# Patient Record
Sex: Female | Born: 1935
Health system: Southern US, Community
[De-identification: ages and names within clinical notes are randomized; demographics above are authoritative.]

## PROBLEM LIST (undated history)

## (undated) DIAGNOSIS — R7989 Other specified abnormal findings of blood chemistry: Secondary | ICD-10-CM

## (undated) DIAGNOSIS — M949 Disorder of cartilage, unspecified: Secondary | ICD-10-CM

## (undated) DIAGNOSIS — M899 Disorder of bone, unspecified: Secondary | ICD-10-CM

## (undated) DIAGNOSIS — E785 Hyperlipidemia, unspecified: Secondary | ICD-10-CM

## (undated) DIAGNOSIS — F329 Major depressive disorder, single episode, unspecified: Secondary | ICD-10-CM

## (undated) DIAGNOSIS — I729 Aneurysm of unspecified site: Secondary | ICD-10-CM

## (undated) DIAGNOSIS — D649 Anemia, unspecified: Secondary | ICD-10-CM

## (undated) DIAGNOSIS — C4490 Unspecified malignant neoplasm of skin, unspecified: Secondary | ICD-10-CM

## (undated) DIAGNOSIS — J984 Other disorders of lung: Secondary | ICD-10-CM

## (undated) DIAGNOSIS — E039 Hypothyroidism, unspecified: Secondary | ICD-10-CM

## (undated) DIAGNOSIS — I728 Aneurysm of other specified arteries: Secondary | ICD-10-CM

## (undated) HISTORY — DX: Aneurysm of other specified arteries: I72.8

## (undated) HISTORY — DX: Disorder of bone, unspecified: M89.9

## (undated) HISTORY — DX: Other specified abnormal findings of blood chemistry: R79.89

## (undated) HISTORY — DX: Disorder of cartilage, unspecified: M94.9

## (undated) HISTORY — DX: Aneurysm of unspecified site: I72.9

## (undated) HISTORY — DX: Anemia, unspecified: D64.9

## (undated) HISTORY — DX: Other disorders of lung: J98.4

## (undated) HISTORY — DX: Hypothyroidism, unspecified: E03.9

## (undated) HISTORY — DX: Major depressive disorder, single episode, unspecified: F32.9

## (undated) HISTORY — PX: ABDOMINAL HYSTERECTOMY: SHX81

## (undated) HISTORY — DX: Hyperlipidemia, unspecified: E78.5

## (undated) HISTORY — PX: OOPHORECTOMY: SHX86

## (undated) HISTORY — DX: Unspecified malignant neoplasm of skin, unspecified: C44.90

---

## 2001-12-12 ENCOUNTER — Ambulatory Visit (HOSPITAL_COMMUNITY): Admission: RE | Admit: 2001-12-12 | Discharge: 2001-12-12 | Payer: Self-pay | Admitting: Internal Medicine

## 2001-12-12 ENCOUNTER — Encounter: Payer: Self-pay | Admitting: Internal Medicine

## 2002-08-17 ENCOUNTER — Encounter: Admission: RE | Admit: 2002-08-17 | Discharge: 2002-08-17 | Payer: Self-pay | Admitting: *Deleted

## 2002-08-17 ENCOUNTER — Encounter: Payer: Self-pay | Admitting: *Deleted

## 2002-09-21 ENCOUNTER — Encounter: Admission: RE | Admit: 2002-09-21 | Discharge: 2002-09-21 | Payer: Self-pay | Admitting: Internal Medicine

## 2002-09-21 ENCOUNTER — Encounter: Payer: Self-pay | Admitting: Internal Medicine

## 2003-04-05 ENCOUNTER — Encounter: Admission: RE | Admit: 2003-04-05 | Discharge: 2003-04-05 | Payer: Self-pay | Admitting: *Deleted

## 2003-04-05 ENCOUNTER — Encounter: Payer: Self-pay | Admitting: *Deleted

## 2003-10-11 ENCOUNTER — Encounter: Admission: RE | Admit: 2003-10-11 | Discharge: 2003-10-11 | Payer: Self-pay | Admitting: *Deleted

## 2004-09-08 ENCOUNTER — Ambulatory Visit (HOSPITAL_COMMUNITY): Admission: RE | Admit: 2004-09-08 | Discharge: 2004-09-08 | Payer: Self-pay | Admitting: Internal Medicine

## 2004-10-20 ENCOUNTER — Encounter: Admission: RE | Admit: 2004-10-20 | Discharge: 2004-10-20 | Payer: Self-pay | Admitting: Internal Medicine

## 2004-11-06 ENCOUNTER — Ambulatory Visit: Payer: Self-pay | Admitting: Internal Medicine

## 2005-01-16 ENCOUNTER — Ambulatory Visit: Payer: Self-pay | Admitting: Internal Medicine

## 2005-02-13 ENCOUNTER — Ambulatory Visit: Payer: Self-pay | Admitting: Internal Medicine

## 2005-06-07 ENCOUNTER — Ambulatory Visit: Payer: Self-pay | Admitting: Internal Medicine

## 2005-06-12 ENCOUNTER — Ambulatory Visit: Payer: Self-pay | Admitting: Cardiology

## 2005-11-02 ENCOUNTER — Ambulatory Visit: Payer: Self-pay | Admitting: Internal Medicine

## 2006-01-31 ENCOUNTER — Ambulatory Visit: Payer: Self-pay | Admitting: Internal Medicine

## 2006-04-03 ENCOUNTER — Ambulatory Visit: Payer: Self-pay | Admitting: Internal Medicine

## 2006-04-17 ENCOUNTER — Ambulatory Visit: Payer: Self-pay | Admitting: Internal Medicine

## 2006-08-30 ENCOUNTER — Ambulatory Visit: Payer: Self-pay | Admitting: Internal Medicine

## 2006-08-30 ENCOUNTER — Encounter: Payer: Self-pay | Admitting: Internal Medicine

## 2006-08-30 DIAGNOSIS — E039 Hypothyroidism, unspecified: Secondary | ICD-10-CM

## 2006-08-30 DIAGNOSIS — I729 Aneurysm of unspecified site: Secondary | ICD-10-CM

## 2006-08-30 DIAGNOSIS — M899 Disorder of bone, unspecified: Secondary | ICD-10-CM

## 2006-08-30 DIAGNOSIS — M949 Disorder of cartilage, unspecified: Secondary | ICD-10-CM

## 2006-08-30 HISTORY — DX: Disorder of bone, unspecified: M89.9

## 2006-08-30 HISTORY — DX: Aneurysm of unspecified site: I72.9

## 2006-08-30 HISTORY — DX: Hypothyroidism, unspecified: E03.9

## 2006-10-07 ENCOUNTER — Ambulatory Visit: Payer: Self-pay | Admitting: Internal Medicine

## 2006-11-04 ENCOUNTER — Ambulatory Visit: Payer: Self-pay | Admitting: Internal Medicine

## 2006-11-04 LAB — CONVERTED CEMR LAB
ALT: 13 units/L (ref 0–40)
AST: 17 units/L (ref 0–37)
Albumin: 3.5 g/dL (ref 3.5–5.2)
Alkaline Phosphatase: 72 units/L (ref 39–117)
BUN: 10 mg/dL (ref 6–23)
Bilirubin, Direct: 0.1 mg/dL (ref 0.0–0.3)
CO2: 28 meq/L (ref 19–32)
Calcium: 9.1 mg/dL (ref 8.4–10.5)
Chloride: 108 meq/L (ref 96–112)
Creatinine, Ser: 0.6 mg/dL (ref 0.4–1.2)
GFR calc non Af Amer: 103 mL/min
Glomerular Filtration Rate, Af Am: 125 mL/min/{1.73_m2}
Glucose, Bld: 107 mg/dL — ABNORMAL HIGH (ref 70–99)
Potassium: 3.7 meq/L (ref 3.5–5.1)
Sodium: 141 meq/L (ref 135–145)
TSH: 1.23 microintl units/mL (ref 0.35–5.50)
Total Bilirubin: 0.6 mg/dL (ref 0.3–1.2)
Total Protein: 6.4 g/dL (ref 6.0–8.3)

## 2007-01-08 ENCOUNTER — Ambulatory Visit: Payer: Self-pay | Admitting: Internal Medicine

## 2007-01-20 ENCOUNTER — Ambulatory Visit: Payer: Self-pay | Admitting: Internal Medicine

## 2007-04-02 ENCOUNTER — Ambulatory Visit: Payer: Self-pay | Admitting: Internal Medicine

## 2007-04-02 ENCOUNTER — Encounter: Payer: Self-pay | Admitting: Internal Medicine

## 2007-05-05 ENCOUNTER — Ambulatory Visit: Payer: Self-pay | Admitting: Internal Medicine

## 2007-05-05 DIAGNOSIS — F3289 Other specified depressive episodes: Secondary | ICD-10-CM

## 2007-05-05 DIAGNOSIS — F32A Depression, unspecified: Secondary | ICD-10-CM | POA: Insufficient documentation

## 2007-05-05 DIAGNOSIS — F329 Major depressive disorder, single episode, unspecified: Secondary | ICD-10-CM

## 2007-05-05 DIAGNOSIS — R7989 Other specified abnormal findings of blood chemistry: Secondary | ICD-10-CM

## 2007-05-05 HISTORY — DX: Other specified abnormal findings of blood chemistry: R79.89

## 2007-05-05 HISTORY — DX: Major depressive disorder, single episode, unspecified: F32.9

## 2007-05-05 HISTORY — DX: Other specified depressive episodes: F32.89

## 2007-05-05 LAB — CONVERTED CEMR LAB
ALT: 15 units/L (ref 0–40)
AST: 19 units/L (ref 0–37)
Albumin: 3.7 g/dL (ref 3.5–5.2)
Alkaline Phosphatase: 69 units/L (ref 39–117)
BUN: 15 mg/dL (ref 6–23)
Bilirubin, Direct: 0.1 mg/dL (ref 0.0–0.3)
CO2: 30 meq/L (ref 19–32)
Calcium: 9 mg/dL (ref 8.4–10.5)
Chloride: 110 meq/L (ref 96–112)
Creatinine, Ser: 0.6 mg/dL (ref 0.4–1.2)
GFR calc Af Amer: 125 mL/min
GFR calc non Af Amer: 103 mL/min
Glucose, Bld: 93 mg/dL (ref 70–99)
Potassium: 3.7 meq/L (ref 3.5–5.1)
Sodium: 143 meq/L (ref 135–145)
TSH: 0.29 microintl units/mL — ABNORMAL LOW (ref 0.35–5.50)
Total Bilirubin: 0.7 mg/dL (ref 0.3–1.2)
Total Protein: 6.3 g/dL (ref 6.0–8.3)

## 2007-09-04 ENCOUNTER — Ambulatory Visit: Payer: Self-pay | Admitting: Internal Medicine

## 2008-01-12 ENCOUNTER — Ambulatory Visit: Payer: Self-pay | Admitting: Internal Medicine

## 2008-01-15 ENCOUNTER — Ambulatory Visit: Payer: Self-pay | Admitting: Internal Medicine

## 2008-01-15 DIAGNOSIS — E785 Hyperlipidemia, unspecified: Secondary | ICD-10-CM

## 2008-01-15 DIAGNOSIS — D649 Anemia, unspecified: Secondary | ICD-10-CM | POA: Insufficient documentation

## 2008-01-15 HISTORY — DX: Anemia, unspecified: D64.9

## 2008-01-15 HISTORY — DX: Hyperlipidemia, unspecified: E78.5

## 2008-01-15 LAB — CONVERTED CEMR LAB
ALT: 15 units/L (ref 0–35)
AST: 20 units/L (ref 0–37)
Albumin: 3.9 g/dL (ref 3.5–5.2)
Alkaline Phosphatase: 72 units/L (ref 39–117)
BUN: 14 mg/dL (ref 6–23)
Basophils Absolute: 0 10*3/uL (ref 0.0–0.1)
Basophils Relative: 0.5 % (ref 0.0–1.0)
Bilirubin, Direct: 0.1 mg/dL (ref 0.0–0.3)
Blood in Urine, dipstick: NEGATIVE
CO2: 27 meq/L (ref 19–32)
Calcium: 9.1 mg/dL (ref 8.4–10.5)
Chloride: 103 meq/L (ref 96–112)
Cholesterol: 175 mg/dL (ref 0–200)
Creatinine, Ser: 0.7 mg/dL (ref 0.4–1.2)
Eosinophils Absolute: 0.1 10*3/uL (ref 0.0–0.6)
Eosinophils Relative: 2.6 % (ref 0.0–5.0)
GFR calc Af Amer: 104 mL/min
GFR calc non Af Amer: 86 mL/min
Glucose, Bld: 87 mg/dL (ref 70–99)
Glucose, Urine, Semiquant: NEGATIVE
HCT: 38.7 % (ref 36.0–46.0)
HDL: 28.5 mg/dL — ABNORMAL LOW (ref >39.0)
Hemoglobin: 12.8 g/dL (ref 12.0–15.0)
Ketones, urine, test strip: NEGATIVE
LDL Cholesterol: 126 mg/dL — ABNORMAL HIGH (ref 0–99)
Lymphocytes Relative: 40.4 % (ref 12.0–46.0)
MCHC: 33 g/dL (ref 30.0–36.0)
MCV: 92.3 fL (ref 78.0–100.0)
Monocytes Absolute: 0.6 10*3/uL (ref 0.2–0.7)
Monocytes Relative: 10 % (ref 3.0–11.0)
Neutro Abs: 2.6 10*3/uL (ref 1.4–7.7)
Neutrophils Relative %: 46.5 % (ref 43.0–77.0)
Nitrite: NEGATIVE
Platelets: 219 10*3/uL (ref 150–400)
Potassium: 4 meq/L (ref 3.5–5.1)
Protein, U semiquant: NEGATIVE
RBC: 4.19 M/uL (ref 3.87–5.11)
RDW: 11.9 % (ref 11.5–14.6)
Sodium: 139 meq/L (ref 135–145)
Specific Gravity, Urine: 1.025
TSH: 0.44 microintl units/mL (ref 0.35–5.50)
Total Bilirubin: 0.6 mg/dL (ref 0.3–1.2)
Total CHOL/HDL Ratio: 6.1
Total Protein: 6.5 g/dL (ref 6.0–8.3)
Triglycerides: 103 mg/dL (ref 0–149)
Urobilinogen, UA: 0.2
VLDL: 21 mg/dL (ref 0–40)
WBC Urine, dipstick: NEGATIVE
WBC: 5.6 10*3/uL (ref 4.5–10.5)
pH: 6

## 2008-01-22 ENCOUNTER — Ambulatory Visit: Payer: Self-pay | Admitting: Internal Medicine

## 2008-01-22 LAB — HM MAMMOGRAPHY

## 2008-01-22 LAB — HM COLONOSCOPY

## 2008-01-22 LAB — CONVERTED CEMR LAB

## 2008-03-22 ENCOUNTER — Ambulatory Visit: Payer: Self-pay | Admitting: Internal Medicine

## 2008-05-24 ENCOUNTER — Ambulatory Visit: Payer: Self-pay | Admitting: Internal Medicine

## 2008-06-29 ENCOUNTER — Ambulatory Visit: Payer: Self-pay | Admitting: Internal Medicine

## 2008-07-01 LAB — CONVERTED CEMR LAB
ALT: 18 units/L (ref 0–35)
AST: 23 units/L (ref 0–37)
Albumin: 4 g/dL (ref 3.5–5.2)
Alkaline Phosphatase: 74 units/L (ref 39–117)
Bilirubin, Direct: 0.1 mg/dL (ref 0.0–0.3)
Cholesterol: 210 mg/dL (ref 0–200)
Direct LDL: 148 mg/dL
HDL: 37.2 mg/dL — ABNORMAL LOW (ref >39.0)
TSH: 0.56 microintl units/mL (ref 0.35–5.50)
Total Bilirubin: 0.8 mg/dL (ref 0.3–1.2)
Total CHOL/HDL Ratio: 5.6
Total Protein: 6.9 g/dL (ref 6.0–8.3)
Triglycerides: 165 mg/dL — ABNORMAL HIGH (ref 0–149)
VLDL: 33 mg/dL (ref 0–40)

## 2008-08-16 ENCOUNTER — Ambulatory Visit: Payer: Self-pay | Admitting: Internal Medicine

## 2008-10-11 ENCOUNTER — Ambulatory Visit: Payer: Self-pay | Admitting: Internal Medicine

## 2008-12-07 ENCOUNTER — Ambulatory Visit: Payer: Self-pay | Admitting: Internal Medicine

## 2009-01-04 ENCOUNTER — Ambulatory Visit: Payer: Self-pay | Admitting: Internal Medicine

## 2009-01-04 LAB — CONVERTED CEMR LAB
ALT: 15 units/L (ref 0–35)
AST: 18 units/L (ref 0–37)
Albumin: 3.9 g/dL (ref 3.5–5.2)
Alkaline Phosphatase: 67 units/L (ref 39–117)
Bilirubin, Direct: 0.1 mg/dL (ref 0.0–0.3)
Cholesterol: 190 mg/dL (ref 0–200)
HDL: 33.9 mg/dL — ABNORMAL LOW (ref >39.0)
LDL Cholesterol: 127 mg/dL — ABNORMAL HIGH (ref 0–99)
TSH: 0.71 microintl units/mL (ref 0.35–5.50)
Total Bilirubin: 0.9 mg/dL (ref 0.3–1.2)
Total CHOL/HDL Ratio: 5.6
Total Protein: 6.6 g/dL (ref 6.0–8.3)
Triglycerides: 147 mg/dL (ref 0–149)
VLDL: 29 mg/dL (ref 0–40)

## 2009-01-10 ENCOUNTER — Ambulatory Visit: Payer: Self-pay | Admitting: Internal Medicine

## 2009-02-16 ENCOUNTER — Emergency Department (HOSPITAL_COMMUNITY): Admission: EM | Admit: 2009-02-16 | Discharge: 2009-02-16 | Payer: Self-pay | Admitting: Emergency Medicine

## 2009-02-17 ENCOUNTER — Ambulatory Visit: Payer: Self-pay | Admitting: Internal Medicine

## 2009-02-17 DIAGNOSIS — J984 Other disorders of lung: Secondary | ICD-10-CM

## 2009-02-17 HISTORY — DX: Other disorders of lung: J98.4

## 2009-02-18 ENCOUNTER — Ambulatory Visit: Payer: Self-pay | Admitting: Internal Medicine

## 2009-02-18 LAB — CONVERTED CEMR LAB: BUN: 10 mg/dL (ref 6–23)

## 2009-02-22 ENCOUNTER — Ambulatory Visit: Payer: Self-pay | Admitting: Cardiology

## 2009-05-09 ENCOUNTER — Ambulatory Visit: Payer: Self-pay | Admitting: Internal Medicine

## 2009-05-11 ENCOUNTER — Encounter: Payer: Self-pay | Admitting: Internal Medicine

## 2009-05-11 ENCOUNTER — Ambulatory Visit: Payer: Self-pay | Admitting: Internal Medicine

## 2009-09-05 ENCOUNTER — Ambulatory Visit: Payer: Self-pay | Admitting: Internal Medicine

## 2009-09-05 LAB — CONVERTED CEMR LAB
Basophils Absolute: 0 10*3/uL (ref 0.0–0.1)
Eosinophils Absolute: 0.1 10*3/uL (ref 0.0–0.7)
Lymphocytes Relative: 36.2 % (ref 12.0–46.0)
MCHC: 34.8 g/dL (ref 30.0–36.0)
Monocytes Absolute: 0.5 10*3/uL (ref 0.1–1.0)
Neutro Abs: 2.9 10*3/uL (ref 1.4–7.7)
Neutrophils Relative %: 52.7 % (ref 43.0–77.0)
RDW: 12.2 % (ref 11.5–14.6)

## 2009-10-19 ENCOUNTER — Ambulatory Visit: Payer: Self-pay | Admitting: Internal Medicine

## 2009-12-07 ENCOUNTER — Ambulatory Visit: Payer: Self-pay | Admitting: Internal Medicine

## 2010-02-24 ENCOUNTER — Ambulatory Visit: Payer: Self-pay | Admitting: Internal Medicine

## 2010-02-28 ENCOUNTER — Encounter: Admission: RE | Admit: 2010-02-28 | Discharge: 2010-03-29 | Payer: Self-pay | Admitting: Internal Medicine

## 2010-03-02 ENCOUNTER — Encounter: Payer: Self-pay | Admitting: Internal Medicine

## 2010-03-28 ENCOUNTER — Encounter: Payer: Self-pay | Admitting: Internal Medicine

## 2010-05-11 ENCOUNTER — Ambulatory Visit: Payer: Self-pay | Admitting: Family Medicine

## 2010-05-12 LAB — CONVERTED CEMR LAB
BUN: 9 mg/dL (ref 6–23)
Calcium: 9.3 mg/dL (ref 8.4–10.5)
GFR calc non Af Amer: 108.42 mL/min (ref >60)
Potassium: 4.7 meq/L (ref 3.5–5.1)
Sodium: 143 meq/L (ref 135–145)

## 2010-05-16 ENCOUNTER — Ambulatory Visit: Payer: Self-pay | Admitting: Cardiology

## 2010-07-24 ENCOUNTER — Ambulatory Visit: Payer: Self-pay | Admitting: Internal Medicine

## 2010-07-24 LAB — CONVERTED CEMR LAB
Basophils Relative: 0.2 % (ref 0.0–3.0)
Eosinophils Absolute: 0.1 10*3/uL (ref 0.0–0.7)
HCT: 36.3 % (ref 36.0–46.0)
MCHC: 34.5 g/dL (ref 30.0–36.0)
MCV: 91.7 fL (ref 78.0–100.0)
Monocytes Absolute: 0.5 10*3/uL (ref 0.1–1.0)
Neutro Abs: 2.2 10*3/uL (ref 1.4–7.7)
RDW: 13.2 % (ref 11.5–14.6)
WBC: 4.9 10*3/uL (ref 4.5–10.5)

## 2010-08-08 ENCOUNTER — Ambulatory Visit: Payer: Self-pay | Admitting: Internal Medicine

## 2010-11-28 ENCOUNTER — Emergency Department (HOSPITAL_COMMUNITY)
Admission: EM | Admit: 2010-11-28 | Discharge: 2010-11-29 | Payer: Self-pay | Source: Home / Self Care | Admitting: Emergency Medicine

## 2010-11-29 LAB — URINE MICROSCOPIC-ADD ON

## 2010-11-29 LAB — CBC
HCT: 36.6 % (ref 36.0–46.0)
Hemoglobin: 12.3 g/dL (ref 12.0–15.0)
MCH: 30.9 pg (ref 26.0–34.0)
MCHC: 33.6 g/dL (ref 30.0–36.0)
MCV: 92 fL (ref 78.0–100.0)
Platelets: 208 10*3/uL (ref 150–400)
RBC: 3.98 MIL/uL (ref 3.87–5.11)
RDW: 12.5 % (ref 11.5–15.5)
WBC: 8.9 10*3/uL (ref 4.0–10.5)

## 2010-11-29 LAB — COMPREHENSIVE METABOLIC PANEL
ALT: 13 U/L (ref 0–35)
AST: 19 U/L (ref 0–37)
Albumin: 3.9 g/dL (ref 3.5–5.2)
Alkaline Phosphatase: 80 U/L (ref 39–117)
BUN: 11 mg/dL (ref 6–23)
CO2: 25 mEq/L (ref 19–32)
Calcium: 8.9 mg/dL (ref 8.4–10.5)
Chloride: 101 mEq/L (ref 96–112)
Creatinine, Ser: 0.54 mg/dL (ref 0.4–1.2)
GFR calc Af Amer: >60 mL/min (ref >60)
GFR calc non Af Amer: >60 mL/min (ref >60)
Glucose, Bld: 108 mg/dL — ABNORMAL HIGH (ref 70–99)
Potassium: 3.4 mEq/L — ABNORMAL LOW (ref 3.5–5.1)
Sodium: 135 mEq/L (ref 135–145)
Total Bilirubin: 1.1 mg/dL (ref 0.3–1.2)
Total Protein: 7.2 g/dL (ref 6.0–8.3)

## 2010-11-29 LAB — URINALYSIS, ROUTINE W REFLEX MICROSCOPIC
Bilirubin Urine: NEGATIVE
Ketones, ur: NEGATIVE mg/dL
Nitrite: NEGATIVE
Protein, ur: NEGATIVE mg/dL
Specific Gravity, Urine: 1.011 (ref 1.005–1.030)
Urine Glucose, Fasting: NEGATIVE mg/dL
Urobilinogen, UA: 2 mg/dL — ABNORMAL HIGH (ref 0.0–1.0)
pH: 6 (ref 5.0–8.0)

## 2010-11-29 LAB — DIFFERENTIAL
Basophils Absolute: 0 10*3/uL (ref 0.0–0.1)
Basophils Relative: 0 % (ref 0–1)
Eosinophils Absolute: 0.2 10*3/uL (ref 0.0–0.7)
Eosinophils Relative: 2 % (ref 0–5)
Lymphocytes Relative: 16 % (ref 12–46)
Lymphs Abs: 1.4 10*3/uL (ref 0.7–4.0)
Monocytes Absolute: 1.1 10*3/uL — ABNORMAL HIGH (ref 0.1–1.0)
Monocytes Relative: 12 % (ref 3–12)
Neutro Abs: 6.3 10*3/uL (ref 1.7–7.7)
Neutrophils Relative %: 70 % (ref 43–77)

## 2010-11-30 ENCOUNTER — Ambulatory Visit
Admission: RE | Admit: 2010-11-30 | Discharge: 2010-11-30 | Payer: Self-pay | Source: Home / Self Care | Attending: Internal Medicine | Admitting: Internal Medicine

## 2010-12-11 ENCOUNTER — Telehealth: Payer: Self-pay | Admitting: Internal Medicine

## 2010-12-13 ENCOUNTER — Ambulatory Visit
Admission: RE | Admit: 2010-12-13 | Discharge: 2010-12-13 | Payer: Self-pay | Source: Home / Self Care | Attending: Internal Medicine | Admitting: Internal Medicine

## 2010-12-16 ENCOUNTER — Encounter: Payer: Self-pay | Admitting: Internal Medicine

## 2010-12-17 ENCOUNTER — Encounter: Payer: Self-pay | Admitting: *Deleted

## 2010-12-23 ENCOUNTER — Encounter: Payer: Self-pay | Admitting: Internal Medicine

## 2010-12-26 NOTE — Assessment & Plan Note (Signed)
Summary: MOLE REMOVAL/NJR   Procedure Note  Cyst Removal: Onset of lesion: several months Indication: suspicious lesion  Procedure #1: elliptical incision and removal w/blunt dissection    Size (in cm): 1.2 x 1.0    Location: left hand/wrist    Comment: verbal consent    Instrument used: #10 blade    Anesthesia: 1% lidocaine w/epinephrine   Allergies: 1)  Soma (Carisoprodol) 2)  Flexeril (Cyclobenzaprine Hcl) 3)  Penicillin G Potassium (Penicillin G Potassium)   Complete Medication List: 1)  Levothyroxine Sodium 75 Mcg Tabs (Levothyroxine sodium) .Marland Kitchen.. 1 by mouth once daily 2)  Boniva 150 Mg Tabs (Ibandronate sodium) .Marland Kitchen.. 1 by mouth q month 3)  Tylenol Arthritis Pain 650 Mg Tbcr (Acetaminophen) .... As needed 4)  Fluticasone Propionate 50 Mcg/act Susp (Fluticasone propionate) .... 2 sprays each nostril once daily 5)  Azelastine Hcl 137 Mcg/spray Soln (Azelastine hcl) .... 2 sprays each nostril daily 6)  Meloxicam 7.5 Mg Tabs (Meloxicam) .... One by mouth daily with food 7)  Vitamin D (ergocalciferol) 50000 Unit Caps (Ergocalciferol) .... Take one capsule once a week x 12 weeks--call for lab appt for 4 weeks after completion  Other Orders: Excise Malig lesion ([facility]) 0.6 - 1.0 cm (29562)

## 2010-12-26 NOTE — Assessment & Plan Note (Signed)
Summary: COUGH/CONGESTION/NJR   Vital Signs:  Patient profile:   77 year old female Weight:      138 pounds O2 Sat:      97 % on Room air Temp:     98.5 degrees F BP sitting:   148 / 78  (left arm) Cuff size:   regular  Vitals Entered By: Raechel Ache, RN (December 07, 2009 11:52 AM)  O2 Flow:  Room air CC: C/o dry cough, chest and back aches.   CC:  C/o dry cough and chest and back aches..  History of Present Illness: 77 year old patient who is seen today with a two-day history of largely nonproductive cough.  Denies any sputum production, fever or chills.  She has noted some mild chest discomfort, associated with coughing.  No shortness of breath.  O2 saturation today 97%.  She has a history of osteopenia hypothyroidism.  Is also treated for allergic rhinitis  Allergies: 1)  Soma (Carisoprodol) 2)  Flexeril (Cyclobenzaprine Hcl) 3)  Penicillin G Potassium (Penicillin G Potassium)  Past History:  Past Medical History: Reviewed history from 02/17/2009 and no changes required. Hypothyroidism Osteopenia splenic artery aneurysm Depression pulmonary nodule:  March 2010  Review of Systems       The patient complains of prolonged cough.  The patient denies anorexia, fever, weight loss, weight gain, vision loss, decreased hearing, hoarseness, chest pain, syncope, dyspnea on exertion, peripheral edema, headaches, hemoptysis, abdominal pain, melena, hematochezia, severe indigestion/heartburn, hematuria, incontinence, genital sores, muscle weakness, suspicious skin lesions, transient blindness, difficulty walking, depression, unusual weight change, abnormal bleeding, enlarged lymph nodes, angioedema, and breast masses.    Physical Exam  General:  Well-developed,well-nourished,in no acute distress; alert,appropriate and cooperative throughout examination; normal blood pressure, no distress Head:  Normocephalic and atraumatic without obvious abnormalities. No apparent alopecia or  balding. Eyes:  No corneal or conjunctival inflammation noted. EOMI. Perrla. Funduscopic exam benign, without hemorrhages, exudates or papilledema. Vision grossly normal. Ears:  External ear exam shows no significant lesions or deformities.  Otoscopic examination reveals clear canals, tympanic membranes are intact bilaterally without bulging, retraction, inflammation or discharge. Hearing is grossly normal bilaterally. Mouth:  Oral mucosa and oropharynx without lesions or exudates.  Teeth in good repair. Neck:  No deformities, masses, or tenderness noted. Lungs:  Normal respiratory effort, chest expands symmetrically. Lungs are clear to auscultation, no crackles or wheezes.  frequent paroxysms of coughing O2 saturation 97% Heart:  Normal rate and regular rhythm. S1 and S2 normal without gallop, murmur, click, rub or other extra sounds. Abdomen:  Bowel sounds positive,abdomen soft and non-tender without masses, organomegaly or hernias noted. Msk:  No deformity or scoliosis noted of thoracic or lumbar spine.   Extremities:  No clubbing, cyanosis, edema, or deformity noted with normal full range of motion of all joints.     Impression & Recommendations:  Problem # 1:  URI (ICD-465.9)  Her updated medication list for this problem includes:    Tylenol Arthritis Pain 650 Mg Tbcr (Acetaminophen) .Marland Kitchen... As needed    Hydrocodone-homatropine 5-1.5 Mg/24ml Syrp (Hydrocodone-homatropine) ..... One half to 1 teaspoon every 6 hours as needed for cough  Her updated medication list for this problem includes:    Tylenol Arthritis Pain 650 Mg Tbcr (Acetaminophen) .Marland Kitchen... As needed    Hydrocodone-homatropine 5-1.5 Mg/65ml Syrp (Hydrocodone-homatropine) ..... One half to 1 teaspoon every 6 hours as needed for cough  Problem # 2:  HYPOTHYROIDISM (ICD-244.9)  Her updated medication list for this problem includes:  Levothyroxine Sodium 75 Mcg Tabs (Levothyroxine sodium) .Marland Kitchen... 1 by mouth once daily  Her updated  medication list for this problem includes:    Levothyroxine Sodium 75 Mcg Tabs (Levothyroxine sodium) .Marland Kitchen... 1 by mouth once daily  Complete Medication List: 1)  Levothyroxine Sodium 75 Mcg Tabs (Levothyroxine sodium) .Marland Kitchen.. 1 by mouth once daily 2)  Boniva 150 Mg Tabs (Ibandronate sodium) .Marland Kitchen.. 1 by mouth q month 3)  Tylenol Arthritis Pain 650 Mg Tbcr (Acetaminophen) .... As needed 4)  Fluticasone Propionate 50 Mcg/act Susp (Fluticasone propionate) .... 2 sprays each nostril once daily 5)  Azelastine Hcl 137 Mcg/spray Soln (Azelastine hcl) .... 2 sprays each nostril daily 6)  Hydrocodone-homatropine 5-1.5 Mg/36ml Syrp (Hydrocodone-homatropine) .... One half to 1 teaspoon every 6 hours as needed for cough  Patient Instructions: 1)  Get plenty of rest, drink lots of clear liquids, and use Tylenol or Ibuprofen for fever and comfort. Return in 7-10 days if you're not better:sooner if you're feeling worse. Prescriptions: HYDROCODONE-HOMATROPINE 5-1.5 MG/5ML SYRP (HYDROCODONE-HOMATROPINE) one half to 1 teaspoon every 6 hours as needed for cough  #6 oz x 0   Entered and Authorized by:   Gordy Savers  MD   Signed by:   Gordy Savers  MD on 12/07/2009   Method used:   Print then Give to Patient   RxID:   [id number]

## 2010-12-26 NOTE — Assessment & Plan Note (Signed)
Summary: R ARM PAIN / MUSCLE PAIN // RS   Vital Signs:  Patient profile:   77 year old female Weight:      138 pounds Temp:     97.4 degrees F oral Pulse rate:   64 / minute Pulse rhythm:   regular Resp:     12 per minute BP sitting:   146 / 80  (left arm) Cuff size:   regular  Vitals Entered By: Gladis Riffle, RN (February 24, 2010 10:41 AM) CC: c/o right upper  arm pain since 3/29 Is Patient Diabetic? No   CC:  c/o right upper  arm pain since 3/29.  History of Present Illness: right shoulder pain started 4 days ago---no trauma pain increases with any movement---especially abduction and internal rotation no swelling or erythema 6/10 pain  All other systems reviewed and were negative   Preventive Screening-Counseling & Management  Alcohol-Tobacco     Smoking Status: never  Current Problems (verified): 1)  Pulmonary Nodule, Solitary  (ICD-518.89) 2)  Anemia  (ICD-285.9) 3)  Hyperlipidemia  (ICD-272.4) 4)  Fasting Hyperglycemia  (ICD-790.6) 5)  Depression  (ICD-311) 6)  Vaccine Against Influenza  (ICD-V04.81) 7)  Aneurysm Nos  (ICD-442.9) 8)  Osteopenia  (ICD-733.90) 9)  Hypothyroidism  (ICD-244.9)  Current Medications (verified): 1)  Levothyroxine Sodium 75 Mcg Tabs (Levothyroxine Sodium) .Marland Kitchen.. 1 By Mouth Once Daily 2)  Boniva 150 Mg Tabs (Ibandronate Sodium) .Marland Kitchen.. 1 By Mouth Q Month 3)  Tylenol Arthritis Pain 650 Mg  Tbcr (Acetaminophen) .... As Needed 4)  Fluticasone Propionate 50 Mcg/act  Susp (Fluticasone Propionate) .... 2 Sprays Each Nostril Once Daily 5)  Azelastine Hcl 137 Mcg/spray Soln (Azelastine Hcl) .... 2 Sprays Each Nostril Daily  Allergies: 1)  Soma (Carisoprodol) 2)  Flexeril (Cyclobenzaprine Hcl) 3)  Penicillin G Potassium (Penicillin G Potassium)  Past History:  Past Medical History: Last updated: 02/17/2009 Hypothyroidism Osteopenia splenic artery aneurysm Depression pulmonary nodule:  March 2010  Past Surgical History: Last updated:  08/30/2006 Hysterectomy Oophorectomy  Family History: Last updated: 2007-06-04 mother deceased 16yo- "old age" father deceased DM-approx 79 Family History Diabetes 1st degree relative  Social History: Last updated: Jun 04, 2007 widowed Never Smoked Alcohol use-no Regular exercise-no  Risk Factors: Exercise: no (2007/06/04)  Risk Factors: Smoking Status: never (02/24/2010)  Physical Exam  General:  Well-developed,well-nourished,in no acute distress; alert,appropriate and cooperative throughout examination; normal blood pressure, no distress Head:  normocephalic and atraumatic.   Neck:  No deformities, masses, or tenderness noted. Lungs:  normal respiratory effort and no intercostal retractions.   Heart:  normal rate and regular rhythm.   Msk:  from left shoulder right shoulder: pain with abduction to 60 degress pain with internal and external rotation Neurologic:  cranial nerves II-XII intact and gait normal.     Impression & Recommendations:  Problem # 1:  SHOULDER PAIN (ICD-719.41) suspect RTC syndrome trial nsaid refer PT  side effects discussed Her updated medication list for this problem includes:    Tylenol Arthritis Pain 650 Mg Tbcr (Acetaminophen) .Marland Kitchen... As needed    Meloxicam 7.5 Mg Tabs (Meloxicam) ..... One by mouth daily with food  Orders: Physical Therapy Referral (PT) complains of sinus sxs continue fluticasone add otc claritin  Complete Medication List: 1)  Levothyroxine Sodium 75 Mcg Tabs (Levothyroxine sodium) .Marland Kitchen.. 1 by mouth once daily 2)  Boniva 150 Mg Tabs (Ibandronate sodium) .Marland Kitchen.. 1 by mouth q month 3)  Tylenol Arthritis Pain 650 Mg Tbcr (Acetaminophen) .... As needed 4)  Fluticasone Propionate 50 Mcg/act Susp (Fluticasone propionate) .... 2 sprays each nostril once daily 5)  Azelastine Hcl 137 Mcg/spray Soln (Azelastine hcl) .... 2 sprays each nostril daily 6)  Meloxicam 7.5 Mg Tabs (Meloxicam) .... One by mouth daily with  food   Patient Instructions: 1)  call if symptoms persist Prescriptions: MELOXICAM 7.5 MG  TABS (MELOXICAM) one by mouth daily with food  #30 x 0   Entered and Authorized by:   Birdie Sons MD   Signed by:   Birdie Sons MD on 02/24/2010   Method used:   Electronically to        [facility]  [phone]* (retail)       [address]       [place], [place]  [address]       Ph: [phone] or [phone]       Fax: [phone]   RxID:   [id number]

## 2010-12-26 NOTE — Assessment & Plan Note (Signed)
Summary: SINUSITIS // RS   Vital Signs:  Patient profile:   77 year old female Weight:      139 pounds Temp:     98.0 degrees F oral BP sitting:   138 / 60  (left arm) Cuff size:   regular  Vitals Entered By: Sid Falcon LPN (May 11, 2010 9:37 AM) CC: Sinusitis   History of Present Illness: Patient is here with 2 day history of sinus congestive symptoms and nonproductive cough. She has some bilateral maxillary sinus pressure. No fever. No purulent drainage. Flonase without much improvement. Has postnasal drip symptoms. Nonsmoker. Cough especially bothersome at night.  History pulmonary nodule noted on CT scan March 2010. Patient is equivocal regarding whether to get any followup studies. Denies any appetite changes, weight changes, dyspnea, or hemoptysis. Nonsmoker.  Allergies: 1)  Soma (Carisoprodol) 2)  Flexeril (Cyclobenzaprine Hcl) 3)  Penicillin G Potassium (Penicillin G Potassium)  Past History:  Past Medical History: Last updated: 02/17/2009 Hypothyroidism Osteopenia splenic artery aneurysm Depression pulmonary nodule:  March 2010  Past Surgical History: Last updated: 08/30/2006 Hysterectomy Oophorectomy  Family History: Last updated: 05/07/07 mother deceased 34yo- "old age" father deceased DM-approx 74 Family History Diabetes 1st degree relative  Social History: Last updated: 05-07-2007 widowed Never Smoked Alcohol use-no Regular exercise-no  Risk Factors: Exercise: no (2007-05-07)  Risk Factors: Smoking Status: never (02/24/2010)  Review of Systems  The patient denies anorexia, fever, weight loss, chest pain, syncope, dyspnea on exertion, peripheral edema, prolonged cough, headaches, hemoptysis, abdominal pain, melena, hematochezia, and severe indigestion/heartburn.    Physical Exam  General:  Well-developed,well-nourished,in no acute distress; alert,appropriate and cooperative throughout examination Head:  Normocephalic and atraumatic  without obvious abnormalities. No apparent alopecia or balding. Ears:  External ear exam shows no significant lesions or deformities.  Otoscopic examination reveals clear canals, tympanic membranes are intact bilaterally without bulging, retraction, inflammation or discharge. Hearing is grossly normal bilaterally. Nose:  minimal clear mucus otherwise clear Mouth:  Oral mucosa and oropharynx without lesions or exudates.  Teeth in good repair. Neck:  No deformities, masses, or tenderness noted. Lungs:  Normal respiratory effort, chest expands symmetrically. Lungs are clear to auscultation, no crackles or wheezes. Heart:  Normal rate and regular rhythm. S1 and S2 normal without gallop, murmur, click, rub or other extra sounds. Extremities:  No clubbing, cyanosis, edema, or deformity noted with normal full range of motion of all joints.     Impression & Recommendations:  Problem # 1:  VIRAL URI (ICD-465.9) Assessment New cough med prescribed. Her updated medication list for this problem includes:    Tylenol Arthritis Pain 650 Mg Tbcr (Acetaminophen) .Marland Kitchen... As needed    Meloxicam 7.5 Mg Tabs (Meloxicam) ..... One by mouth daily with food    Hydrocodone-homatropine 5-1.5 Mg/37ml Syrp (Hydrocodone-homatropine) ..... One tsp by mouth q 4-6 hours as needed cough  Problem # 2:  PULMONARY NODULE, SOLITARY (ICD-518.89) Discussion with daughter and patient. She has not any worrisome symptoms such as shortness of breath, prolonged cough, hemoptysis, or weight loss but they would like to proceed with followup imaging to reassess prior pulmonary nodule Orders: TLB-BMP (Basic Metabolic Panel-BMET) (80048-METABOL) Venipuncture (16109) Radiology Referral (Radiology)  Complete Medication List: 1)  Levothyroxine Sodium 75 Mcg Tabs (Levothyroxine sodium) .Marland Kitchen.. 1 by mouth once daily 2)  Boniva 150 Mg Tabs (Ibandronate sodium) .Marland Kitchen.. 1 by mouth q month 3)  Tylenol Arthritis Pain 650 Mg Tbcr (Acetaminophen) .... As  needed 4)  Fluticasone Propionate 50 Mcg/act Susp (Fluticasone  propionate) .... 2 sprays each nostril once daily 5)  Azelastine Hcl 137 Mcg/spray Soln (Azelastine hcl) .... 2 sprays each nostril daily 6)  Meloxicam 7.5 Mg Tabs (Meloxicam) .... One by mouth daily with food 7)  Hydrocodone-homatropine 5-1.5 Mg/68ml Syrp (Hydrocodone-homatropine) .... One tsp by mouth q 4-6 hours as needed cough  Patient Instructions: 1)  Get plenty of rest, drink lots of clear liquids, and use Tylenol or Ibuprofen for fever and comfort. Return in 7-10 days if you're not better: sooner if you'er feeling worse.  Prescriptions: HYDROCODONE-HOMATROPINE 5-1.5 MG/5ML SYRP (HYDROCODONE-HOMATROPINE) one tsp by mouth q 4-6 hours as needed cough  #120 ml x 0   Entered and Authorized by:   Evelena Peat MD   Signed by:   Evelena Peat MD on 05/11/2010   Method used:   Print then Give to Patient   RxID:   [id number]   Appended Document: SINUSITIS // RS BMP normal.

## 2010-12-26 NOTE — Miscellaneous (Signed)
Summary: Discharge Summary for PT [facility]  Discharge Summary for PT Services/[place]   Imported By: Maryln Gottron 04/05/2010 15:30:49  _____________________________________________________________________  External Attachment:    Type:   Image     Comment:   External Document

## 2010-12-26 NOTE — Assessment & Plan Note (Signed)
Summary: THYROID F/U // RS   Vital Signs:  Patient profile:   77 year old female Weight:      139 pounds Temp:     97.9 degrees F oral Pulse rate:   64 / minute Pulse rhythm:   regular Resp:     12 per minute BP sitting:   110 / 60  (left arm) Cuff size:   regular  Vitals Entered By: Gladis Riffle, RN (July 24, 2010 11:27 AM) CC: FU thyroid--needs refill levothyroxine (was done 07/17/10, but pharmacy told her she needed to see MD) Is Patient Diabetic? No   CC:  FU thyroid--needs refill levothyroxine (was done 07/17/10 and but pharmacy told her she needed to see MD).  History of Present Illness:  Follow-Up Visit      This is an 77 year old woman who presents for Follow-up visit.  The patient denies chest pain and palpitations.  Since the last visit the patient notes no new problems or concerns.  The patient reports taking meds as prescribed.  When questioned about possible medication side effects, the patient notes none.   She notes a painful, scaley rash, right forearm  hypothyroid---she needs Rx  All other systems reviewed and were negative   Preventive Screening-Counseling & Management  Alcohol-Tobacco     Smoking Status: never  Current Problems (verified): 1)  Pulmonary Nodule, Solitary  (ICD-518.89) 2)  Anemia  (ICD-285.9) 3)  Hyperlipidemia  (ICD-272.4) 4)  Fasting Hyperglycemia  (ICD-790.6) 5)  Depression  (ICD-311) 6)  Aneurysm Nos  (ICD-442.9) 7)  Osteopenia  (ICD-733.90) 8)  Hypothyroidism  (ICD-244.9)  Current Medications (verified): 1)  Levothyroxine Sodium 75 Mcg Tabs (Levothyroxine Sodium) .Marland Kitchen.. 1 By Mouth Once Daily 2)  Boniva 150 Mg Tabs (Ibandronate Sodium) .Marland Kitchen.. 1 By Mouth Q Month 3)  Tylenol Arthritis Pain 650 Mg  Tbcr (Acetaminophen) .... As Needed 4)  Fluticasone Propionate 50 Mcg/act  Susp (Fluticasone Propionate) .... 2 Sprays Each Nostril Once Daily 5)  Azelastine Hcl 137 Mcg/spray Soln (Azelastine Hcl) .... 2 Sprays Each Nostril Daily 6)   Meloxicam 7.5 Mg  Tabs (Meloxicam) .... One By Mouth Daily With Food  Allergies: 1)  Soma (Carisoprodol) 2)  Flexeril (Cyclobenzaprine Hcl) 3)  Penicillin G Potassium (Penicillin G Potassium)  Past History:  Past Medical History: Last updated: 02/17/2009 Hypothyroidism Osteopenia splenic artery aneurysm Depression pulmonary nodule:  March 2010  Past Surgical History: Last updated: 08/30/2006 Hysterectomy Oophorectomy  Family History: Last updated: Jun 02, 2007 mother deceased 54yo- "old age" father deceased DM-approx 69 Family History Diabetes 1st degree relative  Social History: Last updated: 06/02/2007 widowed Never Smoked Alcohol use-no Regular exercise-no  Risk Factors: Exercise: no (06-02-2007)  Risk Factors: Smoking Status: never (07/24/2010)  Physical Exam  General:  alert and well-developed.   Head:  normocephalic and atraumatic.   Eyes:  pupils equal and pupils round.   Ears:  R ear normal and L ear normal.   Neck:  No deformities, masses, or tenderness noted. Chest Wall:  No deformities, masses, or tenderness noted. Lungs:  normal respiratory effort and no intercostal retractions.   Heart:  normal rate and regular rhythm.   Abdomen:  soft and non-tender.   Msk:  No deformity or scoliosis noted of thoracic or lumbar spine.   Neurologic:  cranial nerves II-XII intact and gait normal.   Skin:  1 cm irregularly shaped scaly, erythematous area on right forearm.   Impression & Recommendations:  Problem # 1:  PULMONARY NODULE, SOLITARY (ICD-518.89) reviewed CT---Flag  sent in the future to reschedule CT  Problem # 2:  HYPERLIPIDEMIA (ICD-272.4) no meds---no need for f/u hdl is low Orders: Venipuncture (44034)  Labs Reviewed: SGOT: 18 (01/04/2009)   SGPT: 15 (01/04/2009)   HDL:33.9 (01/04/2009), 37.2 (06/29/2008)  LDL:127 (01/04/2009), DEL (06/29/2008)  Chol:190 (01/04/2009), 210 (06/29/2008)  Trig:147 (01/04/2009), 165 (06/29/2008)  Problem # 3:   HYPOTHYROIDISM (ICD-244.9) check labs today Her updated medication list for this problem includes:    Levothyroxine Sodium 75 Mcg Tabs (Levothyroxine sodium) .Marland Kitchen... 1 by mouth once daily  Orders: Venipuncture (74259) TLB-TSH (Thyroid Stimulating Hormone) (84443-TSH)  Labs Reviewed: TSH: 0.51 (09/05/2009)    Chol: 190 (01/04/2009)   HDL: 33.9 (01/04/2009)   LDL: 127 (01/04/2009)   TG: 147 (01/04/2009) squamous cell ca of arm---presumptive---schedule excision  Complete Medication List: 1)  Levothyroxine Sodium 75 Mcg Tabs (Levothyroxine sodium) .Marland Kitchen.. 1 by mouth once daily 2)  Boniva 150 Mg Tabs (Ibandronate sodium) .Marland Kitchen.. 1 by mouth q month 3)  Tylenol Arthritis Pain 650 Mg Tbcr (Acetaminophen) .... As needed 4)  Fluticasone Propionate 50 Mcg/act Susp (Fluticasone propionate) .... 2 sprays each nostril once daily 5)  Azelastine Hcl 137 Mcg/spray Soln (Azelastine hcl) .... 2 sprays each nostril daily 6)  Meloxicam 7.5 Mg Tabs (Meloxicam) .... One by mouth daily with food  Other Orders: Admin 1st Vaccine (56387) Flu Vaccine 71yrs + (56433) TLB-CBC Platelet - w/Differential (85025-CBCD) T-Vitamin D (25-Hydroxy) (29518-84166) Flu Vaccine Consent Questions     Do you have a history of severe allergic reactions to this vaccine? no    Any prior history of allergic reactions to egg and/or gelatin? no    Do you have a sensitivity to the preservative Thimersol? no    Do you have a past history of Guillan-Barre Syndrome? no    Do you currently have an acute febrile illness? no    Have you ever had a severe reaction to latex? no    Vaccine information given and explained to patient? yes    Are you currently pregnant? no    Lot Number:[id number]   Exp Date:05/26/2011   Site Given  Left Deltoid IMccine 33yrs + (06301)  Patient Instructions: 1)  Please schedule a follow-up appointment in 6 months. 2)  Schedule ov for mole removal (within the next 2-3 weeks) Prescriptions: LEVOTHYROXINE SODIUM 75  MCG TABS (LEVOTHYROXINE SODIUM) 1 by mouth once daily  #90 x 3   Entered and Authorized by:   Birdie Sons MD   Signed by:   Birdie Sons MD on 07/24/2010   Method used:   Electronically to        [facility]  [phone]* (retail)       [address]       [place], [place]  [address]       Ph: [phone] or [phone]       Fax: [phone]   RxID:   [id number]    .lbflu   Appended Document: Orders Update     Clinical Lists Changes  Orders: Added new Service order of Specimen Handling (50093) - Signed Added new Test order of TLB-CBC Platelet - w/Differential (85025-CBCD) - Signed

## 2010-12-26 NOTE — Miscellaneous (Signed)
Summary: Initial Summary for PT Services/[facility]  Initial Summary for PT Services/[facility]   Imported By: Maryln Gottron 03/07/2010 14:47:15  _____________________________________________________________________  External Attachment:    Type:   Image     Comment:   External Document

## 2010-12-28 ENCOUNTER — Ambulatory Visit (INDEPENDENT_AMBULATORY_CARE_PROVIDER_SITE_OTHER): Payer: MEDICARE | Admitting: Internal Medicine

## 2010-12-28 ENCOUNTER — Ambulatory Visit: Admit: 2010-12-28 | Payer: Self-pay | Admitting: Internal Medicine

## 2010-12-28 ENCOUNTER — Encounter: Payer: Self-pay | Admitting: Internal Medicine

## 2010-12-28 VITALS — BP 122/82 | HR 72 | Temp 97.9°F | Wt 137.0 lb

## 2010-12-28 DIAGNOSIS — M899 Disorder of bone, unspecified: Secondary | ICD-10-CM

## 2010-12-28 DIAGNOSIS — M949 Disorder of cartilage, unspecified: Secondary | ICD-10-CM

## 2010-12-28 DIAGNOSIS — J069 Acute upper respiratory infection, unspecified: Secondary | ICD-10-CM

## 2010-12-28 MED ORDER — HYDROCODONE-HOMATROPINE 5-1.5 MG/5ML PO SYRP: 2.5000 mL | ORAL_SOLUTION | Freq: Four times a day (QID) | ORAL | Status: DC | PRN

## 2010-12-28 NOTE — Assessment & Plan Note (Signed)
Summary: POST HOSP F/U, BACK PAIN // RS   Vital Signs:  Patient profile:   77 year old female Height:      62 inches Weight:      137 pounds BMI:     25.15 Temp:     98.7 degrees F oral Pulse rate:   70 / minute Pulse rhythm:   regular BP sitting:   116 / 72  (left arm) Cuff size:   regular  Vitals Entered By: Alfred Levins, CMA (December 13, 2010 8:56 AM) CC: mid back pain x2 wks, was seen in the ER after new years, she said it is relieved when she passes gas, Cough   CC:  mid back pain x2 wks, was seen in the ER after new years, she said it is relieved when she passes gas, and Cough.  History of Present Illness:  Cough      This is an 77 year old woman who presents with Cough.  The symptoms began 3 weeks ago.  The patient reports non-productive cough, but denies shortness of breath, fever, and hemoptysis.  Associated symtpoms include cold/URI symptoms, sore throat, and nasal congestion.  Ineffective prior treatments have included OTC cough medication.  Diagnostic testing to date has included CXR.    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 1 By Mouth Once Daily 2)  Boniva 150 Mg Tabs (Ibandronate Sodium) .Marland Kitchen.. 1 By Mouth Q Month 3)  Tylenol Arthritis Pain 650 Mg  Tbcr (Acetaminophen) .... As Needed  Allergies (verified): 1)  Soma (Carisoprodol) 2)  Flexeril (Cyclobenzaprine Hcl) 3)  Penicillin G Potassium (Penicillin G Potassium)  Past History:  Past Medical History: Last updated: 02/17/2009 Hypothyroidism Osteopenia splenic artery  aneurysm Depression pulmonary nodule:  March 2010  Past Surgical History: Last updated: 08/30/2006 Hysterectomy Oophorectomy  Family History: Last updated: May 08, 2007 mother deceased 83yo- "old age" father deceased DM-approx 62 Family History Diabetes 1st degree relative  Social History: Last updated: 2007/05/08 widowed Never Smoked Alcohol use-no Regular exercise-no  Risk Factors: Exercise: no (05/08/07)  Risk Factors: Smoking Status: never (07/24/2010)  Physical Exam  General:   elderly female in no acute distress. HEENT exam atraumatic, normocephalic  extraocular muscles are intact. Neck is supple. Chest clear to auscultation cardiac exam S1-S2 are regular. There is no edema. Neurologic exam she is alert with a normal gait.   Impression & Recommendations:  Problem # 1:  COUGH (ICD-786.2) suspect she is recovering from viral infection and has a post viral cough emperic mucinex and steroid taper see me 1-2 weeks reviewed with pt and dtr  I have also reviewed ED visit, CXR and lab work  Complete Medication List: 1)  Levothyroxine Sodium 75 Mcg Tabs (Levothyroxine sodium) .Marland Kitchen.. 1 by mouth once daily 2)  Boniva 150 Mg Tabs (Ibandronate sodium) .Marland Kitchen.. 1 by mouth q month 3)  Tylenol Arthritis Pain 650 Mg Tbcr (Acetaminophen) .... As needed 4)  Mucinex 600 Mg Xr12h-tab (Guaifenesin) .... Two times a day 5)  Prednisone 10 Mg Tabs (Prednisone) .... 3 po qd for 3 days, then 2 po qd for  3 days, then 1 po qd for 3 days, then 1/2 po qd for 3 days 6)  Simethicone 80 Mg Chew (Simethicone) .... Take 1 tablet by mouth three times a day as needed gas pains  Patient Instructions: 1)  see me 1-2 weeks Prescriptions: PREDNISONE 10 MG  TABS (PREDNISONE) 3 po qd for 3 days, then 2 po qd for 3 days, then 1 po qd for 3 days, then 1/2 po qd for 3 days  #25 x 0   Entered and Authorized by:   Birdie Sons MD   Signed by:   Birdie Sons MD on 12/13/2010   Method used:   Electronically to         [facility]  [phone]* (retail)       [address]       [place], [place]  [address]       Ph: [phone] or [phone]       Fax: [phone]   RxID:   [id number]    Orders Added: 1)  Est. Patient Level IV [61607]

## 2010-12-28 NOTE — Progress Notes (Signed)
Summary: REQUEST FOR APPT  Phone Note Call from Patient   Caller: Patient   [phone] Summary of Call: Pt called in to request that she be worked into schedule this week for post hosp f/u.... Pt was admitted for respiratory problems, also exp back pain.... Can you advise?  Initial call taken by: Debbra Riding,  December 11, 2010 4:09 PM  Follow-up for Phone Call        Had an appt come open - Pt was put in that slot -  scheduled for Wed. 1/19 at  9am..Marland KitchenMarland KitchenPt aware of same / acknowledged same.   Follow-up by: Debbra Riding,  December 11, 2010 4:26 PM

## 2010-12-28 NOTE — Assessment & Plan Note (Signed)
Summary: cough/congestion/body aches/cjr   Vital Signs:  Patient profile:   77 year old female Weight:      136 pounds O2 Sat:      93 % Temp:     97.6 degrees F axillary Pulse rate:   86 / minute BP sitting:   128 / 80  (left arm) Cuff size:   regular  Vitals Entered By: Duard Brady LPN (November 30, 2010 3:40 PM) CC: c/o chest congestion and productive cough Is Patient Diabetic? No   CC:  c/o chest congestion and productive cough.  History of Present Illness: 73 -year-old patient who is seen today for a follow up urine.  She is evaluated in the emergency department two days ago and placed on a Z-Pak for a URI.  Her chief complaint is an achiness in her refractory cough.  Cough is large and nonproductive.  She has anorexia and in general, feels unwell.  No wheezing, shortness of breath or chest pain, no chills.  She does have a history of allergic rhinitis.  Allergies: 1)  Soma (Carisoprodol) 2)  Flexeril (Cyclobenzaprine Hcl) 3)  Penicillin G Potassium (Penicillin G Potassium)  Past History:  Past Medical History: Reviewed history from 02/17/2009 and no changes required. Hypothyroidism Osteopenia splenic artery aneurysm Depression pulmonary nodule:  March 2010  Review of Systems       The patient complains of prolonged cough.  The patient denies anorexia, fever, weight loss, weight gain, vision loss, decreased hearing, hoarseness, chest pain, syncope, dyspnea on exertion, peripheral edema, headaches, hemoptysis, abdominal pain, melena, hematochezia, severe indigestion/heartburn, hematuria, incontinence, genital sores, muscle weakness, suspicious skin lesions, transient blindness, difficulty walking, depression, unusual weight change, abnormal bleeding, enlarged lymph nodes, angioedema, and breast masses.    Physical Exam  General:  elderly alert, no distress.  Frequent paroxysms of coughing, afebrile, blood pressure 128/80 Head:  Normocephalic and atraumatic  without obvious abnormalities. No apparent alopecia or balding. Eyes:  No corneal or conjunctival inflammation noted. EOMI. Perrla. Funduscopic exam benign, without hemorrhages, exudates or papilledema. Vision grossly normal. Ears:  External ear exam shows no significant lesions or deformities.  Otoscopic examination reveals clear canals, tympanic membranes are intact bilaterally without bulging, retraction, inflammation or discharge. Hearing is grossly normal bilaterally.hearing aid present on the left Nose:  External nasal examination shows no deformity or inflammation. Nasal mucosa are pink and moist without lesions or exudates. Mouth:  Oral mucosa and oropharynx without lesions or exudates.  Teeth in good repair. Neck:  No deformities, masses, or tenderness noted. Lungs:  Normal respiratory effort, chest expands symmetrically. Lungs are clear to auscultation, no crackles or wheezes. O2 saturation 95 Heart:  Normal rate and regular rhythm. S1 and S2 normal without gallop, murmur, click, rub or other extra sounds. pulse 80   Impression & Recommendations:  Problem # 1:  URI (ICD-465.9)  The following medications were removed from the medication list:    Meloxicam 7.5 Mg Tabs (Meloxicam) ..... One by mouth daily with food Her updated medication list for this problem includes:    Tylenol Arthritis Pain 650 Mg Tbcr (Acetaminophen) .Marland Kitchen... As needed    Hydrocodone-homatropine 5-1.5 Mg/60ml Syrp (Hydrocodone-homatropine) ..... One half to 1 teaspoon every 6 hours as needed for cough  The following medications were removed from the medication list:    Meloxicam 7.5 Mg Tabs (Meloxicam) ..... One by mouth daily with food Her updated medication list for this problem includes:    Tylenol Arthritis Pain 650 Mg Tbcr (Acetaminophen) .Marland KitchenMarland KitchenMarland KitchenMarland Kitchen  As needed    Hydrocodone-homatropine 5-1.5 Mg/17ml Syrp (Hydrocodone-homatropine) ..... One half to 1 teaspoon every 6 hours as needed for cough  Complete Medication  List: 1)  Levothyroxine Sodium 75 Mcg Tabs (Levothyroxine sodium) .Marland Kitchen.. 1 by mouth once daily 2)  Boniva 150 Mg Tabs (Ibandronate sodium) .Marland Kitchen.. 1 by mouth q month 3)  Tylenol Arthritis Pain 650 Mg Tbcr (Acetaminophen) .... As needed 4)  Fluticasone Propionate 50 Mcg/act Susp (Fluticasone propionate) .... 2 sprays each nostril once daily 5)  Azelastine Hcl 137 Mcg/spray Soln (Azelastine hcl) .... 2 sprays each nostril daily 6)  Hydrocodone-homatropine 5-1.5 Mg/24ml Syrp (Hydrocodone-homatropine) .... One half to 1 teaspoon every 6 hours as needed for cough  Patient Instructions: 1)  Please return for a FASTING Lipid Profile in 8 weeks. 2)  Get plenty of rest, drink lots of clear liquids, and use Tylenol or Ibuprofen for fever and comfort. Return in 7-10 days if you're not better:sooner if you're feeling worse. 3)  Take your antibiotic as prescribed until ALL of it is gone, but stop if you develop a rash or swelling and contact our office as soon as possible. 4)  MUCINEX DM USE TWICE DAILY  Prescriptions: HYDROCODONE-HOMATROPINE 5-1.5 MG/5ML SYRP (HYDROCODONE-HOMATROPINE) one half to 1 teaspoon every 6 hours as needed for cough  #6 OZ x 0   Entered and Authorized by:   Gordy Savers  MD   Signed by:   Gordy Savers  MD on 11/30/2010   Method used:   Print then Give to Patient   RxID:   [id number]    Orders Added: 1)  Est. Patient Level III [69629]

## 2010-12-28 NOTE — Progress Notes (Signed)
  Subjective:    Patient ID: Kimberly Maynard, female    DOB: [birth year], 77 y.o.   MRN: [id number]  Cough This is a new problem. The current episode started in the past 7 days. The problem has been gradually improving. The problem occurs every few hours. The cough is non-productive. Pertinent negatives include no chest pain, chills, ear pain, fever, headaches or shortness of breath. The symptoms are aggravated by nothing. Risk factors for lung disease include smoking/tobacco exposure.   Reviewed previous CXR from january    Review of Systems  Constitutional: Negative for fever, chills, activity change and appetite change.  HENT: Negative for ear pain and nosebleeds.   Eyes: Negative for itching.  Respiratory: Positive for cough. Negative for chest tightness and shortness of breath.   Cardiovascular: Negative for chest pain.  Genitourinary: Negative for flank pain.  Musculoskeletal: Positive for back pain (gradually improving---see previous evaluations). Negative for gait problem.  Neurological: Negative for dizziness and headaches.  Hematological: Negative for adenopathy.  Psychiatric/Behavioral: Negative for confusion.       Objective:   Physical Exam        Assessment & Plan:  Patient with URI. See comment field and prescriptions. Side effects discussed.  In addition her back pain is improving. She will call me if symptoms worsen.  . She has osteoporosis. Insurance company is going to deny Boniva. She has a DEXA scan planned for next month. We will decide upon continued bisphosphonate therapy after review of DEXA scan.

## 2011-01-01 ENCOUNTER — Encounter: Payer: Self-pay | Admitting: Internal Medicine

## 2011-01-01 ENCOUNTER — Ambulatory Visit (INDEPENDENT_AMBULATORY_CARE_PROVIDER_SITE_OTHER): Payer: MEDICARE | Admitting: Internal Medicine

## 2011-01-01 VITALS — BP 122/84 | HR 88

## 2011-01-01 DIAGNOSIS — M549 Dorsalgia, unspecified: Secondary | ICD-10-CM

## 2011-01-01 DIAGNOSIS — E860 Dehydration: Secondary | ICD-10-CM

## 2011-01-01 DIAGNOSIS — R1110 Vomiting, unspecified: Secondary | ICD-10-CM

## 2011-01-01 LAB — BASIC METABOLIC PANEL
Calcium: 9.2 mg/dL (ref 8.4–10.5)
Chloride: 105 mEq/L (ref 96–112)
Creatinine, Ser: 0.5 mg/dL (ref 0.4–1.2)

## 2011-01-01 LAB — CBC WITH DIFFERENTIAL/PLATELET
Basophils Relative: 0.1 % (ref 0.0–3.0)
Eosinophils Absolute: 0.1 10*3/uL (ref 0.0–0.7)
HCT: 40.8 % (ref 36.0–46.0)
Hemoglobin: 14 g/dL (ref 12.0–15.0)
Lymphocytes Relative: 12.2 % (ref 12.0–46.0)
Lymphs Abs: 0.9 10*3/uL (ref 0.7–4.0)
MCHC: 34.4 g/dL (ref 30.0–36.0)
MCV: 93.5 fl (ref 78.0–100.0)
Neutro Abs: 6 10*3/uL (ref 1.4–7.7)
RBC: 4.37 Mil/uL (ref 3.87–5.11)

## 2011-01-01 LAB — POCT URINALYSIS DIPSTICK
Spec Grav, UA: 1.03
pH, UA: 5.5

## 2011-01-01 LAB — HEPATIC FUNCTION PANEL
Albumin: 4.3 g/dL (ref 3.5–5.2)
Alkaline Phosphatase: 69 U/L (ref 39–117)
Total Protein: 7.2 g/dL (ref 6.0–8.3)

## 2011-01-01 LAB — AMYLASE: Amylase: 55 U/L (ref 27–131)

## 2011-01-01 MED ORDER — MEPERIDINE HCL 50 MG/ML IJ SOLN
50.0000 mg | Freq: Once | INTRAMUSCULAR | Status: AC
  Administered 2011-01-01: 50 mg via INTRAMUSCULAR

## 2011-01-01 MED ORDER — PROMETHAZINE HCL 25 MG/ML IJ SOLN
25.0000 mg | Freq: Once | INTRAMUSCULAR | Status: AC
  Administered 2011-01-01: 25 mg via INTRAMUSCULAR

## 2011-01-01 MED ORDER — HYDROCODONE-ACETAMINOPHEN 5-325 MG PO TABS: 1.0000 | ORAL_TABLET | ORAL | Status: DC | PRN

## 2011-01-01 MED ORDER — SODIUM CHLORIDE 0.9 % IV SOLN
INTRAVENOUS | Status: DC
  Administered 2011-01-01: 1000 mL via INTRAVENOUS

## 2011-01-01 MED ORDER — PROMETHAZINE HCL 25 MG PO TABS: 25.0000 mg | ORAL_TABLET | Freq: Four times a day (QID) | ORAL | Status: AC | PRN

## 2011-01-01 NOTE — Progress Notes (Signed)
Addended byAlfred Levins on: 01/01/2011 02:10 PM   Modules accepted: Orders

## 2011-01-01 NOTE — Progress Notes (Addendum)
  Subjective:    Patient ID: Kimberly Maynard, female    DOB: 01/28/1928, 77 y.o.   MRN: [id number]  HPI  Pt presents with acute GI sxs. Yesterday complained a lot of "gas". Had a small amount of loose stool as well. Today woke up with severe back pain associated with N/V and frequent diarrhea. Has had multiple episodes of emesis and diarrhea. No unusual foods (except ate a salad a t a restaurant---with chicken).  No fever or chills, no sweats. She describes a severe, crampy abdominal pain.   Review of Systems Patient was seen last week with URI symptoms. Those have gotten significantly better. She also describes marked right-sided back discomfort no other specific complaints and a complete review of systems.    Objective:   Physical Exam      Acutely ill female. HEENT exam atraumatic, normocephalic, neck is supple. Extraocular muscles are intact. No icterus. Chest clear to auscultation cardiac exam S1-S2 are regular. Abdominal exam overweight, active BS, soft. Extremities no edema. Skin turgor is somewhat diminished. Oropharynx is mildly dry.  Assessment & Plan:  Patient with acute illness. She has significant back discomfort, nausea and emesis. She also some diarrhea. Unclear the etiology. I wonder about a kidney stone. I wonder about acute gastroenteritis. I do think she is somewhat dehydrated. Give her some IV fluids in the office. I've given her Phenergan 25 mg IM. She may need some better pain control. I'll check laboratory work to evaluate for kidney stone, abdominal pathology.  Reviewed urinalysis. She does have 2+ blood. I think she's got a kidney stone. She's feeling better after IV fluids given in the office. I'll send her home on hydrocodone and Phenergan. She'll call me tomorrow if her symptoms don't improve. She may need CT scanning. This was discussed with patient and daughter in detail.   IVF- start time 1030 a.m. -- d/c time 1pm

## 2011-01-02 ENCOUNTER — Telehealth: Payer: Self-pay | Admitting: Internal Medicine

## 2011-01-02 NOTE — Telephone Encounter (Signed)
Schedule CT abdomen and pelvis with contrast. ? Kidney stone/hydronephrosis

## 2011-01-02 NOTE — Telephone Encounter (Signed)
Pt daughter called to give update on pts condition. Pts back still hurting,but vomitting and nausea have gone away.

## 2011-01-03 ENCOUNTER — Other Ambulatory Visit: Payer: Self-pay | Admitting: *Deleted

## 2011-01-03 DIAGNOSIS — N200 Calculus of kidney: Secondary | ICD-10-CM

## 2011-01-04 ENCOUNTER — Ambulatory Visit (INDEPENDENT_AMBULATORY_CARE_PROVIDER_SITE_OTHER)
Admission: RE | Admit: 2011-01-04 | Discharge: 2011-01-04 | Disposition: A | Discharge status: Home / Self Care | Payer: MEDICARE | Source: Ambulatory Visit | Attending: Internal Medicine | Admitting: Internal Medicine

## 2011-01-04 ENCOUNTER — Encounter: Payer: Self-pay | Admitting: Internal Medicine

## 2011-01-04 ENCOUNTER — Ambulatory Visit (INDEPENDENT_AMBULATORY_CARE_PROVIDER_SITE_OTHER): Payer: MEDICARE | Admitting: Internal Medicine

## 2011-01-04 VITALS — BP 108/76 | Temp 97.5°F | Wt 132.0 lb

## 2011-01-04 DIAGNOSIS — M549 Dorsalgia, unspecified: Secondary | ICD-10-CM

## 2011-01-06 ENCOUNTER — Encounter: Payer: Self-pay | Admitting: Internal Medicine

## 2011-01-06 NOTE — Progress Notes (Signed)
  Subjective:    Patient ID: Kimberly Maynard, female    DOB: 26-Oct-1928, 77 y.o.   MRN: [id number]  HPI   patient continues to have back pain. See my previous note. History of present illness was reviewed with the patient and daughter. They agree. Nausea and vomiting are better. She continues to have some back discomfort and significant diarrhea. Back pain can be very intense rated as an 8/10. It waxes and wanes. He has not noted any hematuria. Reviewed previous laboratories. Note 2+ blood in previous urine sample.  Review of Systems  Patient complains of back pain and diarrhea. No fever, chills or any other specific complaints and a complete review of systems.    Objective:   Physical Exam  elderly female in no acute distress. HEENT exam atraumatic, normocephalic neck supple. Chest clear to auscultation cardiac exam S1-S2 are regular. Abdomen without masses, soft no rebound or guarding tenderness. Extremities no clubbing cyanosis or edema.       Assessment & Plan:   patient with abdominal pain. As diarrhea and back pain. Unclear etiology. I still think there is a high likelihood that she has nephrolithiasis. It would not explain the diarrhea. I think she needs further evaluation. Obtain CT of the abdomen and pelvis today. Discussed with patient and daughter. If CT is okay it could be that her diarrhea is an infectious problem.

## 2011-01-21 ENCOUNTER — Emergency Department (HOSPITAL_COMMUNITY)
Admission: EM | Admit: 2011-01-21 | Discharge: 2011-01-22 | Disposition: A | Discharge status: Home / Self Care | Payer: Medicare Other | Attending: Emergency Medicine | Admitting: Emergency Medicine

## 2011-01-21 ENCOUNTER — Emergency Department (HOSPITAL_COMMUNITY): Payer: Medicare Other

## 2011-01-21 DIAGNOSIS — J40 Bronchitis, not specified as acute or chronic: Secondary | ICD-10-CM | POA: Insufficient documentation

## 2011-01-21 DIAGNOSIS — Z833 Family history of diabetes mellitus: Secondary | ICD-10-CM | POA: Insufficient documentation

## 2011-01-21 DIAGNOSIS — Z880 Allergy status to penicillin: Secondary | ICD-10-CM | POA: Insufficient documentation

## 2011-01-21 LAB — POCT CARDIAC MARKERS
Myoglobin, poc: 59.9 ng/mL (ref 12–200)
Troponin i, poc: <0.05 ng/mL (ref 0.00–0.09)

## 2011-01-21 LAB — CBC
Hemoglobin: 12 g/dL (ref 12.0–15.0)
RBC: 3.99 MIL/uL (ref 3.87–5.11)

## 2011-01-21 LAB — POCT I-STAT, CHEM 8
BUN: 4 mg/dL — ABNORMAL LOW (ref 6–23)
Hemoglobin: 12.2 g/dL (ref 12.0–15.0)
Sodium: 135 mEq/L (ref 135–145)
TCO2: 26 mmol/L (ref 0–100)

## 2011-01-21 LAB — DIFFERENTIAL
Basophils Absolute: 0 10*3/uL (ref 0.0–0.1)
Basophils Relative: 0 % (ref 0–1)
Neutro Abs: 2 10*3/uL (ref 1.7–7.7)
Neutrophils Relative %: 38 % — ABNORMAL LOW (ref 43–77)

## 2011-02-02 ENCOUNTER — Ambulatory Visit: Payer: Medicare Other | Admitting: Internal Medicine

## 2011-02-20 ENCOUNTER — Encounter: Payer: Self-pay | Admitting: Internal Medicine

## 2011-02-20 ENCOUNTER — Ambulatory Visit (INDEPENDENT_AMBULATORY_CARE_PROVIDER_SITE_OTHER): Payer: Medicare Other | Admitting: Internal Medicine

## 2011-02-20 VITALS — BP 122/80 | Temp 98.2°F | Wt 134.0 lb

## 2011-02-20 DIAGNOSIS — E039 Hypothyroidism, unspecified: Secondary | ICD-10-CM

## 2011-02-20 DIAGNOSIS — M949 Disorder of cartilage, unspecified: Secondary | ICD-10-CM

## 2011-02-20 DIAGNOSIS — M899 Disorder of bone, unspecified: Secondary | ICD-10-CM

## 2011-02-20 DIAGNOSIS — J42 Unspecified chronic bronchitis: Secondary | ICD-10-CM

## 2011-02-20 DIAGNOSIS — J449 Chronic obstructive pulmonary disease, unspecified: Secondary | ICD-10-CM | POA: Insufficient documentation

## 2011-02-20 LAB — TSH: TSH: 0.25 u[IU]/mL — ABNORMAL LOW (ref 0.35–5.50)

## 2011-02-20 NOTE — Assessment & Plan Note (Signed)
Controlled Continue meds Recheck labs today

## 2011-02-20 NOTE — Assessment & Plan Note (Signed)
Needs dexa scan---will order

## 2011-02-20 NOTE — Patient Instructions (Signed)
Labs today We should call you about the bone density scan and pulmonary tests If you haven't heard from [place] by Friday . ..Marland Kitchen Call us

## 2011-02-20 NOTE — Assessment & Plan Note (Signed)
Will further evaluate with PFTS

## 2011-02-20 NOTE — Progress Notes (Signed)
  Subjective:    Patient ID: Kimberly Maynard, female    DOB: [birth year], 77 y.o.   MRN: [id number]  HPI  Bronchitis---reviewed ED note  Says she is dependent on a suppository for a.m. BMs (these are normal)  Hypothyroid---tolerating replacement  Past Medical History  Diagnosis Date  . OTH MALIG NEOPLASM SKIN UPPER LIMB INCL SHOULDER 08/08/2010  . HYPOTHYROIDISM 08/30/2006  . HYPERLIPIDEMIA 01/15/2008  . ANEMIA 01/15/2008  . DEPRESSION 05/05/2007  . ANEURYSM NOS 08/30/2006  . PULMONARY NODULE, SOLITARY 02/17/2009  . OSTEOPENIA 08/30/2006  . FASTING HYPERGLYCEMIA 05/05/2007   Past Surgical History  Procedure Date  . Abdominal hysterectomy   . Oophorectomy     reports that she has never smoked. She does not have any smokeless tobacco history on file. She reports that she does not drink alcohol or use illicit drugs. family history includes Diabetes in her father. Allergies  Allergen Reactions  . Carisoprodol     REACTION: unspecified  . Cyclobenzaprine Hcl     REACTION: unspecified  . Penicillins     REACTION: itching  . Prednisone Itching     Review of Systems  patient denies chest pain, shortness of breath, orthopnea. Denies lower extremity edema, abdominal pain, change in appetite, change in bowel movements. Patient denies rashes, musculoskeletal complaints. No other specific complaints in a complete review of systems.      Objective:   Physical Exam  Well-developed well-nourished female in no acute distress. HEENT exam atraumatic, normocephalic, extraocular muscles are intact. Neck is supple. No jugular venous distention no thyromegaly. Chest clear to auscultation without increased work of breathing. Cardiac exam S1 and S2 are regular. Abdominal exam active bowel sounds, soft, nontender. Extremities no edema. Neurologic exam she is alert without any motor sensory deficits. Gait is normal.        Assessment & Plan:

## 2011-02-28 ENCOUNTER — Other Ambulatory Visit: Payer: Self-pay | Admitting: Internal Medicine

## 2011-02-28 DIAGNOSIS — M949 Disorder of cartilage, unspecified: Secondary | ICD-10-CM

## 2011-03-01 ENCOUNTER — Telehealth: Payer: Self-pay | Admitting: *Deleted

## 2011-03-01 ENCOUNTER — Ambulatory Visit
Admission: RE | Admit: 2011-03-01 | Discharge: 2011-03-01 | Disposition: A | Discharge status: Home / Self Care | Payer: Medicare Other | Source: Ambulatory Visit

## 2011-03-01 DIAGNOSIS — M949 Disorder of cartilage, unspecified: Secondary | ICD-10-CM

## 2011-03-01 NOTE — Telephone Encounter (Signed)
Stay off the medicine until next OV, we will discuss then

## 2011-03-01 NOTE — Telephone Encounter (Signed)
Pt called and said she received a letter that Sandrea Hammond is being taken off the market.  What do you want her to take now?  [facility]

## 2011-03-01 NOTE — Telephone Encounter (Signed)
Pt aware.

## 2011-05-15 ENCOUNTER — Ambulatory Visit (INDEPENDENT_AMBULATORY_CARE_PROVIDER_SITE_OTHER)
Admission: RE | Admit: 2011-05-15 | Discharge: 2011-05-15 | Disposition: A | Discharge status: Home / Self Care | Payer: Medicare Other | Source: Ambulatory Visit

## 2011-05-15 ENCOUNTER — Other Ambulatory Visit: Payer: Self-pay | Admitting: Internal Medicine

## 2011-05-15 DIAGNOSIS — M949 Disorder of cartilage, unspecified: Secondary | ICD-10-CM

## 2011-05-15 DIAGNOSIS — M899 Disorder of bone, unspecified: Secondary | ICD-10-CM

## 2011-05-22 ENCOUNTER — Encounter: Payer: Self-pay | Admitting: Internal Medicine

## 2011-05-25 ENCOUNTER — Encounter: Payer: Self-pay | Admitting: Internal Medicine

## 2011-05-25 ENCOUNTER — Ambulatory Visit (INDEPENDENT_AMBULATORY_CARE_PROVIDER_SITE_OTHER): Payer: Medicare Other | Admitting: Internal Medicine

## 2011-05-25 VITALS — BP 142/74 | Temp 97.9°F | Wt 137.0 lb

## 2011-05-25 DIAGNOSIS — M720 Palmar fascial fibromatosis [Dupuytren]: Secondary | ICD-10-CM

## 2011-05-25 NOTE — Progress Notes (Signed)
  Subjective:    Patient ID: Kimberly Maynard, female    DOB: 27-May-1928, 77 y.o.   MRN: [id number]  HPI  Patient comes in for evaluation of left hand pain. She has tingling of her hand at night. She is using a wrist brace without success. She also notes lumps on the palm of her hand that are painful to palpation. She denies any trauma.  Past Medical History  Diagnosis Date  . OTH MALIG NEOPLASM SKIN UPPER LIMB INCL SHOULDER 08/08/2010  . HYPOTHYROIDISM 08/30/2006  . HYPERLIPIDEMIA 01/15/2008  . ANEMIA 01/15/2008  . DEPRESSION 05/05/2007  . ANEURYSM NOS 08/30/2006  . PULMONARY NODULE, SOLITARY 02/17/2009  . OSTEOPENIA 08/30/2006  . FASTING HYPERGLYCEMIA 05/05/2007   Past Surgical History  Procedure Date  . Abdominal hysterectomy   . Oophorectomy     reports that she has never smoked. She does not have any smokeless tobacco history on file. She reports that she does not drink alcohol or use illicit drugs. family history includes Diabetes in her father. Allergies  Allergen Reactions  . Carisoprodol     REACTION: unspecified  . Cyclobenzaprine Hcl     REACTION: unspecified  . Penicillins     REACTION: itching  . Prednisone Itching     Review of Systems    patient denies chest pain, shortness of breath, orthopnea. Denies lower extremity edema, abdominal pain, change in appetite, change in bowel movements. Patient denies rashes, musculoskeletal complaints. No other specific complaints in a complete review of systems.    Objective:   Physical Exam Well-developed female in no acute distress. She is wearing a wrist brace on her left hand. She has Dupuytren's contractures on the left affecting her fourth finger. Positive Tinel sign.       Assessment & Plan:  The patient has 2 problems affecting her left hand. She is to produce contractures and carpal tunnel syndrome. I'll refer to hand surgery. I suspect recent carpal tunnel release in May need evaluation of the proximal contractures  well-appearing

## 2011-06-06 ENCOUNTER — Other Ambulatory Visit: Payer: Self-pay | Admitting: Internal Medicine

## 2011-06-06 DIAGNOSIS — J984 Other disorders of lung: Secondary | ICD-10-CM

## 2011-06-13 ENCOUNTER — Other Ambulatory Visit (INDEPENDENT_AMBULATORY_CARE_PROVIDER_SITE_OTHER): Payer: Medicare Other

## 2011-06-13 DIAGNOSIS — E785 Hyperlipidemia, unspecified: Secondary | ICD-10-CM

## 2011-06-13 LAB — BASIC METABOLIC PANEL
CO2: 29 mEq/L (ref 19–32)
Calcium: 8.8 mg/dL (ref 8.4–10.5)
Creatinine, Ser: 0.5 mg/dL (ref 0.4–1.2)

## 2011-06-21 ENCOUNTER — Ambulatory Visit (INDEPENDENT_AMBULATORY_CARE_PROVIDER_SITE_OTHER)
Admission: RE | Admit: 2011-06-21 | Discharge: 2011-06-21 | Disposition: A | Discharge status: Home / Self Care | Payer: Medicare Other | Source: Ambulatory Visit | Attending: Internal Medicine | Admitting: Internal Medicine

## 2011-06-21 DIAGNOSIS — J984 Other disorders of lung: Secondary | ICD-10-CM

## 2011-06-21 MED ORDER — IOHEXOL 300 MG/ML  SOLN
80.0000 mL | Freq: Once | INTRAMUSCULAR | Status: AC | PRN
  Administered 2011-06-21: 80 mL via INTRAVENOUS

## 2011-07-03 ENCOUNTER — Ambulatory Visit (HOSPITAL_BASED_OUTPATIENT_CLINIC_OR_DEPARTMENT_OTHER)
Admission: RE | Admit: 2011-07-03 | Discharge: 2011-07-03 | Disposition: A | Discharge status: Home / Self Care | Payer: Medicare Other | Source: Ambulatory Visit | Attending: Orthopedic Surgery | Admitting: Orthopedic Surgery

## 2011-07-03 DIAGNOSIS — M65839 Other synovitis and tenosynovitis, unspecified forearm: Secondary | ICD-10-CM | POA: Insufficient documentation

## 2011-07-03 DIAGNOSIS — G5600 Carpal tunnel syndrome, unspecified upper limb: Secondary | ICD-10-CM | POA: Insufficient documentation

## 2011-07-03 DIAGNOSIS — Z01812 Encounter for preprocedural laboratory examination: Secondary | ICD-10-CM | POA: Insufficient documentation

## 2011-07-03 DIAGNOSIS — M65849 Other synovitis and tenosynovitis, unspecified hand: Secondary | ICD-10-CM | POA: Insufficient documentation

## 2011-07-03 LAB — POCT HEMOGLOBIN-HEMACUE: Hemoglobin: 13.3 g/dL (ref 12.0–15.0)

## 2011-08-03 ENCOUNTER — Other Ambulatory Visit: Payer: Self-pay | Admitting: Internal Medicine

## 2011-08-28 NOTE — Op Note (Addendum)
Kimberly Maynard, Kimberly Maynard NO.:  [id number]  MEDICAL RECORD NO.:  [id number]  LOCATION:  [facility]                         FACILITY:  [facility]  PHYSICIAN:  Cindee Salt, M.D.       DATE OF BIRTH:  1928/11/15  DATE OF PROCEDURE:  07/03/2011 DATE OF DISCHARGE:  01/22/2011                              OPERATIVE REPORT   PREOPERATIVE DIAGNOSES:  Carpal tunnel syndrome, stenosing tenosynovitis, left thumb, left middle finger.  POSTOPERATIVE DIAGNOSES:  Carpal tunnel syndrome, stenosing tenosynovitis, left thumb, left middle finger.  OPERATION:  Release of A1 pulley, right middle finger, release left carpal tunnel.  SURGEON:  Cindee Salt, MD  ASSISTANT:  Betha Loa, MD  ANESTHESIA:  General with local infiltration.  ANESTHESIOLOGIST:  Burna Forts, MD  HISTORY:  The patient is an 77 year old female with a history of carpal tunnel syndrome.  EMG nerve conduction is positive with triggering of her left middle finger.  She has elected to undergo surgical decompression to each.  Pre, peri, and postoperative course have been discussed along with risks and complications.  She is aware there is no guarantee with the surgery, possibility of infection, recurrence of injury to arteries, nerves, tendons, incomplete relief of symptoms, and dystrophy.  In the preoperative area, the patient is seen, the extremity marked by both the patient and surgeon, and antibiotic given.  PROCEDURE:  The patient was brought to the operating room where a general anesthetic was carried out without difficulty.  She was prepped using ChloraPrep, supine position with a left arm free.  A 3-minute dry time was allowed.  Time-out taken confirming the patient and procedure. A longitudinal incision was made in the palm carried down through the subcutaneous tissue.  Bleeders were electrocauterized.  Palmar fascia was split, superficial palmar arch identified, the flexor tendon to the ring little finger  identified to the ulnar side of median nerve and carpal retinaculum was incised with sharp dissection.  Right angle and Sewall retractor were placed between skin and forearm fascia.  Fascia was released for approximately a centimeter and half proximal to the wrist crease under direct vision.  Canal was explored.  Air compression to the nerve was apparent.  No further lesions were identified.  The wound was irrigated.  The skin was closed with interrupted 4-0 Vicryl Rapide sutures.  Separate incision was then made over the A1 pulley of the left middle finger carried down through the subcutaneous tissue, again bleeders were electrocauterized.  The neurovascular structures retracted.  The A1 pulley was then released in its radial aspect.  Small incision made centrally in A2.  Partial tenosynovectomy performed proximally to the adherent tenosynovium between superficial and profundus tendons.  Finger placed through a full range motion, no further triggering was identified.  The wound was irrigated and closed with interrupted 4-0 Vicryl Rapide.  Local infiltration was given with 0.25% Marcaine without epinephrine, approximately 8 mL was used. Sterile compressive dressing was applied with fingers free.  On deflation of the tourniquet, all fingers were immediately pinked.  She was taken to the recovery room for observation in satisfactory condition.  She will be discharged to home to return to the [facility]  in 1 week, on Vicodin.          ______________________________ Cindee Salt, M.D.     GK/MEDQ  D:  07/03/2011  T:  07/03/2011  Job:  [id number]  cc:   Valetta Mole. Swords, MD  Electronically Signed by Cindee Salt M.D. on 08/28/2011 04:35:00 PM

## 2011-10-04 ENCOUNTER — Encounter: Payer: Self-pay | Admitting: Family Medicine

## 2011-10-04 ENCOUNTER — Ambulatory Visit (INDEPENDENT_AMBULATORY_CARE_PROVIDER_SITE_OTHER): Payer: Medicare Other | Admitting: Family Medicine

## 2011-10-04 VITALS — BP 130/68 | Temp 98.0°F | Wt 135.0 lb

## 2011-10-04 DIAGNOSIS — J45909 Unspecified asthma, uncomplicated: Secondary | ICD-10-CM

## 2011-10-04 MED ORDER — METHYLPREDNISOLONE ACETATE 80 MG/ML IJ SUSP: 80.0000 mg | Freq: Once | INTRAMUSCULAR | Status: DC

## 2011-10-04 NOTE — Progress Notes (Signed)
  Subjective:    Patient ID: Kimberly Maynard, female    DOB: 30-Aug-1928, 77 y.o.   MRN: [id number]  HPI Acute visit. Three-day history of cough which is mostly nonproductive. Took Mucinex DM and had some nausea. She has not had any significant nasal congestion. Denies any fever. Nonsmoker but significant secondhand cigarette smoke. She denies any sore throat, nausea, vomiting, or diarrhea. Possibly some mild wheezing off and on. No history of reactive airway disease. She has used Ventolin inhaler in the past though.   Review of Systems  Constitutional: Negative for fever, chills, appetite change and unexpected weight change.  HENT: Negative for trouble swallowing, voice change and postnasal drip.   Respiratory: Positive for cough and wheezing. Negative for shortness of breath.   Cardiovascular: Negative for chest pain, palpitations and leg swelling.  Neurological: Negative for dizziness.       Objective:   Physical Exam  Constitutional: She appears well-developed and well-nourished.  HENT:  Right Ear: External ear normal.  Left Ear: External ear normal.  Mouth/Throat: Oropharynx is clear and moist.  Neck: Neck supple.  Cardiovascular: Normal rate and regular rhythm.   Pulmonary/Chest:       No retractions. She has some faint scattered wheezes. No rales.  Lymphadenopathy:    She has no cervical adenopathy.          Assessment & Plan:  Cough probably secondary to acute viral bronchitis. No indicators of pneumonia at this time. Continue Ventolin as needed. Depo-Medrol 80 mg IM given for wheezing.

## 2011-10-04 NOTE — Patient Instructions (Signed)
Get back on respiratory inhaler as needed for cough and wheezing. Let us know if you develop any fever or worsening symptoms.

## 2011-11-02 ENCOUNTER — Ambulatory Visit: Payer: Medicare Other | Admitting: Internal Medicine

## 2011-12-07 ENCOUNTER — Encounter: Payer: Self-pay | Admitting: Internal Medicine

## 2011-12-07 ENCOUNTER — Ambulatory Visit (INDEPENDENT_AMBULATORY_CARE_PROVIDER_SITE_OTHER): Payer: Medicare Other | Admitting: Internal Medicine

## 2011-12-07 DIAGNOSIS — Z23 Encounter for immunization: Secondary | ICD-10-CM

## 2011-12-07 DIAGNOSIS — R252 Cramp and spasm: Secondary | ICD-10-CM

## 2011-12-07 DIAGNOSIS — D649 Anemia, unspecified: Secondary | ICD-10-CM

## 2011-12-07 DIAGNOSIS — E785 Hyperlipidemia, unspecified: Secondary | ICD-10-CM

## 2011-12-07 DIAGNOSIS — R7989 Other specified abnormal findings of blood chemistry: Secondary | ICD-10-CM

## 2011-12-07 DIAGNOSIS — E039 Hypothyroidism, unspecified: Secondary | ICD-10-CM

## 2011-12-07 LAB — HEPATIC FUNCTION PANEL
Alkaline Phosphatase: 73 U/L (ref 39–117)
Bilirubin, Direct: 0.1 mg/dL (ref 0.0–0.3)
Total Protein: 6.9 g/dL (ref 6.0–8.3)

## 2011-12-07 LAB — BASIC METABOLIC PANEL
CO2: 29 mEq/L (ref 19–32)
Calcium: 9.3 mg/dL (ref 8.4–10.5)
Creatinine, Ser: 0.4 mg/dL (ref 0.4–1.2)
GFR: 149.51 mL/min (ref >60.00)
Sodium: 141 mEq/L (ref 135–145)

## 2011-12-07 LAB — LIPID PANEL
Cholesterol: 216 mg/dL — ABNORMAL HIGH (ref 0–200)
HDL: 48.1 mg/dL (ref >39.00)
Total CHOL/HDL Ratio: 4
Triglycerides: 109 mg/dL (ref 0.0–149.0)

## 2011-12-07 LAB — HEMOGLOBIN A1C: Hgb A1c MFr Bld: 5.4 % (ref 4.6–6.5)

## 2011-12-07 MED ORDER — MAGNESIUM OXIDE 400 MG PO TABS: 400.0000 mg | ORAL_TABLET | Freq: Two times a day (BID) | ORAL | Status: AC

## 2011-12-07 NOTE — Patient Instructions (Signed)
Call your insurance company and see if they will cover shingles vaccine. If they will, call us and we will give it to you  

## 2011-12-07 NOTE — Assessment & Plan Note (Signed)
Check labs today.

## 2011-12-07 NOTE — Progress Notes (Signed)
  Subjective:    Patient ID: Kimberly Maynard, female    DOB: 1928/03/13, 78 y.o.   MRN: [id number]  HPI Complains of leg cramping  bronshospasm--resolved  Hypothyroid---taking meds  Hyperglycemia--needs refills  Health maint---reviewed immunizations  Past Medical History  Diagnosis Date  . OTH MALIG NEOPLASM SKIN UPPER LIMB INCL SHOULDER 08/08/2010  . HYPOTHYROIDISM 08/30/2006  . HYPERLIPIDEMIA 01/15/2008  . ANEMIA 01/15/2008  . DEPRESSION 05/05/2007  . ANEURYSM NOS 08/30/2006  . PULMONARY NODULE, SOLITARY 02/17/2009  . OSTEOPENIA 08/30/2006  . FASTING HYPERGLYCEMIA 05/05/2007   Past Surgical History  Procedure Date  . Abdominal hysterectomy   . Oophorectomy     reports that she has never smoked. She does not have any smokeless tobacco history on file. She reports that she does not drink alcohol or use illicit drugs. family history includes Diabetes in her father. Allergies  Allergen Reactions  . Carisoprodol     REACTION: unspecified  . Cyclobenzaprine Hcl     REACTION: unspecified  . Penicillins     REACTION: itching  . Prednisone Itching   Review of Systems  patient denies chest pain, shortness of breath, orthopnea. Denies lower extremity edema, abdominal pain, change in appetite, change in bowel movements. Patient denies rashes, musculoskeletal complaints. No other specific complaints in a complete review of systems.      Objective:   Physical Exam  Well-developed well-nourished female in no acute distress. HEENT exam atraumatic, normocephalic, extraocular muscles are intact. Neck is supple. No jugular venous distention no thyromegaly. Chest clear to auscultation without increased work of breathing. Cardiac exam S1 and S2 are regular. Abdominal exam active bowel sounds, soft, nontender. Extremities no edema.       Assessment & Plan:  Leg cramping---trial magnesium

## 2011-12-07 NOTE — Assessment & Plan Note (Signed)
Lab Results  Component Value Date   WBC 5.4 01/21/2011   HGB 13.3 07/03/2011   HCT 36.0 01/21/2011   MCV 91.5 01/21/2011   PLT 181 01/21/2011  anemia resolved

## 2011-12-31 ENCOUNTER — Encounter: Payer: Self-pay | Admitting: Internal Medicine

## 2011-12-31 ENCOUNTER — Ambulatory Visit (INDEPENDENT_AMBULATORY_CARE_PROVIDER_SITE_OTHER): Payer: Medicare Other | Admitting: Internal Medicine

## 2011-12-31 DIAGNOSIS — E039 Hypothyroidism, unspecified: Secondary | ICD-10-CM

## 2011-12-31 DIAGNOSIS — J42 Unspecified chronic bronchitis: Secondary | ICD-10-CM

## 2011-12-31 DIAGNOSIS — J069 Acute upper respiratory infection, unspecified: Secondary | ICD-10-CM

## 2011-12-31 DIAGNOSIS — J449 Chronic obstructive pulmonary disease, unspecified: Secondary | ICD-10-CM

## 2011-12-31 MED ORDER — HYDROCODONE-HOMATROPINE 5-1.5 MG/5ML PO SYRP: 2.5000 mL | ORAL_SOLUTION | Freq: Four times a day (QID) | ORAL | Status: AC | PRN

## 2011-12-31 NOTE — Progress Notes (Signed)
  Subjective:    Patient ID: Kimberly Maynard, female    DOB: 1928-05-31, 78 y.o.   MRN: [id number]  HPI  78 year old patient who has a history recurrent bronchitis. Past 4 days she has had significant cough. Refractory cough is her chief complaint. She has had no sputum production but has noticed some intermittent wheezing. She has treated hypothyroidism. No chest pain    Review of Systems  Constitutional: Positive for activity change, appetite change and fatigue.  HENT: Negative for hearing loss, congestion, sore throat, rhinorrhea, dental problem, sinus pressure and tinnitus.   Eyes: Negative for pain, discharge and visual disturbance.  Respiratory: Positive for cough, chest tightness and wheezing. Negative for shortness of breath.   Cardiovascular: Negative for chest pain, palpitations and leg swelling.  Gastrointestinal: Negative for nausea, vomiting, abdominal pain, diarrhea, constipation, blood in stool and abdominal distention.  Genitourinary: Negative for dysuria, urgency, frequency, hematuria, flank pain, vaginal bleeding, vaginal discharge, difficulty urinating, vaginal pain and pelvic pain.  Musculoskeletal: Negative for joint swelling, arthralgias and gait problem.  Skin: Negative for rash.  Neurological: Negative for dizziness, syncope, speech difficulty, weakness, numbness and headaches.  Hematological: Negative for adenopathy.  Psychiatric/Behavioral: Negative for behavioral problems, dysphoric mood and agitation. The patient is not nervous/anxious.        Objective:   Physical Exam  Constitutional: She is oriented to person, place, and time. She appears well-developed and well-nourished.  HENT:  Head: Normocephalic.  Right Ear: External ear normal.  Left Ear: External ear normal.  Mouth/Throat: Oropharynx is clear and moist.  Eyes: Conjunctivae and EOM are normal. Pupils are equal, round, and reactive to light.  Neck: Normal range of motion. Neck supple. No thyromegaly  present.  Cardiovascular: Normal rate, regular rhythm, normal heart sounds and intact distal pulses.   Pulmonary/Chest: Effort normal and breath sounds normal. She has no wheezes.       Frequent paroxysms of coughing. No wheezing. Chest is clear to auscultation  Abdominal: Soft. Bowel sounds are normal. She exhibits no mass. There is no tenderness.  Musculoskeletal: Normal range of motion.  Lymphadenopathy:    She has no cervical adenopathy.  Neurological: She is alert and oriented to person, place, and time.  Skin: Skin is warm and dry. No rash noted.  Psychiatric: She has a normal mood and affect. Her behavior is normal.          Assessment & Plan:   Viral URI. Will treat symptomatically with increased fluids and expectorants. Will try Mucinex DM. If this is not effective will try low-dose hydrocodone as a cough suppressant. She will call if there is any clinical worsening or worsening wheezing Hypothyroidism stable

## 2011-12-31 NOTE — Patient Instructions (Signed)
MUCINEX DM 1 twice daily  Get plenty of rest, Drink lots of  clear liquids, and use Tylenol or ibuprofen for fever and discomfort.    Call or return to clinic prn if these symptoms worsen or fail to improve as anticipated.

## 2012-01-28 ENCOUNTER — Other Ambulatory Visit: Payer: Self-pay | Admitting: Internal Medicine

## 2012-06-02 ENCOUNTER — Ambulatory Visit: Payer: Medicare Other | Admitting: Internal Medicine

## 2012-06-19 ENCOUNTER — Ambulatory Visit (INDEPENDENT_AMBULATORY_CARE_PROVIDER_SITE_OTHER): Payer: Medicare Other | Admitting: Internal Medicine

## 2012-06-19 ENCOUNTER — Encounter: Payer: Self-pay | Admitting: Internal Medicine

## 2012-06-19 VITALS — BP 135/70 | HR 68 | Temp 97.9°F | Wt 134.0 lb

## 2012-06-19 DIAGNOSIS — Z23 Encounter for immunization: Secondary | ICD-10-CM

## 2012-06-19 DIAGNOSIS — E039 Hypothyroidism, unspecified: Secondary | ICD-10-CM

## 2012-06-19 DIAGNOSIS — R7989 Other specified abnormal findings of blood chemistry: Secondary | ICD-10-CM

## 2012-06-19 DIAGNOSIS — Z2911 Encounter for prophylactic immunotherapy for respiratory syncytial virus (RSV): Secondary | ICD-10-CM

## 2012-06-19 DIAGNOSIS — E785 Hyperlipidemia, unspecified: Secondary | ICD-10-CM

## 2012-06-19 LAB — BASIC METABOLIC PANEL
BUN: 13 mg/dL (ref 6–23)
CO2: 29 mEq/L (ref 19–32)
Calcium: 9.6 mg/dL (ref 8.4–10.5)
Creatinine, Ser: 0.6 mg/dL (ref 0.4–1.2)
GFR: 112.39 mL/min (ref >60.00)
Glucose, Bld: 86 mg/dL (ref 70–99)
Sodium: 141 mEq/L (ref 135–145)

## 2012-06-19 LAB — TSH: TSH: 0.29 u[IU]/mL — ABNORMAL LOW (ref 0.35–5.50)

## 2012-06-19 LAB — LIPID PANEL: Total CHOL/HDL Ratio: 4

## 2012-06-19 NOTE — Progress Notes (Signed)
Patient ID: Kimberly Maynard, female   DOB: 11-16-1928, 77 y.o.   MRN: [id number] HYPOTHYROID- needs f/u  Hyperglycemia-- needs f/u  Lipids-- not treated, will check labs  She feels well, no complaints  Past Medical History  Diagnosis Date  . OTH MALIG NEOPLASM SKIN UPPER LIMB INCL SHOULDER 08/08/2010  . HYPOTHYROIDISM 08/30/2006  . HYPERLIPIDEMIA 01/15/2008  . ANEMIA 01/15/2008  . DEPRESSION 05/05/2007  . ANEURYSM NOS 08/30/2006  . PULMONARY NODULE, SOLITARY 02/17/2009  . OSTEOPENIA 08/30/2006  . FASTING HYPERGLYCEMIA 05/05/2007    History   Social History  . Marital Status: Widowed    Spouse Name: N/A    Number of Children: N/A  . Years of Education: N/A   Occupational History  . Not on file.   Social History Main Topics  . Smoking status: Never Smoker   . Smokeless tobacco: Not on file  . Alcohol Use: No  . Drug Use: No  . Sexually Active:    Other Topics Concern  . Not on file   Social History Narrative  . No narrative on file    Past Surgical History  Procedure Date  . Abdominal hysterectomy   . Oophorectomy     Family History  Problem Relation Age of Onset  . Diabetes Father     Allergies  Allergen Reactions  . Carisoprodol     REACTION: unspecified  . Cyclobenzaprine Hcl     REACTION: unspecified  . Penicillins     REACTION: itching  . Prednisone Itching    Current Outpatient Prescriptions on File Prior to Visit  Medication Sig Dispense Refill  . acetaminophen (TYLENOL) 650 MG CR tablet Take 650 mg by mouth as needed.        Marland Kitchen levothyroxine (SYNTHROID, LEVOTHROID) 75 MCG tablet TAKE ONE TABLET BY MOUTH EVERY DAY  90 tablet  1  . magnesium oxide (MAG-OX 400) 400 MG tablet Take 1 tablet (400 mg total) by mouth 2 (two) times daily.         patient denies chest pain, shortness of breath, orthopnea. Denies lower extremity edema, abdominal pain, change in appetite, change in bowel movements. Patient denies rashes, musculoskeletal complaints. No other  specific complaints in a complete review of systems.   BP 135/70  Pulse 68  Temp 97.9 F (36.6 C) (Oral)  Wt 134 lb (60.782 kg)  Well-developed well-nourished female in no acute distress. HEENT exam atraumatic, normocephalic, extraocular muscles are intact. Neck is supple. No jugular venous distention no thyromegaly. Chest clear to auscultation without increased work of breathing. Cardiac exam S1 and S2 are regular. Abdominal exam active bowel sounds, soft, nontender. Extremities no edema.

## 2012-06-20 ENCOUNTER — Other Ambulatory Visit: Payer: Self-pay | Admitting: Internal Medicine

## 2012-06-20 DIAGNOSIS — E039 Hypothyroidism, unspecified: Secondary | ICD-10-CM

## 2012-06-20 NOTE — Assessment & Plan Note (Signed)
Needs f/u Check labs  

## 2012-06-20 NOTE — Progress Notes (Signed)
Quick Note:  Spoke with pt- informed of results and dr. Marliss Coots instructions to repeat tsh in 4 mos - non fasting test - transferred to scheduling for lab appt. ______

## 2012-06-20 NOTE — Assessment & Plan Note (Signed)
Check f/u labs

## 2012-06-20 NOTE — Assessment & Plan Note (Signed)
Continue replacement Check labs

## 2012-06-25 ENCOUNTER — Ambulatory Visit: Payer: Medicare Other | Admitting: Internal Medicine

## 2012-08-04 ENCOUNTER — Encounter: Payer: Self-pay | Admitting: Family

## 2012-08-04 ENCOUNTER — Ambulatory Visit (INDEPENDENT_AMBULATORY_CARE_PROVIDER_SITE_OTHER): Payer: Medicare Other | Admitting: Family

## 2012-08-04 VITALS — BP 126/70 | HR 92 | Temp 98.5°F | Wt 134.0 lb

## 2012-08-04 DIAGNOSIS — H6090 Unspecified otitis externa, unspecified ear: Secondary | ICD-10-CM

## 2012-08-04 DIAGNOSIS — H9209 Otalgia, unspecified ear: Secondary | ICD-10-CM

## 2012-08-04 DIAGNOSIS — H60399 Other infective otitis externa, unspecified ear: Secondary | ICD-10-CM

## 2012-08-04 MED ORDER — LEVOTHYROXINE SODIUM 75 MCG PO TABS: 75.0000 ug | ORAL_TABLET | Freq: Every day | ORAL | Status: DC

## 2012-08-04 MED ORDER — NEOMYCIN-COLIST-HC-THONZONIUM 3.3-3-10-0.5 MG/ML OT SUSP: 3.0000 [drp] | Freq: Four times a day (QID) | OTIC | Status: AC

## 2012-08-04 NOTE — Patient Instructions (Addendum)
Otitis Externa  Otitis externa ("swimmer's ear") is a germ (bacterial) or fungal infection of the outer ear canal (from the eardrum to the outside of the ear). Swimming in dirty water may cause swimmer's ear. It also may be caused by moisture in the ear from water remaining after swimming or bathing. Often the first signs of infection may be itching in the ear canal. This may progress to ear canal swelling, redness, and pus drainage, which may be signs of infection.  HOME CARE INSTRUCTIONS    Apply the antibiotic drops to the ear canal as prescribed by your doctor.   This can be a very painful medical condition. A strong pain reliever may be prescribed.   Only take over-the-counter or prescription medicines for pain, discomfort, or fever as directed by your caregiver.   If your caregiver has given you a follow-up appointment, it is very important to keep that appointment. Not keeping the appointment could result in a chronic or permanent injury, pain, hearing loss and disability. If there is any problem keeping the appointment, you must call back to this facility for assistance.  PREVENTION    It is important to keep your ear dry. Use the corner of a towel to wick water out of the ear canal after swimming or bathing.   Avoid scratching in your ear. This can damage the ear canal or remove the protective wax lining the canal and make it easier for germs (bacteria) or a fungus to grow.   You may use ear drops made of rubbing alcohol and vinegar after swimming to prevent future "swimmer's ear" infections. Make up a small bottle of equal parts white vinegar and alcohol. Put 3 or 4 drops into each ear after swimming.   Avoid swimming in lakes, polluted water, or poorly chlorinated pools.  SEEK MEDICAL CARE IF:    An oral temperature above 102 F (38.9 C) develops.   Your ear is still painful after 3 days and shows signs of getting worse (redness, swelling, pain, or pus).  MAKE SURE YOU:    Understand these  instructions.   Will watch your condition.   Will get help right away if you are not doing well or get worse.  Document Released: 11/12/2005 Document Revised: 11/01/2011 Document Reviewed: 06/18/2008  [employer] Patient Information 2012 [employer]

## 2012-08-04 NOTE — Progress Notes (Signed)
Subjective:    Patient ID: Kimberly Maynard, female    DOB: November 02, 1928, 78 y.o.   MRN: [id number]  Otalgia  There is pain in the left (78 year old, nonsmoker) ear. This is a new problem. The current episode started 1 to 4 weeks ago. The problem occurs every few hours. The problem has been gradually worsening. There has been no fever. The pain is at a severity of 7/10. The pain is mild. Pertinent negatives include no abdominal pain, coughing, diarrhea, drainage, ear discharge or rhinorrhea. She has tried ear drops for the symptoms. Her past medical history is significant for hearing loss.      Review of Systems  Constitutional: Negative.  Negative for fever.  HENT: Positive for ear pain and postnasal drip. Negative for rhinorrhea and ear discharge.   Eyes: Positive for discharge.  Respiratory: Negative.  Negative for cough.   Cardiovascular: Negative.   Gastrointestinal: Negative.  Negative for abdominal pain and diarrhea.  Musculoskeletal: Negative.   Skin: Negative.   Neurological: Negative.   Psychiatric/Behavioral: Negative.    Past Medical History  Diagnosis Date  . OTH MALIG NEOPLASM SKIN UPPER LIMB INCL SHOULDER 08/08/2010  . HYPOTHYROIDISM 08/30/2006  . HYPERLIPIDEMIA 01/15/2008  . ANEMIA 01/15/2008  . DEPRESSION 05/05/2007  . ANEURYSM NOS 08/30/2006  . PULMONARY NODULE, SOLITARY 02/17/2009  . OSTEOPENIA 08/30/2006  . FASTING HYPERGLYCEMIA 05/05/2007    History   Social History  . Marital Status: Widowed    Spouse Name: N/A    Number of Children: N/A  . Years of Education: N/A   Occupational History  . Not on file.   Social History Main Topics  . Smoking status: Never Smoker   . Smokeless tobacco: Not on file  . Alcohol Use: No  . Drug Use: No  . Sexually Active:    Other Topics Concern  . Not on file   Social History Narrative  . No narrative on file    Past Surgical History  Procedure Date  . Abdominal hysterectomy   . Oophorectomy     Family History    Problem Relation Age of Onset  . Diabetes Father     Allergies  Allergen Reactions  . Carisoprodol     REACTION: unspecified  . Cyclobenzaprine Hcl     REACTION: unspecified  . Penicillins     REACTION: itching  . Prednisone Itching    Current Outpatient Prescriptions on File Prior to Visit  Medication Sig Dispense Refill  . acetaminophen (TYLENOL) 650 MG CR tablet Take 650 mg by mouth as needed.        . magnesium oxide (MAG-OX 400) 400 MG tablet Take 1 tablet (400 mg total) by mouth 2 (two) times daily.      Marland Kitchen DISCONTD: levothyroxine (SYNTHROID, LEVOTHROID) 75 MCG tablet TAKE ONE TABLET BY MOUTH EVERY DAY  90 tablet  1    BP 126/70  Pulse 92  Temp 98.5 F (36.9 C) (Oral)  Wt 134 lb (60.782 kg)  SpO2 98%chart    Objective:   Physical Exam  Constitutional: She is oriented to person, place, and time. She appears well-developed and well-nourished.  HENT:  Head: Normocephalic.  Right Ear: External ear normal.  Nose: Nose normal.  Mouth/Throat: Oropharynx is clear and moist.       Post auricular node swelling and tenderness.   Eyes: Pupils are equal, round, and reactive to light.  Neck: Normal range of motion. Neck supple.  Cardiovascular: Normal rate, regular rhythm and normal heart  sounds.   Pulmonary/Chest: Effort normal and breath sounds normal.  Neurological: She is alert and oriented to person, place, and time.  Skin: Skin is warm and dry.  Psychiatric: She has a normal mood and affect.   Past Medical History  Diagnosis Date  . OTH MALIG NEOPLASM SKIN UPPER LIMB INCL SHOULDER 08/08/2010  . HYPOTHYROIDISM 08/30/2006  . HYPERLIPIDEMIA 01/15/2008  . ANEMIA 01/15/2008  . DEPRESSION 05/05/2007  . ANEURYSM NOS 08/30/2006  . PULMONARY NODULE, SOLITARY 02/17/2009  . OSTEOPENIA 08/30/2006  . FASTING HYPERGLYCEMIA 05/05/2007    History   Social History  . Marital Status: Widowed    Spouse Name: N/A    Number of Children: N/A  . Years of Education: N/A   Occupational  History  . Not on file.   Social History Main Topics  . Smoking status: Never Smoker   . Smokeless tobacco: Not on file  . Alcohol Use: No  . Drug Use: No  . Sexually Active:    Other Topics Concern  . Not on file   Social History Narrative  . No narrative on file    Past Surgical History  Procedure Date  . Abdominal hysterectomy   . Oophorectomy     Family History  Problem Relation Age of Onset  . Diabetes Father     Allergies  Allergen Reactions  . Carisoprodol     REACTION: unspecified  . Cyclobenzaprine Hcl     REACTION: unspecified  . Penicillins     REACTION: itching  . Prednisone Itching    Current Outpatient Prescriptions on File Prior to Visit  Medication Sig Dispense Refill  . acetaminophen (TYLENOL) 650 MG CR tablet Take 650 mg by mouth as needed.        . magnesium oxide (MAG-OX 400) 400 MG tablet Take 1 tablet (400 mg total) by mouth 2 (two) times daily.      Marland Kitchen DISCONTD: levothyroxine (SYNTHROID, LEVOTHROID) 75 MCG tablet TAKE ONE TABLET BY MOUTH EVERY DAY  90 tablet  1    BP 126/70  Pulse 92  Temp 98.5 F (36.9 C) (Oral)  Wt 134 lb (60.782 kg)  SpO2 98%chart       Assessment & Plan:  Assessment: Otitis Externa, Otalgia  Plan:  Cortisporin 3 drops in left ear 4 times a day. Call the office if symptoms worsen or persist. Recheck as scheduled and as needed sooner.

## 2012-09-25 ENCOUNTER — Encounter: Payer: Self-pay | Admitting: Family Medicine

## 2012-09-25 ENCOUNTER — Encounter: Payer: Medicare Other | Admitting: Family Medicine

## 2012-09-25 ENCOUNTER — Encounter: Payer: Self-pay | Admitting: Internal Medicine

## 2012-09-25 ENCOUNTER — Encounter (INDEPENDENT_AMBULATORY_CARE_PROVIDER_SITE_OTHER): Payer: Medicare Other | Admitting: Internal Medicine

## 2012-09-25 VITALS — BP 132/78 | Temp 97.7°F | Wt 135.0 lb

## 2012-09-25 DIAGNOSIS — Z23 Encounter for immunization: Secondary | ICD-10-CM

## 2012-09-25 NOTE — Progress Notes (Signed)
Pt on another provider schedule, but provider not seeing pts. Pt placed on my schedule, but left prior to appointment with me.  This encounter was created in error - please disregard.

## 2012-09-29 ENCOUNTER — Encounter: Payer: Medicare Other | Admitting: Family Medicine

## 2012-09-29 NOTE — Progress Notes (Signed)
This encounter was created in error - please disregard.

## 2012-10-20 ENCOUNTER — Other Ambulatory Visit: Payer: Medicare Other

## 2012-11-06 NOTE — Progress Notes (Signed)
This encounter was created in error - please disregard.

## 2012-11-24 ENCOUNTER — Other Ambulatory Visit (INDEPENDENT_AMBULATORY_CARE_PROVIDER_SITE_OTHER): Payer: Medicare Other

## 2012-11-24 DIAGNOSIS — E039 Hypothyroidism, unspecified: Secondary | ICD-10-CM

## 2012-11-27 ENCOUNTER — Ambulatory Visit (INDEPENDENT_AMBULATORY_CARE_PROVIDER_SITE_OTHER): Payer: Medicare Other | Admitting: Family Medicine

## 2012-11-27 ENCOUNTER — Encounter: Payer: Self-pay | Admitting: Family Medicine

## 2012-11-27 VITALS — BP 124/72 | HR 93 | Temp 98.2°F | Wt 136.0 lb

## 2012-11-27 DIAGNOSIS — R05 Cough: Secondary | ICD-10-CM

## 2012-11-27 DIAGNOSIS — J069 Acute upper respiratory infection, unspecified: Secondary | ICD-10-CM

## 2012-11-27 DIAGNOSIS — R062 Wheezing: Secondary | ICD-10-CM

## 2012-11-27 MED ORDER — ALBUTEROL SULFATE HFA 108 (90 BASE) MCG/ACT IN AERS: 2.0000 | INHALATION_SPRAY | Freq: Four times a day (QID) | RESPIRATORY_TRACT | Status: DC | PRN

## 2012-11-27 MED ORDER — HYDROCODONE-HOMATROPINE 5-1.5 MG/5ML PO SYRP: 5.0000 mL | ORAL_SOLUTION | Freq: Three times a day (TID) | ORAL | Status: DC | PRN

## 2012-11-27 NOTE — Patient Instructions (Addendum)
INSTRUCTIONS FOR UPPER RESPIRATORY INFECTION:  -plenty of rest and fluids  -imodium or loperamide if needed for the diarrhea  -make sure nobody is smoking in the house   -nasal saline wash 2-3 times daily (use prepackaged nasal saline or bottled/distilled water if making your own)   -can use sinex nasal spray for drainage and nasal congestion - but do NOT use longer then 3-4 days  -can use tylenol or ibuprofen as directed for aches and sorethroat  -in the winter time, using a humidifier at night is helpful (please follow cleaning instructions)  -if you are taking a cough medication - use only as directed, may also try a teaspoon of honey to coat the throat and throat lozenges  -for sore throat, salt water gargles can help  -follow up if you have fevers, facial pain, tooth pain, difficulty breathing or are worsening or not getting better in 5-7 days and with your doctor as scheduled

## 2012-11-27 NOTE — Progress Notes (Signed)
Chief Complaint  Patient presents with  . Cough    body ahces, per pt feels like stabbin gin back, chest heavy     HPI: Acute visit for cough: -started: 1 day ago -symptoms:nasal congestion, sore throat, cough, drainage, little wheezing and tightness, muscle pains, nausea, diarrhea -denies:fever, SOB, NVD, tooth pain, strep or mono exposure -has tried: musinex, tylenol - none today -sick contacts: none known -Hx of: recurrent cough and wheezing - tx with steroids in the past about 1 year ago. Hx stable pulm nodule followed on CT scan by PCP per review of chart. Baseline meds - none. Has had inhaler on hand in the past to use when gets sick. -has taken hycodan in the past and this worked well for her - wants this today   ROS: See pertinent positives and negatives per HPI.  Past Medical History  Diagnosis Date  . OTH MALIG NEOPLASM SKIN UPPER LIMB INCL SHOULDER 08/08/2010  . HYPOTHYROIDISM 08/30/2006  . HYPERLIPIDEMIA 01/15/2008  . ANEMIA 01/15/2008  . DEPRESSION 05/05/2007  . ANEURYSM NOS 08/30/2006  . PULMONARY NODULE, SOLITARY 02/17/2009  . OSTEOPENIA 08/30/2006  . FASTING HYPERGLYCEMIA 05/05/2007    Family History  Problem Relation Age of Onset  . Diabetes Father     History   Social History  . Marital Status: Widowed    Spouse Name: N/A    Number of Children: N/A  . Years of Education: N/A   Social History Main Topics  . Smoking status: Never Smoker   . Smokeless tobacco: None  . Alcohol Use: No  . Drug Use: No  . Sexually Active:    Other Topics Concern  . None   Social History Narrative  . None    Current outpatient prescriptions:levothyroxine (SYNTHROID, LEVOTHROID) 75 MCG tablet, Take 1 tablet (75 mcg total) by mouth daily., Disp: 90 tablet, Rfl: 1;  magnesium oxide (MAG-OX 400) 400 MG tablet, Take 1 tablet (400 mg total) by mouth 2 (two) times daily., Disp: , Rfl: ;  acetaminophen (TYLENOL) 650 MG CR tablet, Take 650 mg by mouth as needed.  , Disp: , Rfl:    albuterol (PROVENTIL HFA;VENTOLIN HFA) 108 (90 BASE) MCG/ACT inhaler, Inhale 2 puffs into the lungs every 6 (six) hours as needed for wheezing., Disp: 1 Inhaler, Rfl: 0;  HYDROcodone-homatropine (HYCODAN) 5-1.5 MG/5ML syrup, Take 5 mLs by mouth every 8 (eight) hours as needed for cough., Disp: 120 mL, Rfl: 0  EXAM:  Filed Vitals:   11/27/12 1538  BP: 124/72  Pulse: 93  Temp: 98.2 F (36.8 C)    There is no height on file to calculate BMI.  GENERAL: vitals reviewed and listed above, alert, oriented, appears well hydrated and in no acute distress  HEENT: atraumatic, conjunttiva clear, no obvious abnormalities on inspection of external nose and ears, hearing aides removed, normal appearance of ear canals and TMs, clear nasal congestion, mild post oropharyngeal erythema with PND, no tonsillar edema or exudate, no sinus TTP  NECK: no obvious masses on inspection  LUNGS: clear to auscultation bilaterally, no wheezes, rales or rhonchi, good air movement  CV: HRRR, no peripheral edema  MS: moves all extremities without noticeable abnormality  PSYCH: pleasant and cooperative, no obvious depression or anxiety  ASSESSMENT AND PLAN:  Discussed the following assessment and plan:  1. Cough  HYDROcodone-homatropine (HYCODAN) 5-1.5 MG/5ML syrup  2. Wheezing symptom  albuterol (PROVENTIL HFA;VENTOLIN HFA) 108 (90 BASE) MCG/ACT inhaler  3. Upper respiratory infection  albuterol (PROVENTIL HFA;VENTOLIN HFA) 108 (  90 BASE) MCG/ACT inhaler   -likely viral illness, benign exam today and normal O2 sats. Advised supportive care and close follow up - already has appt with PCP on Monday. Hycodan as she has used this before for cough (discussed risks) and albuterol if any wheezing or SOB. -Patient advised to return or notify a doctor immediately if symptoms worsen or persist or new concerns arise.  Patient Instructions  INSTRUCTIONS FOR UPPER RESPIRATORY INFECTION:  -plenty of rest and  fluids  -imodium or loperamide if needed for the diarrhea  -make sure nobody is smoking in the house   -nasal saline wash 2-3 times daily (use prepackaged nasal saline or bottled/distilled water if making your own)   -can use sinex nasal spray for drainage and nasal congestion - but do NOT use longer then 3-4 days  -can use tylenol or ibuprofen as directed for aches and sorethroat  -in the winter time, using a humidifier at night is helpful (please follow cleaning instructions)  -if you are taking a cough medication - use only as directed, may also try a teaspoon of honey to coat the throat and throat lozenges  -for sore throat, salt water gargles can help  -follow up if you have fevers, facial pain, tooth pain, difficulty breathing or are worsening or not getting better in 5-7 days and with your doctor as scheduled      KIM, Dahlia Client R.

## 2012-12-01 ENCOUNTER — Ambulatory Visit (INDEPENDENT_AMBULATORY_CARE_PROVIDER_SITE_OTHER): Payer: Medicare Other | Admitting: Internal Medicine

## 2012-12-01 ENCOUNTER — Encounter: Payer: Self-pay | Admitting: Internal Medicine

## 2012-12-01 VITALS — BP 130/72 | HR 67 | Temp 97.9°F | Wt 135.0 lb

## 2012-12-01 DIAGNOSIS — J40 Bronchitis, not specified as acute or chronic: Secondary | ICD-10-CM

## 2012-12-01 MED ORDER — DOXYCYCLINE HYCLATE 100 MG PO TABS: 100.0000 mg | ORAL_TABLET | Freq: Two times a day (BID) | ORAL | Status: AC

## 2012-12-01 NOTE — Progress Notes (Signed)
Patient ID: Kimberly Maynard, female   DOB: September 14, 1928, 79 y.o.   MRN: [id number] Still with cough and wheeze Reviewed previous notes  She is around a lot of second hand smoke..  Reviewed pmh, psh, soc hx   patient denies chest pain, shortness of breath, orthopnea. Denies lower extremity edema, abdominal pain, change in appetite, change in bowel movements. Patient denies rashes, musculoskeletal complaints. No other specific complaints in a complete review of systems.    Well-developed well-nourished female in no acute distress. HEENT exam atraumatic, normocephalic, extraocular muscles are intact. Neck is supple. No jugular venous distention no thyromegaly. Chest with bilateral rhonchi, without increased work of breathing. Cardiac exam S1 and S2 are regular.   A/p- bronchitis- trial doxycycline Continue other supportive care

## 2012-12-08 ENCOUNTER — Encounter: Payer: Self-pay | Admitting: Family Medicine

## 2012-12-08 ENCOUNTER — Ambulatory Visit (INDEPENDENT_AMBULATORY_CARE_PROVIDER_SITE_OTHER): Payer: Medicare Other | Admitting: Family Medicine

## 2012-12-08 VITALS — BP 120/70 | HR 80 | Temp 97.4°F | Wt 133.0 lb

## 2012-12-08 DIAGNOSIS — N8111 Cystocele, midline: Secondary | ICD-10-CM

## 2012-12-08 DIAGNOSIS — IMO0002 Reserved for concepts with insufficient information to code with codable children: Secondary | ICD-10-CM

## 2012-12-08 DIAGNOSIS — K5900 Constipation, unspecified: Secondary | ICD-10-CM

## 2012-12-08 NOTE — Progress Notes (Signed)
Chief Complaint  Patient presents with  . Hernia    been coughing and has a lump; gas and constipation     HPI:  Acute visit for ? Hernia and constipation:  ? Hernia: -felt something pooching out in vaginal area 1 week ago and yesterday when straining to have BM -wonders if hernia -denies: pain, urinary symptoms, incontinence, hx UTIs -hy complete hysterectomy  Constipation: -on and off chornically -thinks recent doxy worsened this -sometimes strains to have BM then has hard small BM, then sometimes normal BMs -reports told to take mirilax for this in the past   ROS: See pertinent positives and negatives per HPI.  Past Medical History  Diagnosis Date  . OTH MALIG NEOPLASM SKIN UPPER LIMB INCL SHOULDER 08/08/2010  . HYPOTHYROIDISM 08/30/2006  . HYPERLIPIDEMIA 01/15/2008  . ANEMIA 01/15/2008  . DEPRESSION 05/05/2007  . ANEURYSM NOS 08/30/2006  . PULMONARY NODULE, SOLITARY 02/17/2009  . OSTEOPENIA 08/30/2006  . FASTING HYPERGLYCEMIA 05/05/2007    Family History  Problem Relation Age of Onset  . Diabetes Father     History   Social History  . Marital Status: Widowed    Spouse Name: N/A    Number of Children: N/A  . Years of Education: N/A   Social History Main Topics  . Smoking status: Never Smoker   . Smokeless tobacco: None  . Alcohol Use: No  . Drug Use: No  . Sexually Active:    Other Topics Concern  . None   Social History Narrative  . None    Current outpatient prescriptions:acetaminophen (TYLENOL) 650 MG CR tablet, Take 650 mg by mouth as needed.  , Disp: , Rfl: ;  albuterol (PROVENTIL HFA;VENTOLIN HFA) 108 (90 BASE) MCG/ACT inhaler, Inhale 2 puffs into the lungs every 6 (six) hours as needed for wheezing., Disp: 1 Inhaler, Rfl: 0;  doxycycline (VIBRA-TABS) 100 MG tablet, Take 1 tablet (100 mg total) by mouth 2 (two) times daily., Disp: 20 tablet, Rfl: 0 HYDROcodone-homatropine (HYCODAN) 5-1.5 MG/5ML syrup, Take 5 mLs by mouth every 8 (eight) hours as needed  for cough., Disp: 120 mL, Rfl: 0;  levothyroxine (SYNTHROID, LEVOTHROID) 75 MCG tablet, Take 1 tablet (75 mcg total) by mouth daily., Disp: 90 tablet, Rfl: 1  EXAM:  Filed Vitals:   12/08/12 0928  BP: 120/70  Pulse: 80  Temp: 97.4 F (36.3 C)    There is no height on file to calculate BMI.  GENERAL: vitals reviewed and listed above, alert, oriented, appears well hydrated and in no acute distress  HEENT: atraumatic, conjunttiva clear, no obvious abnormalities on inspection of external nose and ears  NECK: no obvious masses on inspection  LUNGS: clear to auscultation bilaterally, no wheezes, rales or rhonchi, good air movement  CV: HRRR, no peripheral edema  ABD: soft, NTTP, BS +  GU: no abnormality on external exam with and without straining, does have mild vaginal atrophy, weakening of pelvic floor with mild cystocele  MS: moves all extremities without noticeable abnormality  PSYCH: pleasant and cooperative, no obvious depression or anxiety  ASSESSMENT AND PLAN:  Discussed the following assessment and plan:  1. Constipation   2. Bladder cystocele    -will tx constipation and do pelvic floor exercises with follow up - discussed other options for tx as well and return precuations -Patient advised to return or notify a doctor immediately if symptoms worsen or persist or new concerns arise.  There are no Patient Instructions on file for this visit.   Kriste Basque  R.

## 2012-12-08 NOTE — Patient Instructions (Addendum)
-take metameucil daily  -drink prune juice (one glass) a few times per week  -if remain constipated can use mirilax per instructions for 3 days  -do pelvic floor exercises  -follow up in 1 month or sooner if concerns  Prolapse  Prolapse means the falling down, bulging, dropping, or drooping of a body part. Organs that commonly prolapse include the rectum, small intestine, bladder, urethra, vagina (birth canal), uterus (womb), and cervix. Prolapse occurs when the ligaments and muscle tissue around the rectum, bladder, and uterus are damaged or weakened.  CAUSES  This happens especially with:  Childbirth. Some women feel pelvic pressure or have trouble holding their urine right after childbirth, because of stretching and tearing of pelvic tissues. This generally gets better with time and the feeling usually goes away, but it may return with aging.  Chronic heavy lifting.  Aging.  Menopause, with loss of estrogen production weakening the pelvic ligaments and muscles.  Past pelvic surgery.  Obesity.  Chronic constipation.  Chronic cough. Prolapse may affect a single organ, or several organs may prolapse at the same time. The front wall of the vagina holds up the bladder. The back wall holds up part of the lower intestine, or rectum. The uterus fills a spot in the middle. All these organs can be involved when the ligaments and muscles around the vagina relax too much. This often gets worse when women stop producing estrogen (menopause). SYMPTOMS  Uncontrolled loss of urine (incontinence) with cough, sneeze, straining, and exercise.  More force may be required to have a bowel movement, due to trapping of the stool.  When part of an organ bulges through the opening of the vagina, there is sometimes a feeling of heaviness or pressure. It may feel as though something is falling out. This sensation increases with coughing or bearing down.  If the organs protrude through the opening of  the vagina and rub against the clothing, there may be soreness, ulcers, infection, pain, and bleeding.  Lower back pain.  Pushing in the upper or lower part of the vagina, to pass urine or have a bowel movement.  Problems having sexual intercourse.  Being unable to insert a tampon or applicator. DIAGNOSIS  Usually, a physical exam is all that is needed to identify the problem. During the examination, you may be asked to cough and strain while lying down, sitting up, and standing up. Your caregiver will determine if more testing is required, such as bladder function tests. Some diagnoses are:  Cystocele: Bulging and falling of the bladder into the top of the vagina.  Rectocele: Part of the rectum bulging into the vagina.  Prolapse of the uterus: The uterus falls or drops into the vagina.  Enterocele: Bulging of the top of the vagina, after a hysterectomy (uterus removal), with the small intestine bulging into the vagina. A hernia in the top of the vagina.  Urethrocele: The urethra (urine carrying tube) bulging into the vagina. TREATMENT  In most cases, prolapse needs to be treated only if it produces symptoms. If the symptoms are interfering with your usual daily or sexual activities, treatment may be necessary. The following are some measures that may be used to treat prolapse.  Estrogen may help elderly women with mild prolapse.  Kegel exercises may help mild cases of prolapse, by strengthening and tightening the muscles of the pelvic floor.  Pessaries are used in women who choose not to, or are unable to, have surgery. A pessary is a doughnut-shaped piece of  plastic or rubber that is put into the vagina to keep the organs in place. This device must be fitted by your caregiver. Your caregiver will also explain how to care for yourself with the pessary. If it works well for you, this may be the only treatment required.  Surgery is often the only form of treatment for more severe  prolapses. There are different types of surgery available. You should discuss what the best procedure is for you. If the uterus is prolapsed, it may be removed (hysterectomy) as part of the surgical treatment. Your caregiver will discuss the risks and benefits with you.  Uterine-vaginal suspension (surgery to hold up the organs) may be used, especially if you want to maintain your fertility. No form of treatment is guaranteed to correct the prolapse or relieve the symptoms. HOME CARE INSTRUCTIONS   Wear a sanitary pad or absorbent product if you have incontinence of urine.  Avoid heavy lifting and straining with exercise and work.  Take over-the-counter pain medicine for minor discomfort.  Try taking estrogen or using estrogen vaginal cream.  Try Kegel exercises or use a pessary, before deciding to have surgery.  Do Kegel exercises after having a baby. SEEK MEDICAL CARE IF:   Your symptoms interfere with your daily activities.  You need medicine to help with the discomfort.  You need to be fitted with a pessary.  You notice bleeding from the vagina.  You think you have ulcers or you notice ulcers on the cervix.  You have an oral temperature above 102 F (38.9 C).  You develop pain or blood with urination.  You have bleeding with a bowel movement.  The symptoms are interfering with your sex life.  You have urinary incontinence that interferes with your daily activities.  You lose urine with sexual intercourse.  You have a chronic cough.  You have chronic constipation. Document Released: 05/19/2003 Document Revised: 02/04/2012 Document Reviewed: 11/27/2009 [facility] Patient Information 2013 [place], [place].

## 2012-12-22 ENCOUNTER — Ambulatory Visit: Payer: Medicare Other | Admitting: Internal Medicine

## 2013-01-12 ENCOUNTER — Encounter: Payer: Medicare Other | Admitting: Family Medicine

## 2013-01-12 DIAGNOSIS — Z0289 Encounter for other administrative examinations: Secondary | ICD-10-CM

## 2013-01-12 NOTE — Progress Notes (Signed)
No show or late cancel  This encounter was created in error - please disregard. 

## 2013-02-07 ENCOUNTER — Other Ambulatory Visit: Payer: Self-pay | Admitting: Internal Medicine

## 2013-05-03 ENCOUNTER — Other Ambulatory Visit: Payer: Self-pay | Admitting: Internal Medicine

## 2013-06-18 ENCOUNTER — Encounter: Payer: Self-pay | Admitting: Family Medicine

## 2013-06-18 ENCOUNTER — Ambulatory Visit (INDEPENDENT_AMBULATORY_CARE_PROVIDER_SITE_OTHER): Payer: Medicare Other | Admitting: Family Medicine

## 2013-06-18 VITALS — BP 138/88 | Temp 97.5°F | Wt 134.0 lb

## 2013-06-18 DIAGNOSIS — H9202 Otalgia, left ear: Secondary | ICD-10-CM

## 2013-06-18 DIAGNOSIS — H9209 Otalgia, unspecified ear: Secondary | ICD-10-CM

## 2013-06-18 DIAGNOSIS — J069 Acute upper respiratory infection, unspecified: Secondary | ICD-10-CM

## 2013-06-18 DIAGNOSIS — J309 Allergic rhinitis, unspecified: Secondary | ICD-10-CM

## 2013-06-18 MED ORDER — OFLOXACIN 0.3 % OT SOLN: 10.0000 [drp] | Freq: Every day | OTIC | Status: DC

## 2013-06-18 NOTE — Progress Notes (Signed)
Chief Complaint  Patient presents with  . Otalgia    left ear; headache; worse today     HPI:  L ear pain: -started 2 days ago -symptoms: ear pain, nasal congestion,sneezing -denies: fevers, chills, drainage, SOB, tooth pain, vomiting, recent swimming, decreased hearing from baseline -she does use qtips to dig out wax  ROS: See pertinent positives and negatives per HPI.  Past Medical History  Diagnosis Date  . OTH MALIG NEOPLASM SKIN UPPER LIMB INCL SHOULDER 08/08/2010  . HYPOTHYROIDISM 08/30/2006  . HYPERLIPIDEMIA 01/15/2008  . ANEMIA 01/15/2008  . DEPRESSION 05/05/2007  . ANEURYSM NOS 08/30/2006  . PULMONARY NODULE, SOLITARY 02/17/2009  . OSTEOPENIA 08/30/2006  . FASTING HYPERGLYCEMIA 05/05/2007    Family History  Problem Relation Age of Onset  . Diabetes Father     History   Social History  . Marital Status: Widowed    Spouse Name: N/A    Number of Children: N/A  . Years of Education: N/A   Social History Main Topics  . Smoking status: Never Smoker   . Smokeless tobacco: None  . Alcohol Use: No  . Drug Use: No  . Sexually Active:    Other Topics Concern  . None   Social History Narrative  . None    Current outpatient prescriptions:acetaminophen (TYLENOL) 650 MG CR tablet, Take 650 mg by mouth as needed.  , Disp: , Rfl: ;  albuterol (PROVENTIL HFA;VENTOLIN HFA) 108 (90 BASE) MCG/ACT inhaler, Inhale 2 puffs into the lungs every 6 (six) hours as needed for wheezing., Disp: 1 Inhaler, Rfl: 0;  levothyroxine (SYNTHROID, LEVOTHROID) 75 MCG tablet, TAKE ONE TABLET BY MOUTH ONCE DAILY., Disp: 90 tablet, Rfl: 0 HYDROcodone-homatropine (HYCODAN) 5-1.5 MG/5ML syrup, Take 5 mLs by mouth every 8 (eight) hours as needed for cough., Disp: 120 mL, Rfl: 0;  ofloxacin (FLOXIN) 0.3 % otic solution, Place 10 drops into the left ear daily., Disp: 5 mL, Rfl: 0  EXAM:  Filed Vitals:   06/18/13 0923  BP: 138/88  Temp: 97.5 F (36.4 C)    Body mass index is 24.5  kg/(m^2).  GENERAL: vitals reviewed and listed above, alert, oriented, appears well hydrated and in no acute distress  HEENT: atraumatic, conjunttiva clear, no obvious abnormalities on inspection of external nose and ears, normal appearance of ear canals and TMs except small scratch L ear with a little redness with small amount of soft wax, clear nasal congestion with boggy turbinates, mild post oropharyngeal erythema with PND, no tonsillar edema or exudate, no sinus TTP  NECK: no obvious masses on inspection  LUNGS: clear to auscultation bilaterally, no wheezes, rales or rhonchi, good air movement  CV: HRRR, no peripheral edema  MS: moves all extremities without noticeable abnormality  PSYCH: pleasant and cooperative, no obvious depression or anxiety  ASSESSMENT AND PLAN:  Discussed the following assessment and plan:  Ear pain, left - Plan: ofloxacin (FLOXIN) 0.3 % otic solution  Upper respiratory infection  Allergic rhinitis  -soft wax removed gently with soft curetter, for possible mild otitis externa L ofloxacin ear drops -advised trying claritin for AR -Patient advised to return or notify a doctor immediately if symptoms worsen or persist or new concerns arise.  Patient Instructions  -use ear drops 10 drops daily for 7 days in left ear  -try claritin once daily for your allergies  -follow up if worsening or symptoms persist     Kimberly Maynard, Kimberly R.

## 2013-06-18 NOTE — Patient Instructions (Addendum)
-  use ear drops 10 drops daily for 7 days in left ear  -try claritin once daily for your allergies  -follow up if worsening or symptoms persist

## 2013-08-14 ENCOUNTER — Other Ambulatory Visit: Payer: Self-pay | Admitting: Internal Medicine

## 2013-08-22 ENCOUNTER — Other Ambulatory Visit: Payer: Self-pay | Admitting: Internal Medicine

## 2013-09-16 ENCOUNTER — Ambulatory Visit (INDEPENDENT_AMBULATORY_CARE_PROVIDER_SITE_OTHER): Payer: Medicare Other | Admitting: Internal Medicine

## 2013-09-16 ENCOUNTER — Encounter: Payer: Self-pay | Admitting: Internal Medicine

## 2013-09-16 VITALS — BP 144/74 | HR 89 | Temp 97.7°F | Wt 135.0 lb

## 2013-09-16 DIAGNOSIS — I729 Aneurysm of unspecified site: Secondary | ICD-10-CM

## 2013-09-16 DIAGNOSIS — IMO0002 Reserved for concepts with insufficient information to code with codable children: Secondary | ICD-10-CM

## 2013-09-16 DIAGNOSIS — E785 Hyperlipidemia, unspecified: Secondary | ICD-10-CM

## 2013-09-16 DIAGNOSIS — R7989 Other specified abnormal findings of blood chemistry: Secondary | ICD-10-CM

## 2013-09-16 DIAGNOSIS — N8111 Cystocele, midline: Secondary | ICD-10-CM

## 2013-09-16 DIAGNOSIS — R7309 Other abnormal glucose: Secondary | ICD-10-CM

## 2013-09-16 DIAGNOSIS — Z23 Encounter for immunization: Secondary | ICD-10-CM

## 2013-09-16 DIAGNOSIS — E039 Hypothyroidism, unspecified: Secondary | ICD-10-CM

## 2013-09-16 LAB — BASIC METABOLIC PANEL
BUN: 14 mg/dL (ref 6–23)
CO2: 27 mEq/L (ref 19–32)
Calcium: 9.3 mg/dL (ref 8.4–10.5)
Chloride: 105 mEq/L (ref 96–112)
Glucose, Bld: 86 mg/dL (ref 70–99)
Potassium: 4.1 mEq/L (ref 3.5–5.1)

## 2013-09-16 LAB — HEMOGLOBIN A1C: Hgb A1c MFr Bld: 5.6 % (ref 4.6–6.5)

## 2013-09-16 NOTE — Assessment & Plan Note (Signed)
Check tsh 

## 2013-09-16 NOTE — Assessment & Plan Note (Signed)
Check labs 

## 2013-09-16 NOTE — Progress Notes (Signed)
Splenic aneurysm-- reviewed imaging studies  Hyperglycemia- Reviewed labs Lipids- no treatment  Hypothyroid.  Lab Results  Component Value Date   TSH 0.89 11/24/2012    Reviewed immunizations  Past Medical History  Diagnosis Date  . OTH MALIG NEOPLASM SKIN UPPER LIMB INCL SHOULDER 08/08/2010  . HYPOTHYROIDISM 08/30/2006  . HYPERLIPIDEMIA 01/15/2008  . ANEMIA 01/15/2008  . DEPRESSION 05/05/2007  . ANEURYSM NOS 08/30/2006  . PULMONARY NODULE, SOLITARY 02/17/2009  . OSTEOPENIA 08/30/2006  . FASTING HYPERGLYCEMIA 05/05/2007    History   Social History  . Marital Status: Widowed    Spouse Name: N/A    Number of Children: N/A  . Years of Education: N/A   Occupational History  . Not on file.   Social History Main Topics  . Smoking status: Never Smoker   . Smokeless tobacco: Not on file  . Alcohol Use: No  . Drug Use: No  . Sexual Activity:    Other Topics Concern  . Not on file   Social History Narrative  . No narrative on file    Past Surgical History  Procedure Laterality Date  . Abdominal hysterectomy    . Oophorectomy      Family History  Problem Relation Age of Onset  . Diabetes Father     Allergies  Allergen Reactions  . Carisoprodol     REACTION: unspecified  . Cyclobenzaprine Hcl     REACTION: unspecified  . Penicillins     REACTION: itching  . Prednisone Itching    Current Outpatient Prescriptions on File Prior to Visit  Medication Sig Dispense Refill  . acetaminophen (TYLENOL) 650 MG CR tablet Take 650 mg by mouth as needed.        Marland Kitchen albuterol (PROVENTIL HFA;VENTOLIN HFA) 108 (90 BASE) MCG/ACT inhaler Inhale 2 puffs into the lungs every 6 (six) hours as needed for wheezing.  1 Inhaler  0  . levothyroxine (SYNTHROID, LEVOTHROID) 75 MCG tablet TAKE ONE TABLET BY MOUTH ONCE DAILY  90 tablet  0  . ofloxacin (FLOXIN) 0.3 % otic solution Place 10 drops into the left ear daily.  5 mL  0   No current facility-administered medications on file prior to  visit.     patient denies chest pain, shortness of breath, orthopnea. Denies lower extremity edema, abdominal pain, change in appetite, change in bowel movements. Patient denies rashes, musculoskeletal complaints. No other specific complaints in a complete review of systems.   BP 144/74  Pulse 89  Temp(Src) 97.7 F (36.5 C) (Oral)  Wt 135 lb (61.236 kg)  BMI 24.69 kg/m2  SpO2 97%   elderly female in no acute distress. HEENT exam atraumatic, normocephalic, extraocular muscles are intact. Neck is supple. No jugular venous distention no thyromegaly. Chest clear to auscultation without increased work of breathing. Cardiac exam S1 and S2 are regular. Abdominal exam active bowel sounds, soft, nontender. Extremities no edema. Neurologic exam she is alert without any motor sensory deficits. Gait is normal.

## 2013-09-16 NOTE — Assessment & Plan Note (Signed)
Previously controlled No need to check

## 2013-09-17 ENCOUNTER — Encounter: Payer: Self-pay | Admitting: Internal Medicine

## 2014-02-08 ENCOUNTER — Ambulatory Visit (INDEPENDENT_AMBULATORY_CARE_PROVIDER_SITE_OTHER)
Admission: RE | Admit: 2014-02-08 | Discharge: 2014-02-08 | Disposition: A | Discharge status: Home / Self Care | Payer: Medicare Other | Source: Ambulatory Visit | Attending: Family Medicine | Admitting: Family Medicine

## 2014-02-08 ENCOUNTER — Ambulatory Visit (INDEPENDENT_AMBULATORY_CARE_PROVIDER_SITE_OTHER): Payer: Medicare Other | Admitting: Family Medicine

## 2014-02-08 ENCOUNTER — Encounter: Payer: Self-pay | Admitting: Family Medicine

## 2014-02-08 VITALS — BP 142/80 | Temp 97.9°F | Wt 133.0 lb

## 2014-02-08 DIAGNOSIS — M25569 Pain in unspecified knee: Secondary | ICD-10-CM

## 2014-02-08 DIAGNOSIS — R0781 Pleurodynia: Secondary | ICD-10-CM

## 2014-02-08 DIAGNOSIS — R079 Chest pain, unspecified: Secondary | ICD-10-CM

## 2014-02-08 NOTE — Patient Instructions (Signed)
-  get the xrays as instructed  -ice to bruise on R leg  -follow up as needed

## 2014-02-08 NOTE — Progress Notes (Signed)
Pre visit review using our clinic review tool, if applicable. No additional management support is needed unless otherwise documented below in the visit note.,  

## 2014-02-08 NOTE — Progress Notes (Signed)
Chief Complaint  Patient presents with  . Fall    right sided pain     HPI:  Acute visit for:   s/p fall: -last night tripped on broken driveway and fell on R side in the grass -having some pain in the R side, pain with deep breaths, R hip and R leg and knee -can breath ok, can bear weight but limping -denies: LOC, head injury, neck or head pain -pain relieved with tylenol  ROS: See pertinent positives and negatives per HPI.  Past Medical History  Diagnosis Date  . OTH MALIG NEOPLASM SKIN UPPER LIMB INCL SHOULDER 08/08/2010  . HYPOTHYROIDISM 08/30/2006  . HYPERLIPIDEMIA 01/15/2008  . ANEMIA 01/15/2008  . DEPRESSION 05/05/2007  . ANEURYSM NOS 08/30/2006  . PULMONARY NODULE, SOLITARY 02/17/2009  . OSTEOPENIA 08/30/2006  . FASTING HYPERGLYCEMIA 05/05/2007    Past Surgical History  Procedure Laterality Date  . Abdominal hysterectomy    . Oophorectomy      Family History  Problem Relation Age of Onset  . Diabetes Father     History   Social History  . Marital Status: Widowed    Spouse Name: N/A    Number of Children: N/A  . Years of Education: N/A   Social History Main Topics  . Smoking status: Never Smoker   . Smokeless tobacco: None  . Alcohol Use: No  . Drug Use: No  . Sexual Activity:    Other Topics Concern  . None   Social History Narrative  . None    Current outpatient prescriptions:acetaminophen (TYLENOL) 650 MG CR tablet, Take 650 mg by mouth as needed.  , Disp: , Rfl: ;  albuterol (PROVENTIL HFA;VENTOLIN HFA) 108 (90 BASE) MCG/ACT inhaler, Inhale 2 puffs into the lungs every 6 (six) hours as needed for wheezing., Disp: 1 Inhaler, Rfl: 0;  levothyroxine (SYNTHROID, LEVOTHROID) 75 MCG tablet, TAKE ONE TABLET BY MOUTH ONCE DAILY, Disp: 90 tablet, Rfl: 0 Magnesium-Zinc (MAGNESIUM-CHELATED ZINC PO), Take by mouth daily., Disp: , Rfl: ;  ofloxacin (FLOXIN) 0.3 % otic solution, Place 10 drops into the left ear daily., Disp: 5 mL, Rfl: 0  EXAM:  Filed Vitals:    02/08/14 1432  BP: 142/80  Temp: 97.9 F (36.6 C)    Body mass index is 24.32 kg/(m^2).  GENERAL: vitals reviewed and listed above, alert, oriented, appears well hydrated and in no acute distress  HEENT: atraumatic, conjunttiva clear, no obvious abnormalities on inspection of external nose and ears  NECK: no obvious masses on inspection  LUNGS: clear to auscultation bilaterally, no wheezes, rales or rhonchi, good air movement  CV: HRRR, no peripheral edema  MS: Antalgic gait TTP R lower ant ribs, R hip over greater trochanter and bruising over this area, pain with int/ext rot of hip, pain over soft tissue of ant thigh and diffusely around R knee though visual inspection of these areas is normal with no instability of knee  PSYCH: pleasant and cooperative, no obvious depression or anxiety  ASSESSMENT AND PLAN:  Discussed the following assessment and plan:  Pain in joint, lower leg - Plan: DG Hip Complete Right, DG Knee Complete 4 Views Right  Rib pain - Plan: DG Chest 2 View  -soft tissue contusion suspected but will obtain plain films to exclude rx instructed to get these done today -Patient advised to return or notify a doctor immediately if symptoms worsen or persist or new concerns arise.  Patient Instructions  -get the xrays as instructed  -ice to bruise  on R leg  -follow up as needed     Nathalee Smarr, Jarrett Soho R.

## 2014-02-10 ENCOUNTER — Telehealth: Payer: Self-pay | Admitting: Internal Medicine

## 2014-02-10 NOTE — Telephone Encounter (Signed)
Pt calling back for xray results. Pt saw dr Maudie Mercury

## 2014-02-11 NOTE — Telephone Encounter (Signed)
Spoke with pt with xray results.

## 2014-02-22 ENCOUNTER — Other Ambulatory Visit: Payer: Self-pay | Admitting: Internal Medicine

## 2014-05-12 ENCOUNTER — Encounter: Payer: Self-pay | Admitting: Internal Medicine

## 2014-05-12 ENCOUNTER — Ambulatory Visit (INDEPENDENT_AMBULATORY_CARE_PROVIDER_SITE_OTHER): Payer: Medicare Other | Admitting: Internal Medicine

## 2014-05-12 VITALS — BP 130/78 | HR 72 | Temp 97.9°F | Ht 62.0 in | Wt 128.0 lb

## 2014-05-12 DIAGNOSIS — E039 Hypothyroidism, unspecified: Secondary | ICD-10-CM

## 2014-05-12 LAB — TSH: TSH: 0.42 u[IU]/mL (ref 0.35–4.50)

## 2014-05-12 MED ORDER — LEVOTHYROXINE SODIUM 75 MCG PO TABS: ORAL_TABLET | ORAL | Status: DC

## 2014-05-12 NOTE — Progress Notes (Signed)
Feels well Hypothyroid- needs f/u  Reviewed meds Reviewed pmh   Well-developed well-nourished female in no acute distress. HEENT exam atraumatic, normocephalic, extraocular muscles are intact. Neck is supple. No jugular venous distention no thyromegaly. Chest clear to auscultation without increased work of breathing. Cardiac exam S1 and S2 are regular. Abdominal exam active bowel sounds, soft, nontender. Extremities no edema. Neurologic exam she is alert without any motor sensory deficits. Gait is normal.   HYPOTHYROIDISM Will check labs today  She is otherwise doing well

## 2014-05-12 NOTE — Progress Notes (Signed)
Pre visit review using our clinic review tool, if applicable. No additional management support is needed unless otherwise documented below in the visit note. 

## 2014-05-15 NOTE — Assessment & Plan Note (Signed)
Will check labs today  She is otherwise doing well

## 2014-06-24 ENCOUNTER — Telehealth: Payer: Self-pay | Admitting: Internal Medicine

## 2014-06-24 NOTE — Telephone Encounter (Signed)
sure

## 2014-06-24 NOTE — Telephone Encounter (Signed)
Pt would like to switch to dr kim. Can I sch? °

## 2014-06-28 NOTE — Telephone Encounter (Signed)
lmom for pt to cb

## 2014-07-07 NOTE — Telephone Encounter (Signed)
lmom for pt to cb

## 2014-07-09 NOTE — Telephone Encounter (Signed)
Pt has been sch

## 2014-08-16 ENCOUNTER — Ambulatory Visit (INDEPENDENT_AMBULATORY_CARE_PROVIDER_SITE_OTHER): Payer: Medicare Other | Admitting: Family Medicine

## 2014-08-16 ENCOUNTER — Encounter: Payer: Self-pay | Admitting: Family Medicine

## 2014-08-16 VITALS — BP 150/82 | HR 72 | Temp 97.3°F | Ht 59.5 in | Wt 132.0 lb

## 2014-08-16 DIAGNOSIS — E039 Hypothyroidism, unspecified: Secondary | ICD-10-CM

## 2014-08-16 DIAGNOSIS — E785 Hyperlipidemia, unspecified: Secondary | ICD-10-CM

## 2014-08-16 DIAGNOSIS — I728 Aneurysm of other specified arteries: Secondary | ICD-10-CM

## 2014-08-16 DIAGNOSIS — M1990 Unspecified osteoarthritis, unspecified site: Secondary | ICD-10-CM | POA: Insufficient documentation

## 2014-08-16 DIAGNOSIS — Z7689 Persons encountering health services in other specified circumstances: Secondary | ICD-10-CM

## 2014-08-16 DIAGNOSIS — J309 Allergic rhinitis, unspecified: Secondary | ICD-10-CM

## 2014-08-16 DIAGNOSIS — M159 Polyosteoarthritis, unspecified: Secondary | ICD-10-CM

## 2014-08-16 DIAGNOSIS — Z23 Encounter for immunization: Secondary | ICD-10-CM

## 2014-08-16 HISTORY — DX: Aneurysm of other specified arteries: I72.8

## 2014-08-16 NOTE — Progress Notes (Signed)
No chief complaint on file.   HPI:  Kimberly Maynard is here to establish care.  Here with daughter Kimberly Maynard whom she lives with Last PCP and physical:  Has the following chronic problems and concerns today:  Patient Active Problem List   Diagnosis Date Noted  . Splenic artery aneurysm - last CT 2012 stable 08/16/2014  . Osteoarthritis 08/16/2014  . HYPERLIPIDEMIA 01/15/2008  . HYPOTHYROIDISM 08/30/2006  . OSTEOPENIA 08/30/2006   Hypothyroid: -reports stable for a long time -denies: hot/cold intol, skin changes, palpitations Lab Results  Component Value Date   TSH 0.42 05/12/2014   OA of knees and back: -takes some tylenol for this - once daily -no regular exercise -denies: falls recently, worsening, weakness, numbness  Difficulty Hearing: -sees audiologist -trouble with the hearing aides for sometime, hears crackling in ears -has allergies, watery eyes, nasal congestion  Aymptomatic Splenic Artery Aneurysm: -stable last CT 2012  ROS negative for unless reported above: fevers, unintentional weight loss, hearing or vision loss, chest pain, palpitations, struggling to breath, hemoptysis, melena, hematochezia, hematuria, falls, loc, si, thoughts of self harm  Past Medical History  Diagnosis Date  . OTH MALIG NEOPLASM SKIN UPPER LIMB INCL SHOULDER 08/08/2010  . HYPOTHYROIDISM 08/30/2006  . HYPERLIPIDEMIA 01/15/2008  . ANEMIA 01/15/2008  . DEPRESSION 05/05/2007  . ANEURYSM NOS 08/30/2006  . PULMONARY NODULE, SOLITARY 02/17/2009  . OSTEOPENIA 08/30/2006  . FASTING HYPERGLYCEMIA 05/05/2007  . PULMONARY NODULE, SOLITARY - completed follow up, stable 02/17/2009    Qualifier: Diagnosis of  By: Burnice Logan  MD, Doretha Sou     Family History  Problem Relation Age of Onset  . Diabetes Father   . Diabetes Sister   . Cancer Brother     pancreatic, prostate  . Diabetes Brother     History   Social History  . Marital Status: Widowed    Spouse Name: N/A    Number of Children: N/A  .  Years of Education: N/A   Social History Main Topics  . Smoking status: Never Smoker   . Smokeless tobacco: None  . Alcohol Use: No  . Drug Use: No  . Sexual Activity: None   Other Topics Concern  . None   Social History Narrative   Work or School: not work      Home Situation: living with daughter      Spiritual Beliefs: none      Lifestyle: works in the yard - no regular exercise; diet is good             Current outpatient prescriptions:acetaminophen (TYLENOL) 650 MG CR tablet, Take 650 mg by mouth as needed.  , Disp: , Rfl: ;  levothyroxine (SYNTHROID, LEVOTHROID) 75 MCG tablet, TAKE ONE TABLET BY MOUTH ONCE DAILY., Disp: 90 tablet, Rfl: 1;  Magnesium-Zinc (MAGNESIUM-CHELATED ZINC PO), Take by mouth daily., Disp: , Rfl: ;  sodium chloride (AYR) 0.65 % nasal spray, Place 1 spray into the nose as needed for congestion., Disp: , Rfl:   EXAM:  Filed Vitals:   08/16/14 1134  BP: 150/82  Pulse: 72  Temp: 97.3 F (36.3 C)    Body mass index is 26.23 kg/(m^2).  GENERAL: vitals reviewed and listed above, alert, oriented, appears well hydrated and in no acute distress  HEENT: atraumatic, conjunttiva clear, no obvious abnormalities on inspection of external nose and ears, normal appearance of ear canals and TMs, clear nasal congestion, mild post oropharyngeal erythema with PND, no tonsillar edema or exudate, no sinus TTP  NECK: no  obvious masses on inspection  LUNGS: clear to auscultation bilaterally, no wheezes, rales or rhonchi, good air movement  CV: HRRR, no peripheral edema  MS: moves all extremities without noticeable abnormality  PSYCH: pleasant and cooperative, no obvious depression or anxiety  ASSESSMENT AND PLAN:  Discussed the following assessment and plan:  Encounter to establish care -We reviewed the PMH, PSH, FH, SH, Meds and Allergies. -We provided refills for any medications we will prescribe as needed. -We addressed current concerns per orders and  patient instructions. -We have asked for records for pertinent exams, studies, vaccines and notes from previous providers. -We have advised patient to follow up per instructions below.  Splenic artery aneurysm - last CT 2012 stable -advised not set standards on monitoring or tx -asymptomatic -advised may wish to repeat imaging to assess stability  Osteoarthritis of multiple joints, unspecified osteoarthritis type - continue current tx  HYPOTHYROIDISM - continue current tx  Allergic rhinitis, unspecified allergic rhinitis type      -Patient advised to return or notify a doctor immediately if symptoms worsen or persist or new concerns arise.  Patient Instructions  -nasocort daily for 1 month for the ears - this is available over the counter  -follow up in 3 months for your [place]: []  exercise 30 minutes daily  []  go to bed and wake up at the same time  []  keep bedroom cool, dark and quiet  []  reserve bed for sleep - do not read, watch TV, etc in bed  []  If you toss and turn more then 15-20 minutes get out of bed and list thoughts/do quite activity then go back to bed; repeat as needed; do not worry about when you eventually fall asleep - still get up at the same time and turn on lights and take shower  [] get counseling  []  some people find that a half dose of benadryl, melatonin, tylenol pm or unisom on a few nights per week is helpful initially for a few weeks  [] seek help and treat any depression or anxiety  [] prescription strength sleep medications should only be used in severe cases of insomnia if other measures fail and should be used sparingly      Mekaylah Klich, Jarrett Soho R.

## 2014-08-16 NOTE — Patient Instructions (Signed)
-  nasocort daily for 1 month for the ears - this is available over the counter  -follow up in 3 months for your [place]: []  exercise 30 minutes daily  []  go to bed and wake up at the same time  []  keep bedroom cool, dark and quiet  []  reserve bed for sleep - do not read, watch TV, etc in bed  []  If you toss and turn more then 15-20 minutes get out of bed and list thoughts/do quite activity then go back to bed; repeat as needed; do not worry about when you eventually fall asleep - still get up at the same time and turn on lights and take shower  [] get counseling  []  some people find that a half dose of benadryl, melatonin, tylenol pm or unisom on a few nights per week is helpful initially for a few weeks  [] seek help and treat any depression or anxiety  [] prescription strength sleep medications should only be used in severe cases of insomnia if other measures fail and should be used sparingly

## 2014-08-16 NOTE — Progress Notes (Signed)
Pre visit review using our clinic review tool, if applicable. No additional management support is needed unless otherwise documented below in the visit note. 

## 2014-10-11 ENCOUNTER — Telehealth: Payer: Self-pay | Admitting: Family Medicine

## 2014-10-11 NOTE — Telephone Encounter (Signed)
Pt is in [facility] with her daughter. Pt fell and daughter took her to [facility] . They were she closed fracture left ankle. Pt was placed in a boot and were told they would send the xray results to Dr Maudie Mercury.    Pt daughter phone number  Len Blalock [phone]

## 2014-10-11 NOTE — Telephone Encounter (Signed)
Awwh. Poor thing - so sorry to hear. I hope she is doing ok - Needs appt.

## 2014-10-12 NOTE — Telephone Encounter (Signed)
Unable to leave a message for the pts daughter due to no voicemail being set up.

## 2014-10-14 NOTE — Telephone Encounter (Signed)
Patient informed and appt scheduled for 12/2 when she returns from [facility].

## 2014-10-27 ENCOUNTER — Encounter: Payer: Medicare Other | Admitting: Family Medicine

## 2014-10-27 NOTE — Progress Notes (Signed)
Error   This encounter was created in error - please disregard. 

## 2014-11-24 ENCOUNTER — Ambulatory Visit (INDEPENDENT_AMBULATORY_CARE_PROVIDER_SITE_OTHER): Payer: Medicare Other | Admitting: Family Medicine

## 2014-11-24 ENCOUNTER — Encounter: Payer: Self-pay | Admitting: Family Medicine

## 2014-11-24 VITALS — BP 138/70 | HR 88 | Temp 97.8°F | Ht 60.5 in | Wt 132.7 lb

## 2014-11-24 DIAGNOSIS — E785 Hyperlipidemia, unspecified: Secondary | ICD-10-CM

## 2014-11-24 DIAGNOSIS — E039 Hypothyroidism, unspecified: Secondary | ICD-10-CM

## 2014-11-24 DIAGNOSIS — Z0000 Encounter for general adult medical examination without abnormal findings: Secondary | ICD-10-CM

## 2014-11-24 DIAGNOSIS — Z23 Encounter for immunization: Secondary | ICD-10-CM

## 2014-11-24 LAB — LIPID PANEL
CHOLESTEROL: 181 mg/dL (ref 0–200)
HDL: 40.6 mg/dL (ref >39.00)
LDL Cholesterol: 113 mg/dL — ABNORMAL HIGH (ref 0–99)
NonHDL: 140.4
Total CHOL/HDL Ratio: 4
Triglycerides: 138 mg/dL (ref 0.0–149.0)
VLDL: 27.6 mg/dL (ref 0.0–40.0)

## 2014-11-24 LAB — TSH: TSH: 0.28 u[IU]/mL — ABNORMAL LOW (ref 0.35–4.50)

## 2014-11-24 NOTE — Progress Notes (Signed)
Pre visit review using our clinic review tool, if applicable. No additional management support is needed unless otherwise documented below in the visit note. 

## 2014-11-24 NOTE — Progress Notes (Signed)
Medicare Annual Preventive Care Visit  (initial annual wellness or annual wellness exam)  Concerns and/or follow up today:  URI: -started 2 days ago -daughter and grand-daughter with same -symptoms: congeston, cough, PND -denies: fevers, chills, body aches, SOB, NVD -hx allergies on allegra  L foot fracture: -in beginning of Nov after stepping off ladder wrong and twisted foot -seen at [facility] and put in boot which she used for few weeks - reports no pain, swelling or issues with this now  Hypothyroid: -reports stable for a long time -denies: hot/cold intol, skin changes, palpitations Lab Results  Component Value Date   TSH 0.42 05/12/2014   OA of knees and back: -takes some tylenol for this - once daily -no regular exercise -denies: falls recently, worsening, weakness, numbness  Difficulty Hearing: -sees audiologist -trouble with the hearing aides for sometime, hears crackling in ears -has allergies, watery eyes, nasal congestion  Aymptomatic Splenic Artery Aneurysm: -stable last CT 2012    ROS: negative for report of fevers, unintentional weight loss, vision changes, vision loss, hearing loss or change, chest pain, sob, hemoptysis, melena, hematochezia, hematuria, genital discharge or lesions, falls, bleeding or bruising, loc, thoughts of suicide or self harm, memory loss  1.) Patient-completed health risk assessment  - completed and reviewed, see scanned documentation  2.) Review of Medical History: -PMH, PSH, Family History and current specialty and care providers reviewed and updated and listed below  - see scanned in document in chart and below  Past Medical History  Diagnosis Date  . OTH MALIG NEOPLASM SKIN UPPER LIMB INCL SHOULDER 08/08/2010  . HYPOTHYROIDISM 08/30/2006  . HYPERLIPIDEMIA 01/15/2008  . ANEMIA 01/15/2008  . DEPRESSION 05/05/2007  . ANEURYSM NOS 08/30/2006  . PULMONARY NODULE, SOLITARY 02/17/2009  . OSTEOPENIA 08/30/2006  .  FASTING HYPERGLYCEMIA 05/05/2007  . PULMONARY NODULE, SOLITARY - completed follow up, stable 02/17/2009    Qualifier: Diagnosis of  By: Burnice Logan  MD, Doretha Sou     Past Surgical History  Procedure Laterality Date  . Abdominal hysterectomy    . Oophorectomy      History   Social History  . Marital Status: Widowed    Spouse Name: N/A    Number of Children: N/A  . Years of Education: N/A   Occupational History  . Not on file.   Social History Main Topics  . Smoking status: Never Smoker   . Smokeless tobacco: Not on file  . Alcohol Use: No  . Drug Use: No  . Sexual Activity: Not on file   Other Topics Concern  . Not on file   Social History Narrative   Work or School: not work      Home Situation: living with daughter      Spiritual Beliefs: none      Lifestyle: works in the yard - no regular exercise; diet is good             The patient has a family history of  3.) Review of functional ability and level of safety:  Any difficulty hearing? YES - sees audiologist  History of falling? YES - mechanical fall off ladder - see above  Any trouble with IADLs - using a phone, using transportation, grocery shopping, preparing meals, doing housework, doing laundry, taking medications and managing money? YES - daughter helps with most things as they live together and pt does not drive  Advance Directives?  NO  See summary of recommendations in Patient Instructions below.  4.) Physical Exam  Filed Vitals:   11/24/14 0938  BP: 138/70  Pulse: 88  Temp: 97.8 F (36.6 C)   Estimated body mass index is 25.48 kg/(m^2) as calculated from the following:   Height as of this encounter: 5' 0.5" (1.537 m).   Weight as of this encounter: 132 lb 11.2 oz (60.192 kg).  EKG (optional): deferred  General: alert, appear well hydrated and in no acute distress  HEENT: visual acuity grossly intact  CV: HRRR  Lungs: CTA bilaterally  Psych: pleasant and cooperative, no  obvious depression or anxiety  Cognition grossly intact.  See patient instructions for recommendations.  Education and counseling regarding the above review of health provided with a plan for the following: -see scanned patient completed form for further details -fall prevention strategies discussed  -healthy lifestyle discussed -importance and resources for completing advanced directives discussed -see patient instructions below for any other recommendations provided  4)The following written screening schedule of preventive measures were reviewed with assessment and plan made per below, orders and patient instructions:      AAA screening n/a     Alcohol screening done     Obesity Screening and counseling done     STI screening declined     Tobacco Screening done       Pneumococcal (PPSV23 -one dose after 64, one before if risk factors), influenza yearly and hepatitis B vaccines (if high risk - end stage renal disease, IV drugs, homosexual men, live in home for mentally retarded, hemophilia receiving factors) ASSESSMENT/PLAN: done      Screening mammograph (yearly if >40) ASSESSMENT/PLAN: daughter and pt report they do not wish to do these any longer      Screening Pap smear/pelvic exam (q2 years) ASSESSMENT/PLAN: N/A      Prostate cancer screening ASSESSMENT/PLAN:N/A      Colorectal cancer screening (FOBT yearly or flex sig q4y or colonoscopy q10y or barium enema q4y) ASSESSMENT/PLAN: completed 2009      Diabetes outpatient self-management training services ASSESSMENT/PLAN: N/A      Bone mass measurements(covered q2y if indicated - estrogen def, osteoporosis, hyperparathyroid, vertebral abnormalities, osteoporosis or steroids) ASSESSMENT/PLAN: completed tx with boniva 2012      Screening for glaucoma(q1y if high risk - diabetes, FH, AA and > 50 or hispanic and > 65) ASSESSMENT/PLAN: N/A      Medical nutritional therapy for individuals with diabetes or renal  disease ASSESSMENT/PLAN: N/A      Cardiovascular screening blood tests (lipids q5y) ASSESSMENT/PLAN: done 2013 - needs repeat      Diabetes screening tests ASSESSMENT/PLAN: done 2014 and ok   7.) Summary: -risk factors and conditions per above assessment were discussed and treatment, recommendations and referrals were offered per documentation above and orders and patient instructions.  Initial Medicare annual wellness visit  Hypothyroidism, unspecified hypothyroidism type - Plan: TSH  Hyperlipemia - Plan: Lipid Panel  Patient Instructions  BEFORE YOU LEAVE: -prevnar 57 -labs  Please see a lawyer and/or go to this website to help you with advanced directives and designating a health care power of attorney so that your wishes will be followed should you become too ill to make your own medical decisions.  [email or web address]

## 2014-11-24 NOTE — Addendum Note (Signed)
Addended by: Agnes Lawrence on: 11/24/2014 11:34 AM   Modules accepted: Orders

## 2014-11-24 NOTE — Patient Instructions (Signed)
BEFORE YOU LEAVE: -prevnar 13 -labs  Please see a lawyer and/or go to this website to help you with advanced directives and designating a health care power of attorney so that your wishes will be followed should you become too ill to make your own medical decisions.  [email or web address]

## 2014-12-02 ENCOUNTER — Other Ambulatory Visit: Payer: Self-pay | Admitting: *Deleted

## 2014-12-02 ENCOUNTER — Other Ambulatory Visit: Payer: Self-pay | Admitting: Internal Medicine

## 2014-12-02 DIAGNOSIS — E038 Other specified hypothyroidism: Secondary | ICD-10-CM

## 2014-12-02 NOTE — Telephone Encounter (Signed)
It appears her thyroid lab came back after other labs and showed mild overtreatment. Would advise decrease synthroid to 86mcg daily and sent this new dose. Advise lab only appointment in 2 months to recheck TSH. Thanks.

## 2014-12-02 NOTE — Telephone Encounter (Signed)
I called the pts daughter and informed her of this and she stated she will call back for the lab appt.

## 2015-02-07 ENCOUNTER — Ambulatory Visit (INDEPENDENT_AMBULATORY_CARE_PROVIDER_SITE_OTHER): Payer: Medicare Other | Admitting: Family Medicine

## 2015-02-07 ENCOUNTER — Encounter: Payer: Self-pay | Admitting: Family Medicine

## 2015-02-07 VITALS — BP 132/70 | HR 69 | Temp 97.4°F | Ht 60.5 in | Wt 134.7 lb

## 2015-02-07 DIAGNOSIS — E039 Hypothyroidism, unspecified: Secondary | ICD-10-CM | POA: Diagnosis not present

## 2015-02-07 DIAGNOSIS — H938X2 Other specified disorders of left ear: Secondary | ICD-10-CM | POA: Diagnosis not present

## 2015-02-07 DIAGNOSIS — H6982 Other specified disorders of Eustachian tube, left ear: Secondary | ICD-10-CM

## 2015-02-07 DIAGNOSIS — H6122 Impacted cerumen, left ear: Secondary | ICD-10-CM | POA: Diagnosis not present

## 2015-02-07 DIAGNOSIS — M159 Polyosteoarthritis, unspecified: Secondary | ICD-10-CM | POA: Diagnosis not present

## 2015-02-07 LAB — TSH: TSH: 4.44 u[IU]/mL (ref 0.35–4.50)

## 2015-02-07 NOTE — Progress Notes (Signed)
Pre visit review using our clinic review tool, if applicable. No additional management support is needed unless otherwise documented below in the visit note. 

## 2015-02-07 NOTE — Progress Notes (Signed)
HPI:  Hypothyroid: -TSH mildly low last check -denies: hot/cold intol, skin changes, palpitations  OA of knees and back: -takes some tylenol for this - once daily -no regular exercise -denies: falls recently, worsening, weakness, numbness  Difficulty Hearing: -sees audiologist -trouble with the hearing aides for sometime, hears crackling in ears -has allergies, watery eyes, nasal congestion -reports audiologist told her to get ear lavage  -she has pressure in R ear and crackling sound  Aymptomatic Splenic Artery Aneurysm: -stable last CT 2012   ROS: See pertinent positives and negatives per HPI.  Past Medical History  Diagnosis Date  . OTH MALIG NEOPLASM SKIN UPPER LIMB INCL SHOULDER 08/08/2010  . HYPOTHYROIDISM 08/30/2006  . HYPERLIPIDEMIA 01/15/2008  . ANEMIA 01/15/2008  . DEPRESSION 05/05/2007  . ANEURYSM NOS 08/30/2006  . PULMONARY NODULE, SOLITARY 02/17/2009  . OSTEOPENIA 08/30/2006  . FASTING HYPERGLYCEMIA 05/05/2007  . PULMONARY NODULE, SOLITARY - completed follow up, stable 02/17/2009    Qualifier: Diagnosis of  By: Burnice Logan  MD, Doretha Sou     Past Surgical History  Procedure Laterality Date  . Abdominal hysterectomy    . Oophorectomy      Family History  Problem Relation Age of Onset  . Diabetes Father   . Diabetes Sister   . Cancer Brother     pancreatic, prostate  . Diabetes Brother     History   Social History  . Marital Status: Widowed    Spouse Name: N/A  . Number of Children: N/A  . Years of Education: N/A   Social History Main Topics  . Smoking status: Never Smoker   . Smokeless tobacco: Not on file  . Alcohol Use: No  . Drug Use: No  . Sexual Activity: Not on file   Other Topics Concern  . None   Social History Narrative   Work or School: not work      Home Situation: living with daughter      Spiritual Beliefs: none      Lifestyle: works in the yard - no regular exercise; diet is good              Current outpatient  prescriptions:  .  acetaminophen (TYLENOL) 650 MG CR tablet, Take 650 mg by mouth as needed.  , Disp: , Rfl:  .  fexofenadine (ALLEGRA) 180 MG tablet, Take 180 mg by mouth daily., Disp: , Rfl:  .  levothyroxine (SYNTHROID, LEVOTHROID) 50 MCG tablet, Take 1 tablet (50 mcg total) by mouth daily., Disp: 30 tablet, Rfl: 3 .  Magnesium-Zinc (MAGNESIUM-CHELATED ZINC PO), Take by mouth daily., Disp: , Rfl:  .  sodium chloride (AYR) 0.65 % nasal spray, Place 1 spray into the nose as needed for congestion., Disp: , Rfl:   EXAM:  Filed Vitals:   02/07/15 1005  BP: 132/70  Pulse: 69  Temp: 97.4 F (36.3 C)    Body mass index is 25.86 kg/(m^2).  GENERAL: vitals reviewed and listed above, alert, oriented, appears well hydrated and in no acute distress  HEENT: atraumatic, conjunttiva clear, no obvious abnormalities on inspection of external nose and ears, normal appearance of ear canals and TMs except for ear wax in L ear canal, clear nasal congestion, mild post oropharyngeal erythema with PND, no tonsillar edema or exudate, no sinus TTP  NECK: no obvious masses on inspection  LUNGS: clear to auscultation bilaterally, no wheezes, rales or rhonchi, good air movement  CV: HRRR, no peripheral edema  MS: moves all extremities without noticeable abnormality  PSYCH: pleasant and cooperative, no obvious depression or anxiety  ASSESSMENT AND PLAN:  Discussed the following assessment and plan:  Ear pressure, left  Hypothyroidism, unspecified hypothyroidism type - Plan: TSH -check TSH today and adjust as needed  Eustachian tube dysfunction, left -she is possibly allergic to steroids, so opted for short course nasal decongestant - risks and proper short term use advised  Cerumen impaction, left -removed ear wax from L ear canal with curette after explaining risks/benefits  Osteoarthritis of multiple joints, unspecified osteoarthritis type  -Patient advised to return or notify a doctor  immediately if symptoms worsen or persist or new concerns arise.  Patient Instructions  BEFORE YOU LEAVE: -labs  For the ears: AFRIN nasal spray 2 times daily for 3 days then stop       Kimberly Lanum R.

## 2015-02-07 NOTE — Patient Instructions (Signed)
BEFORE YOU LEAVE: -labs  For the ears: AFRIN nasal spray 2 times daily for 3 days then stop

## 2015-02-23 ENCOUNTER — Ambulatory Visit: Payer: Medicare Other | Admitting: Family Medicine

## 2015-03-12 ENCOUNTER — Encounter (HOSPITAL_COMMUNITY): Payer: Self-pay | Admitting: Emergency Medicine

## 2015-03-12 ENCOUNTER — Emergency Department (HOSPITAL_COMMUNITY)
Admission: EM | Admit: 2015-03-12 | Discharge: 2015-03-12 | Disposition: A | Discharge status: Home / Self Care | Payer: Medicare Other | Attending: Emergency Medicine | Admitting: Emergency Medicine

## 2015-03-12 ENCOUNTER — Emergency Department (HOSPITAL_COMMUNITY): Payer: Medicare Other

## 2015-03-12 DIAGNOSIS — S0990XA Unspecified injury of head, initial encounter: Secondary | ICD-10-CM | POA: Diagnosis not present

## 2015-03-12 DIAGNOSIS — Z880 Allergy status to penicillin: Secondary | ICD-10-CM | POA: Diagnosis not present

## 2015-03-12 DIAGNOSIS — E039 Hypothyroidism, unspecified: Secondary | ICD-10-CM | POA: Diagnosis not present

## 2015-03-12 DIAGNOSIS — Y998 Other external cause status: Secondary | ICD-10-CM | POA: Insufficient documentation

## 2015-03-12 DIAGNOSIS — S0993XA Unspecified injury of face, initial encounter: Secondary | ICD-10-CM | POA: Diagnosis present

## 2015-03-12 DIAGNOSIS — S0083XA Contusion of other part of head, initial encounter: Secondary | ICD-10-CM | POA: Insufficient documentation

## 2015-03-12 DIAGNOSIS — E785 Hyperlipidemia, unspecified: Secondary | ICD-10-CM | POA: Insufficient documentation

## 2015-03-12 DIAGNOSIS — Z8679 Personal history of other diseases of the circulatory system: Secondary | ICD-10-CM | POA: Diagnosis not present

## 2015-03-12 DIAGNOSIS — Z23 Encounter for immunization: Secondary | ICD-10-CM | POA: Insufficient documentation

## 2015-03-12 DIAGNOSIS — Z862 Personal history of diseases of the blood and blood-forming organs and certain disorders involving the immune mechanism: Secondary | ICD-10-CM | POA: Insufficient documentation

## 2015-03-12 DIAGNOSIS — Z85828 Personal history of other malignant neoplasm of skin: Secondary | ICD-10-CM | POA: Diagnosis not present

## 2015-03-12 DIAGNOSIS — W1839XA Other fall on same level, initial encounter: Secondary | ICD-10-CM | POA: Insufficient documentation

## 2015-03-12 DIAGNOSIS — S022XXB Fracture of nasal bones, initial encounter for open fracture: Secondary | ICD-10-CM | POA: Diagnosis not present

## 2015-03-12 DIAGNOSIS — S199XXA Unspecified injury of neck, initial encounter: Secondary | ICD-10-CM | POA: Diagnosis not present

## 2015-03-12 DIAGNOSIS — Y9389 Activity, other specified: Secondary | ICD-10-CM | POA: Insufficient documentation

## 2015-03-12 DIAGNOSIS — W19XXXA Unspecified fall, initial encounter: Secondary | ICD-10-CM

## 2015-03-12 DIAGNOSIS — Y9289 Other specified places as the place of occurrence of the external cause: Secondary | ICD-10-CM | POA: Diagnosis not present

## 2015-03-12 MED ORDER — ACETAMINOPHEN ER 650 MG PO TBCR: 650.0000 mg | EXTENDED_RELEASE_TABLET | ORAL | Status: DC | PRN

## 2015-03-12 MED ORDER — ACETAMINOPHEN 325 MG PO TABS
650.0000 mg | ORAL_TABLET | Freq: Once | ORAL | Status: AC
  Administered 2015-03-12: 650 mg via ORAL
  Filled 2015-03-12: qty 2

## 2015-03-12 MED ORDER — DOXYCYCLINE HYCLATE 100 MG PO CAPS: 100.0000 mg | ORAL_CAPSULE | Freq: Two times a day (BID) | ORAL | Status: DC

## 2015-03-12 MED ORDER — TETANUS-DIPHTH-ACELL PERTUSSIS 5-2.5-18.5 LF-MCG/0.5 IM SUSP
0.5000 mL | Freq: Once | INTRAMUSCULAR | Status: AC
  Administered 2015-03-12: 0.5 mL via INTRAMUSCULAR
  Filled 2015-03-12: qty 0.5

## 2015-03-12 NOTE — ED Provider Notes (Signed)
CSN: [id number]     Arrival date & time 03/12/15  1913 History   First MD Initiated Contact with Patient 03/12/15 1930     Chief Complaint  Patient presents with  . Facial Injury     (Consider location/radiation/quality/duration/timing/severity/associated sxs/prior Treatment) HPI   81 year old female with history of osteopenia presenting for evaluation of a recent fall. Patient reported approximately 6 hours ago she was walking down the driveway and her feet got tangled. she fell forward striking her face against a cemented ground. She denies any loss of consciousness. Initially thought she may have a nosebleed and she checked but bleeding was coming from the skin on the forehead. She reports pain is mild at this time. Denies any pain with eye movement, loss of vision, difficulty thinking, nausea vomiting, neck pain, pain to her extremities, all having any other symptoms. She denies any precipitating symptoms prior to the fall. She is currently not on any anticoagulants. Family member reports she is a very functional 81 year old female. She is here because of increasing bruising to her face after the fall. Patient does not recall her last tetanus shot.  Past Medical History  Diagnosis Date  . OTH MALIG NEOPLASM SKIN UPPER LIMB INCL SHOULDER 08/08/2010  . HYPOTHYROIDISM 08/30/2006  . HYPERLIPIDEMIA 01/15/2008  . ANEMIA 01/15/2008  . DEPRESSION 05/05/2007  . ANEURYSM NOS 08/30/2006  . PULMONARY NODULE, SOLITARY 02/17/2009  . OSTEOPENIA 08/30/2006  . FASTING HYPERGLYCEMIA 05/05/2007  . PULMONARY NODULE, SOLITARY - completed follow up, stable 02/17/2009    Qualifier: Diagnosis of  By: Burnice Logan  MD, Doretha Sou    Past Surgical History  Procedure Laterality Date  . Abdominal hysterectomy    . Oophorectomy     Family History  Problem Relation Age of Onset  . Diabetes Father   . Diabetes Sister   . Cancer Brother     pancreatic, prostate  . Diabetes Brother    History  Substance Use Topics  .  Smoking status: Never Smoker   . Smokeless tobacco: Not on file  . Alcohol Use: No   OB History    No data available     Review of Systems  Constitutional: Negative for fever.  HENT: Positive for facial swelling.   Eyes: Negative for pain.  Neurological: Positive for headaches.  All other systems reviewed and are negative.     Allergies  Carisoprodol; Cyclobenzaprine hcl; Penicillins; and Prednisone  Home Medications   Prior to Admission medications   Medication Sig Start Date End Date Taking? Authorizing Provider  acetaminophen (TYLENOL) 650 MG CR tablet Take 650 mg by mouth as needed.      Historical Provider, MD  fexofenadine (ALLEGRA) 180 MG tablet Take 180 mg by mouth daily.    Historical Provider, MD  levothyroxine (SYNTHROID, LEVOTHROID) 50 MCG tablet Take 1 tablet (50 mcg total) by mouth daily. 12/02/14   Lucretia Kern, DO  Magnesium-Zinc (MAGNESIUM-CHELATED ZINC PO) Take by mouth daily.    Historical Provider, MD  sodium chloride (AYR) 0.65 % nasal spray Place 1 spray into the nose as needed for congestion.    Historical Provider, MD   BP 145/81 mmHg  Pulse 97  Temp(Src) 97.8 F (36.6 C) (Oral)  Resp 15  Ht 5\' 2"  (1.575 m)  Wt 130 lb (58.968 kg)  BMI 23.77 kg/m2  SpO2 98% Physical Exam  Constitutional: She appears well-developed and well-nourished. No distress.  HENT:  Head: Normocephalic.  Small laceration noted at the bridge of nose not actively  bleeding. Bruising noted to mid forehead without crepitus. Ecchymosis noted to midface bilaterally adjacent to medial canthus of eyes. Skin abrasion noted to bridge of nose as well without foreign object and no septal deviation. No hemotympanum, no septal hematoma, no malocclusion.  Eyes: Conjunctivae and EOM are normal. Pupils are equal, round, and reactive to light.  Neck: Neck supple.  No cervical midline spine tenderness, crepitus, or step-off noted.  Musculoskeletal: She exhibits tenderness (Mild abrasion noted to  both palms and without foreign object and no significant tenderness on palpation. Small abrasion noted to left anterior knee with normal knee flexion and extension and no gross deformity.).  Neurological: She is alert.  Skin: No rash noted.  Psychiatric: She has a normal mood and affect.  Nursing note and vitals reviewed.   ED Course  Procedures (including critical care time)  Patient presents for a mechanical fall injuring her face. Suspect nasal bone fractures versus orbital fracture. CT scans ordered. T dap given.  10:18 PM Xray demonstrates R nasal bone and anterior septum fracture with nasal mucosa laceration.  NO other fx noted.  Pt does not have a septal hematoma.  Her lac is not amenable for suture repair due to delay reaction.  Pt will be prescribe doxy for open fx.  ENT referral given as needed.  Care instruction provided  Care discussed with attending.   Labs Review Labs Reviewed - No data to display  Imaging Review Ct Head Wo Contrast  03/12/2015   CLINICAL DATA:  Fall with facial injury  EXAM: CT HEAD WITHOUT CONTRAST  CT MAXILLOFACIAL WITHOUT CONTRAST  CT CERVICAL SPINE WITHOUT CONTRAST  TECHNIQUE: Multidetector CT imaging of the head, cervical spine, and maxillofacial structures were performed using the standard protocol without intravenous contrast. Multiplanar CT image reconstructions of the cervical spine and maxillofacial structures were also generated.  COMPARISON:  None.  FINDINGS: CT HEAD FINDINGS  Skull and Sinuses:Forehead and facial findings described below. No calvarial fracture .  Orbits: See below  Brain: No evidence of acute infarction, hemorrhage, hydrocephalus, or mass lesion/mass effect.  CT MAXILLOFACIAL FINDINGS  There is a nondisplaced fracture through the right nasal bone. The upper nasal septum is also fractured with presumed mucosal laceration causing soft tissue gas. The nasal septum remains midline. The mandible is intact and located. Left cataract  resection. No evidence of globe injury or postseptal hematoma. The paranasal sinuses are clear. There is a contusion to the glabella without frontal sinus or frontal bone fracture.  CT CERVICAL SPINE FINDINGS  No evidence of fracture or traumatic malalignment. There is advanced and diffuse degenerative disc disease with complete disc narrowing from C3-4 to C5-6. No gross cervical canal hematoma. No prevertebral edema. No evidence for high-grade spinal canal stenosis.  IMPRESSION: 1. Right nasal bone and anterior septum fractures with nasal mucosa laceration. 2. No evidence of intracranial or cervical spine injury.   Electronically Signed   By: Monte Fantasia M.D.   On: 03/12/2015 21:55   Ct Cervical Spine Wo Contrast  03/12/2015   CLINICAL DATA:  Fall with facial injury  EXAM: CT HEAD WITHOUT CONTRAST  CT MAXILLOFACIAL WITHOUT CONTRAST  CT CERVICAL SPINE WITHOUT CONTRAST  TECHNIQUE: Multidetector CT imaging of the head, cervical spine, and maxillofacial structures were performed using the standard protocol without intravenous contrast. Multiplanar CT image reconstructions of the cervical spine and maxillofacial structures were also generated.  COMPARISON:  None.  FINDINGS: CT HEAD FINDINGS  Skull and Sinuses:Forehead and facial findings described below. No  calvarial fracture .  Orbits: See below  Brain: No evidence of acute infarction, hemorrhage, hydrocephalus, or mass lesion/mass effect.  CT MAXILLOFACIAL FINDINGS  There is a nondisplaced fracture through the right nasal bone. The upper nasal septum is also fractured with presumed mucosal laceration causing soft tissue gas. The nasal septum remains midline. The mandible is intact and located. Left cataract resection. No evidence of globe injury or postseptal hematoma. The paranasal sinuses are clear. There is a contusion to the glabella without frontal sinus or frontal bone fracture.  CT CERVICAL SPINE FINDINGS  No evidence of fracture or traumatic  malalignment. There is advanced and diffuse degenerative disc disease with complete disc narrowing from C3-4 to C5-6. No gross cervical canal hematoma. No prevertebral edema. No evidence for high-grade spinal canal stenosis.  IMPRESSION: 1. Right nasal bone and anterior septum fractures with nasal mucosa laceration. 2. No evidence of intracranial or cervical spine injury.   Electronically Signed   By: Monte Fantasia M.D.   On: 03/12/2015 21:55   Ct Maxillofacial Wo Cm  03/12/2015   CLINICAL DATA:  Fall with facial injury  EXAM: CT HEAD WITHOUT CONTRAST  CT MAXILLOFACIAL WITHOUT CONTRAST  CT CERVICAL SPINE WITHOUT CONTRAST  TECHNIQUE: Multidetector CT imaging of the head, cervical spine, and maxillofacial structures were performed using the standard protocol without intravenous contrast. Multiplanar CT image reconstructions of the cervical spine and maxillofacial structures were also generated.  COMPARISON:  None.  FINDINGS: CT HEAD FINDINGS  Skull and Sinuses:Forehead and facial findings described below. No calvarial fracture .  Orbits: See below  Brain: No evidence of acute infarction, hemorrhage, hydrocephalus, or mass lesion/mass effect.  CT MAXILLOFACIAL FINDINGS  There is a nondisplaced fracture through the right nasal bone. The upper nasal septum is also fractured with presumed mucosal laceration causing soft tissue gas. The nasal septum remains midline. The mandible is intact and located. Left cataract resection. No evidence of globe injury or postseptal hematoma. The paranasal sinuses are clear. There is a contusion to the glabella without frontal sinus or frontal bone fracture.  CT CERVICAL SPINE FINDINGS  No evidence of fracture or traumatic malalignment. There is advanced and diffuse degenerative disc disease with complete disc narrowing from C3-4 to C5-6. No gross cervical canal hematoma. No prevertebral edema. No evidence for high-grade spinal canal stenosis.  IMPRESSION: 1. Right nasal bone and  anterior septum fractures with nasal mucosa laceration. 2. No evidence of intracranial or cervical spine injury.   Electronically Signed   By: Monte Fantasia M.D.   On: 03/12/2015 21:55     EKG Interpretation None      MDM   Final diagnoses:  Fall  Nasal bone fracture, open, initial encounter  Hematoma of face, initial encounter    BP 137/83 mmHg  Pulse 62  Temp(Src) 97.8 F (36.6 C) (Oral)  Resp 16  Ht 5\' 2"  (1.575 m)  Wt 130 lb (58.968 kg)  BMI 23.77 kg/m2  SpO2 97%  I have reviewed nursing notes and vital signs. I personally reviewed the imaging tests through PACS system  I reviewed available ER/hospitalization records thought the EMR     Domenic Moras, PA-C 03/12/15 [place], MD 03/13/15 [phone]

## 2015-03-12 NOTE — Discharge Instructions (Signed)
Nasal Fracture A nasal fracture is a break or crack in the bones of the nose. A minor break usually heals in a month. You often will receive black eyes from a nasal fracture. This is not a cause for concern. The black eyes will go away over 1 to 2 weeks.  DIAGNOSIS  Your caregiver may want to examine you if you are concerned about a fracture of the nose. X-rays of the nose may not show a nasal fracture even when one is present. Sometimes your caregiver must wait 1 to 5 days after the injury to re-check the nose for alignment and to take additional X-rays. Sometimes the caregiver must wait until the swelling has gone down. TREATMENT Minor fractures that have caused no deformity often do not require treatment. More serious fractures where bones are displaced may require surgery. This will take place after the swelling is gone. Surgery will stabilize and align the fracture. HOME CARE INSTRUCTIONS   Put ice on the injured area.  Put ice in a plastic bag.  Place a towel between your skin and the bag.  Leave the ice on for 15-20 minutes, 03-04 times a day.  Take medications as directed by your caregiver.  Only take over-the-counter or prescription medicines for pain, discomfort, or fever as directed by your caregiver.  If your nose starts bleeding, squeeze the soft parts of the nose against the center wall while you are sitting in an upright position for 10 minutes.  Contact sports should be avoided for at least 3 to 4 weeks or as directed by your caregiver. SEEK MEDICAL CARE IF:  Your pain increases or becomes severe.  You continue to have nosebleeds.  The shape of your nose does not return to normal within 5 days.  You have pus draining from the nose. SEEK IMMEDIATE MEDICAL CARE IF:   You have bleeding from your nose that does not stop after 20 minutes of pinching the nostrils closed and keeping ice on the nose.  You have clear fluid draining from your nose.  You notice a grape-like  swelling on the dividing wall between the nostrils (septum). This is a collection of blood (hematoma) that must be drained to help prevent infection.  You have difficulty moving your eyes.  You have recurrent vomiting. Document Released: 11/09/2000 Document Revised: 02/04/2012 Document Reviewed: 02/26/2011 Exit[employer] Patient Information 2015 [employer]. This information is not intended to replace advice given to you by your health care provider. Make sure you discuss any questions you have with your health care provider.  

## 2015-03-12 NOTE — ED Notes (Signed)
Pt arrived to the ED with a complaint of a fall with facial injury.  Pt has a hematoma on the right eye as well as a laceration on the nose.  Pt states she was walking down the driveway when she tangled her feet and fell.  Pt states incident happened at 1300 hrs.  Pt denies dizziness prior to fall.

## 2015-03-21 ENCOUNTER — Ambulatory Visit (INDEPENDENT_AMBULATORY_CARE_PROVIDER_SITE_OTHER): Payer: Medicare Other | Admitting: Family Medicine

## 2015-03-21 ENCOUNTER — Encounter: Payer: Self-pay | Admitting: Family Medicine

## 2015-03-21 VITALS — BP 130/70 | HR 69 | Temp 97.6°F | Ht 62.0 in | Wt 135.3 lb

## 2015-03-21 DIAGNOSIS — W19XXXA Unspecified fall, initial encounter: Secondary | ICD-10-CM

## 2015-03-21 DIAGNOSIS — S022XXA Fracture of nasal bones, initial encounter for closed fracture: Secondary | ICD-10-CM | POA: Diagnosis not present

## 2015-03-21 NOTE — Progress Notes (Signed)
HPI:  Acute visit for:  ED follow up s/p fall and nasal fx: -suffered fall in driveway 2/99/24 - tripped -evaluated in ED and had nasal fx and lac (she was put on doxy for this) - reports she is doing great, bruising and pain have resolved, had some mild dizziness for a few days when bending over -reports back pain started: a few day after the fall, she felt like she had some neck muscle pain - this has resolved now -symptoms: none now, on doxy -denies: CP, SOB, falls since, pain, HA, vision problems  ROS: See pertinent positives and negatives per HPI.  Past Medical History  Diagnosis Date  . OTH MALIG NEOPLASM SKIN UPPER LIMB INCL SHOULDER 08/08/2010  . HYPOTHYROIDISM 08/30/2006  . HYPERLIPIDEMIA 01/15/2008  . ANEMIA 01/15/2008  . DEPRESSION 05/05/2007  . ANEURYSM NOS 08/30/2006    offered imaging to assess stability, no guidelines for this, she declined  . PULMONARY NODULE, SOLITARY 02/17/2009    completed follow up, stable  . OSTEOPENIA 08/30/2006  . FASTING HYPERGLYCEMIA 05/05/2007    Past Surgical History  Procedure Laterality Date  . Abdominal hysterectomy    . Oophorectomy      Family History  Problem Relation Age of Onset  . Diabetes Father   . Diabetes Sister   . Cancer Brother     pancreatic, prostate  . Diabetes Brother     History   Social History  . Marital Status: Widowed    Spouse Name: N/A  . Number of Children: N/A  . Years of Education: N/A   Social History Main Topics  . Smoking status: Never Smoker   . Smokeless tobacco: Not on file  . Alcohol Use: No  . Drug Use: No  . Sexual Activity: Not on file   Other Topics Concern  . None   Social History Narrative   Work or School: not work      Home Situation: living with daughter      Spiritual Beliefs: none      Lifestyle: works in the yard - no regular exercise; diet is good              Current outpatient prescriptions:  .  acetaminophen (TYLENOL) 650 MG CR tablet, Take 1 tablet (650  mg total) by mouth as needed for pain., Disp: 30 tablet, Rfl: 0 .  doxycycline (MONODOX) 100 MG capsule, Take by mouth 2 (two) times daily. , Disp: , Rfl:  .  doxycycline (VIBRAMYCIN) 100 MG capsule, Take 1 capsule (100 mg total) by mouth 2 (two) times daily., Disp: 20 capsule, Rfl: 0 .  fexofenadine (ALLEGRA) 180 MG tablet, Take 180 mg by mouth daily., Disp: , Rfl:  .  levothyroxine (SYNTHROID, LEVOTHROID) 50 MCG tablet, Take 1 tablet (50 mcg total) by mouth daily., Disp: 30 tablet, Rfl: 3 .  Magnesium-Zinc (MAGNESIUM-CHELATED ZINC PO), Take 1 tablet by mouth daily. , Disp: , Rfl:  .  sodium chloride (AYR) 0.65 % nasal spray, Place 1 spray into the nose as needed for congestion., Disp: , Rfl:   EXAM:  Filed Vitals:   03/21/15 1337  BP: 130/70  Pulse: 69  Temp: 97.6 F (36.4 C)    Body mass index is 24.74 kg/(m^2).  GENERAL: vitals reviewed and listed above, alert, oriented, appears well hydrated and in no acute distress  HEENT: atraumatic, conjunttiva clear, no obvious abnormalities on inspection of external nose and ears, small laceration on nose completely healed, nose appears normal, minimal TTP on  bridge of nose.  NECK: no obvious masses on inspection  LUNGS: clear to auscultation bilaterally, no wheezes, rales or rhonchi, good air movement  CV: HRRR, no peripheral edema  MS: moves all extremities without noticeable abnormality  PSYCH: pleasant and cooperative, no obvious depression or anxiety  ASSESSMENT AND PLAN:  Discussed the following assessment and plan:  Fall, initial encounter -fall precautions -advised extra caution given recent injury, possible mild concussion, CT scans neck and head were ok  Closed fracture nasal bone, initial encounter -advised ENT, but she opted may not do anything as has healed well and this is reasonable, numbers given to daughter incase they ppt to see ENT  -Patient advised to return or notify a doctor immediately if symptoms worsen  or persist or new concerns arise.  There are no Patient Instructions on file for this visit.   Colin Benton R.

## 2015-03-21 NOTE — Progress Notes (Signed)
Pre visit review using our clinic review tool, if applicable. No additional management support is needed unless otherwise documented below in the visit note. 

## 2015-04-01 ENCOUNTER — Other Ambulatory Visit: Payer: Self-pay | Admitting: Family Medicine

## 2015-04-08 ENCOUNTER — Ambulatory Visit (INDEPENDENT_AMBULATORY_CARE_PROVIDER_SITE_OTHER): Payer: Medicare Other | Admitting: Family Medicine

## 2015-04-08 ENCOUNTER — Encounter: Payer: Self-pay | Admitting: Family Medicine

## 2015-04-08 VITALS — BP 112/80 | HR 81 | Temp 97.6°F | Ht 62.0 in | Wt 131.9 lb

## 2015-04-08 DIAGNOSIS — M25532 Pain in left wrist: Secondary | ICD-10-CM

## 2015-04-08 NOTE — Progress Notes (Signed)
HPI:  L wrist pain: -started 2 days ago, no trauma but has been doing a lot of gardening and breaking sticks in the yard -L wrist pain and some swelling -denies: weakness, numbness, trauma, fevers, pain elsewhere  ROS: See pertinent positives and negatives per HPI.  Past Medical History  Diagnosis Date  . OTH MALIG NEOPLASM SKIN UPPER LIMB INCL SHOULDER 08/08/2010  . HYPOTHYROIDISM 08/30/2006  . HYPERLIPIDEMIA 01/15/2008  . ANEMIA 01/15/2008  . DEPRESSION 05/05/2007  . ANEURYSM NOS 08/30/2006    offered imaging to assess stability, no guidelines for this, she declined  . PULMONARY NODULE, SOLITARY 02/17/2009    completed follow up, stable  . OSTEOPENIA 08/30/2006  . FASTING HYPERGLYCEMIA 05/05/2007    Past Surgical History  Procedure Laterality Date  . Abdominal hysterectomy    . Oophorectomy      Family History  Problem Relation Age of Onset  . Diabetes Father   . Diabetes Sister   . Cancer Brother     pancreatic, prostate  . Diabetes Brother     History   Social History  . Marital Status: Widowed    Spouse Name: N/A  . Number of Children: N/A  . Years of Education: N/A   Social History Main Topics  . Smoking status: Never Smoker   . Smokeless tobacco: Not on file  . Alcohol Use: No  . Drug Use: No  . Sexual Activity: Not on file   Other Topics Concern  . None   Social History Narrative   Work or School: not work      Home Situation: living with daughter      Spiritual Beliefs: none      Lifestyle: works in the yard - no regular exercise; diet is good              Current outpatient prescriptions:  .  acetaminophen (TYLENOL) 650 MG CR tablet, Take 1 tablet (650 mg total) by mouth as needed for pain., Disp: 30 tablet, Rfl: 0 .  fexofenadine (ALLEGRA) 180 MG tablet, Take 180 mg by mouth daily., Disp: , Rfl:  .  levothyroxine (SYNTHROID, LEVOTHROID) 50 MCG tablet, TAKE ONE TABLET BY MOUTH ONCE DAILY., Disp: 30 tablet, Rfl: 0 .  Magnesium-Zinc  (MAGNESIUM-CHELATED ZINC PO), Take 1 tablet by mouth daily. , Disp: , Rfl:  .  sodium chloride (AYR) 0.65 % nasal spray, Place 1 spray into the nose as needed for congestion., Disp: , Rfl:   EXAM:  Filed Vitals:   04/08/15 1600  BP: 112/80  Pulse: 81  Temp: 97.6 F (36.4 C)    Body mass index is 24.12 kg/(m^2).  GENERAL: vitals reviewed and listed above, alert, oriented, appears well hydrated and in no acute distress  HEENT: atraumatic, conjunttiva clear, no obvious abnormalities on inspection of external nose and ears  NECK: no obvious masses on inspection  MS: moves all extremities without noticeable abnormality, minimal swelling of soft tissue only on unlnar side of L wrist without redness, warmth or any signs of skin truauma; TTP in tendons of ulnar wrist, no bony TTP, normal strength and sensation in wrist, forearm ands, pain with ulnar deviation against resistance  PSYCH: pleasant and cooperative, no obvious depression or anxiety  ASSESSMENT AND PLAN:  Discussed the following assessment and plan:  Wrist pain, acute, left - Plan: DG Wrist Complete Left  -we discussed possible serious and likely etiologies, workup and treatment, treatment risks and return precautions - query wrist strain -after this discussion, Jewell opted  for plain films, NSAIDs, ice, rest and close follow up in 1 week - info for ortho urgent care provided if worsening over the weekend -of course, we advised Landon  to return or notify a doctor immediately if symptoms worsen or persist or new concerns arise.  -Patient advised to return or notify a doctor immediately if symptoms worsen or persist or new concerns arise.  Patient Instructions  BEFORE YOU LEAVE: -xray sheet  Get the xray  Please avoid any yard work for now until this is feeling better  Ice twice daily - protect skin with towel  Aleve (1 tablet) 1-2 times daily if needed for pain  Follow up in 1 week       KIM, HANNAH  R.

## 2015-04-08 NOTE — Progress Notes (Signed)
Pre visit review using our clinic review tool, if applicable. No additional management support is needed unless otherwise documented below in the visit note. 

## 2015-04-08 NOTE — Patient Instructions (Signed)
BEFORE YOU LEAVE: -xray sheet  Get the xray  Please avoid any yard work for now until this is feeling better  Ice twice daily - protect skin with towel  Aleve (1 tablet) 1-2 times daily if needed for pain  Follow up in 1 week

## 2015-04-11 ENCOUNTER — Ambulatory Visit (INDEPENDENT_AMBULATORY_CARE_PROVIDER_SITE_OTHER)
Admission: RE | Admit: 2015-04-11 | Discharge: 2015-04-11 | Disposition: A | Discharge status: Home / Self Care | Payer: Medicare Other | Source: Ambulatory Visit | Attending: Family Medicine | Admitting: Family Medicine

## 2015-04-11 DIAGNOSIS — M25532 Pain in left wrist: Secondary | ICD-10-CM | POA: Diagnosis not present

## 2015-04-11 DIAGNOSIS — M1812 Unilateral primary osteoarthritis of first carpometacarpal joint, left hand: Secondary | ICD-10-CM | POA: Diagnosis not present

## 2015-05-02 ENCOUNTER — Encounter: Payer: Self-pay | Admitting: Family Medicine

## 2015-05-02 ENCOUNTER — Ambulatory Visit (INDEPENDENT_AMBULATORY_CARE_PROVIDER_SITE_OTHER): Payer: Medicare Other | Admitting: Family Medicine

## 2015-05-02 ENCOUNTER — Other Ambulatory Visit: Payer: Self-pay | Admitting: Family Medicine

## 2015-05-02 VITALS — BP 112/70 | HR 64 | Temp 97.5°F | Ht 62.0 in | Wt 134.6 lb

## 2015-05-02 DIAGNOSIS — E039 Hypothyroidism, unspecified: Secondary | ICD-10-CM | POA: Diagnosis not present

## 2015-05-02 DIAGNOSIS — E785 Hyperlipidemia, unspecified: Secondary | ICD-10-CM | POA: Diagnosis not present

## 2015-05-02 DIAGNOSIS — M159 Polyosteoarthritis, unspecified: Secondary | ICD-10-CM

## 2015-05-02 DIAGNOSIS — J309 Allergic rhinitis, unspecified: Secondary | ICD-10-CM | POA: Diagnosis not present

## 2015-05-02 LAB — LIPID PANEL
Cholesterol: 164 mg/dL (ref 0–200)
HDL: 40.6 mg/dL (ref >39.00)
LDL Cholesterol: 99 mg/dL (ref 0–99)
NonHDL: 123.4
Total CHOL/HDL Ratio: 4
Triglycerides: 123 mg/dL (ref 0.0–149.0)
VLDL: 24.6 mg/dL (ref 0.0–40.0)

## 2015-05-02 LAB — TSH: TSH: 4.61 u[IU]/mL — ABNORMAL HIGH (ref 0.35–4.50)

## 2015-05-02 NOTE — Progress Notes (Signed)
HPI:  Hypothyroid: -TSH ok 01/2015 -denies: hot/cold intol, skin changes, palpitations  OA of knees and back: -takes some tylenol for this - once daily -no regular exercise -denies: falls recently, worsening, weakness, numbness  Difficulty Hearing: -sees audiologist  Allergic rhinitis: -uses allegra prn  HLD: -mild -denies hx statin intol -fasting today for labs  ROS: See pertinent positives and negatives per HPI.  Past Medical History  Diagnosis Date  . HYPOTHYROIDISM 08/30/2006  . HYPERLIPIDEMIA 01/15/2008  . ANEMIA 01/15/2008    resolved  . DEPRESSION 05/05/2007  . ANEURYSM NOS 08/30/2006    offered imaging to assess stability, no guidelines for this, she declined  . PULMONARY NODULE, SOLITARY 02/17/2009    completed follow up, stable  . OSTEOPENIA 08/30/2006  . FASTING HYPERGLYCEMIA 05/05/2007  . Skin cancer     Past Surgical History  Procedure Laterality Date  . Abdominal hysterectomy    . Oophorectomy      Family History  Problem Relation Age of Onset  . Diabetes Father   . Diabetes Sister   . Cancer Brother     pancreatic, prostate  . Diabetes Brother     History   Social History  . Marital Status: Widowed    Spouse Name: N/A  . Number of Children: N/A  . Years of Education: N/A   Social History Main Topics  . Smoking status: Never Smoker   . Smokeless tobacco: Not on file  . Alcohol Use: No  . Drug Use: No  . Sexual Activity: Not on file   Other Topics Concern  . None   Social History Narrative   Work or School: not work      Home Situation: living with daughter      Spiritual Beliefs: none      Lifestyle: works in the yard - no regular exercise; diet is good              Current outpatient prescriptions:  .  acetaminophen (TYLENOL) 650 MG CR tablet, Take 1 tablet (650 mg total) by mouth as needed for pain., Disp: 30 tablet, Rfl: 0 .  fexofenadine (ALLEGRA) 180 MG tablet, Take 180 mg by mouth daily., Disp: , Rfl:  .   levothyroxine (SYNTHROID, LEVOTHROID) 50 MCG tablet, TAKE ONE TABLET BY MOUTH ONCE DAILY., Disp: 30 tablet, Rfl: 0 .  Magnesium-Zinc (MAGNESIUM-CHELATED ZINC PO), Take 1 tablet by mouth daily. , Disp: , Rfl:  .  sodium chloride (AYR) 0.65 % nasal spray, Place 1 spray into the nose as needed for congestion., Disp: , Rfl:   EXAM:  Filed Vitals:   05/02/15 1007  BP: 112/70  Pulse: 64  Temp: 97.5 F (36.4 C)    Body mass index is 24.61 kg/(m^2).  GENERAL: vitals reviewed and listed above, alert, oriented, appears well hydrated and in no acute distress  HEENT: atraumatic, conjunttiva clear, no obvious abnormalities on inspection of external nose and ears  NECK: no obvious masses on inspection  LUNGS: clear to auscultation bilaterally, no wheezes, rales or rhonchi, good air movement  CV: HRRR, no peripheral edema  MS: moves all extremities without noticeable abnormality  PSYCH: pleasant and cooperative, no obvious depression or anxiety  ASSESSMENT AND PLAN:  Discussed the following assessment and plan:  Hypothyroidism, unspecified hypothyroidism type - Plan: TSH  Hyperlipemia - Plan: Lipid panel  Allergic rhinitis, unspecified allergic rhinitis type  Osteoarthritis of multiple joints, unspecified osteoarthritis type  -Patient advised to return or notify a doctor immediately if symptoms worsen or  persist or new concerns arise.  There are no Patient Instructions on file for this visit.   Colin Benton R.

## 2015-05-02 NOTE — Patient Instructions (Signed)
BEFORE YOU LEAVE: -labs -wellness visit in 6 months  We recommend the following healthy lifestyle measures: - eat a healthy diet consisting of lots of vegetables, fruits, beans, nuts, seeds, healthy meats such as white chicken and fish and whole grains.  - avoid fried foods, fast food, processed foods, sodas, red meet and other fattening foods.  - get a least 150 minutes of aerobic exercise per week.

## 2015-05-02 NOTE — Progress Notes (Signed)
Pre visit review using our clinic review tool, if applicable. No additional management support is needed unless otherwise documented below in the visit note. 

## 2015-05-04 MED ORDER — LEVOTHYROXINE SODIUM 50 MCG PO TABS: 50.0000 ug | ORAL_TABLET | Freq: Every day | ORAL | Status: DC

## 2015-05-04 NOTE — Addendum Note (Signed)
Addended by: Agnes Lawrence on: 05/04/2015 10:29 AM   Modules accepted: Orders

## 2015-10-31 ENCOUNTER — Ambulatory Visit (INDEPENDENT_AMBULATORY_CARE_PROVIDER_SITE_OTHER): Payer: Medicare Other | Admitting: Family Medicine

## 2015-10-31 ENCOUNTER — Other Ambulatory Visit: Payer: Self-pay | Admitting: Family Medicine

## 2015-10-31 DIAGNOSIS — R69 Illness, unspecified: Secondary | ICD-10-CM

## 2015-10-31 NOTE — Progress Notes (Signed)
NO SHOW

## 2015-11-02 ENCOUNTER — Ambulatory Visit (INDEPENDENT_AMBULATORY_CARE_PROVIDER_SITE_OTHER): Payer: Medicare Other | Admitting: Family Medicine

## 2015-11-02 ENCOUNTER — Encounter: Payer: Self-pay | Admitting: Family Medicine

## 2015-11-02 VITALS — BP 130/62 | HR 71 | Temp 97.4°F | Ht 59.0 in | Wt 129.2 lb

## 2015-11-02 DIAGNOSIS — Z23 Encounter for immunization: Secondary | ICD-10-CM

## 2015-11-02 DIAGNOSIS — M25572 Pain in left ankle and joints of left foot: Secondary | ICD-10-CM | POA: Diagnosis not present

## 2015-11-02 DIAGNOSIS — E039 Hypothyroidism, unspecified: Secondary | ICD-10-CM | POA: Diagnosis not present

## 2015-11-02 DIAGNOSIS — Z0000 Encounter for general adult medical examination without abnormal findings: Secondary | ICD-10-CM

## 2015-11-02 DIAGNOSIS — M159 Polyosteoarthritis, unspecified: Secondary | ICD-10-CM

## 2015-11-02 DIAGNOSIS — J309 Allergic rhinitis, unspecified: Secondary | ICD-10-CM

## 2015-11-02 DIAGNOSIS — E785 Hyperlipidemia, unspecified: Secondary | ICD-10-CM | POA: Diagnosis not present

## 2015-11-02 LAB — TSH: TSH: 5.42 u[IU]/mL — ABNORMAL HIGH (ref 0.35–4.50)

## 2015-11-02 MED ORDER — FLUTICASONE PROPIONATE 50 MCG/ACT NA SUSP: 2.0000 | Freq: Every day | NASAL | Status: DC

## 2015-11-02 NOTE — Patient Instructions (Addendum)
BEFORE YOU LEAVE: -labs -flu shot -follow up in 4-6 months  -We have ordered labs or studies at this visit. It can take up to 1-2 weeks for results and processing. We will contact you with instructions IF your results are abnormal. Normal results will be released to your [facility]. If you have not heard from [place] or can not find your results in [facility] in 2 weeks please contact our office.  We recommend the following healthy lifestyle measures: - eat a healthy whole foods diet consisting of regular small meals composed of vegetables, fruits, beans, nuts, seeds, healthy meats such as white chicken and fish and whole grains.  - avoid sweets, white starchy foods, fried foods, fast food, processed foods, sodas, red meet and other fattening foods.  - get a least 150-300 minutes of aerobic exercise per week.   Consider str[place] sock at night for foot Ok to use biofreeze Ok to use tylenol per instructions when needed  Flonase 2 sprays each nostril daily for 1 month to see if this helps the ears  Please see a lawyer and/or go to this website to help you with advanced directives and designating a health care power of attorney so that your wishes will be followed should you become unable to make your own medical decisions. [email or web address]

## 2015-11-02 NOTE — Progress Notes (Signed)
Medicare Annual Preventive Care Visit  (initial annual wellness or annual wellness exam)  Concerns and/or follow up today:  Hypothyroid: -TSH sl up last check 04/2014 -denies: hot/cold intol, skin changes, palpitations  OA of knees and back: -takes some tylenol for this - once daily -no regular exercise -denies: falls recently, worsening, weakness, numbness  Difficulty Hearing: -sees audiologist -feels frustrated with hearing aides - seeing audiologist next week -wonders if fluid or sinus issues in ears -does have allergies and uses allegra  Allergic rhinitis: -uses allegra prn  Foot pain: -started years ago -mostly L foot - plantar foot  -sharp pain with initial steps in the morning - cramping, then resolved -denies weakness, numbness, falls -if uses biofreeze before bed does not occur  HLD: -mild -denies hx statin intol  ROS: negative for report of fevers, unintentional weight loss, vision changes, vision loss, hearing loss or change, chest pain, sob, hemoptysis, melena, hematochezia, hematuria, genital discharge or lesions, falls, bleeding or bruising, loc, thoughts of suicide or self harm, memory loss  1.) Patient-completed health risk assessment  - completed and reviewed, see scanned documentation  2.) Review of Medical History: -PMH, PSH, Family History and current specialty and care providers reviewed and updated and listed below  - see scanned in document in chart and below  Past Medical History  Diagnosis Date  . HYPOTHYROIDISM 08/30/2006  . HYPERLIPIDEMIA 01/15/2008  . ANEMIA 01/15/2008    resolved  . DEPRESSION 05/05/2007  . ANEURYSM NOS 08/30/2006    offered imaging to assess stability, no guidelines for this, she declined  . PULMONARY NODULE, SOLITARY 02/17/2009    completed follow up, stable  . OSTEOPENIA 08/30/2006  . FASTING HYPERGLYCEMIA 05/05/2007  . Skin cancer   . Splenic artery aneurysm - last CT 2012 stable 08/16/2014    Past Surgical History   Procedure Laterality Date  . Abdominal hysterectomy    . Oophorectomy      Social History   Social History  . Marital Status: Widowed    Spouse Name: N/A  . Number of Children: N/A  . Years of Education: N/A   Occupational History  . Not on file.   Social History Main Topics  . Smoking status: Never Smoker   . Smokeless tobacco: Not on file  . Alcohol Use: No  . Drug Use: No  . Sexual Activity: Not on file   Other Topics Concern  . Not on file   Social History Narrative   Work or School: not work      Home Situation: living with daughter      Spiritual Beliefs: none      Lifestyle: works in the yard - no regular exercise; diet is good             Family History  Problem Relation Age of Onset  . Diabetes Father   . Diabetes Sister   . Cancer Brother     pancreatic, prostate  . Diabetes Brother     Current Outpatient Prescriptions on File Prior to Visit  Medication Sig Dispense Refill  . acetaminophen (TYLENOL) 650 MG CR tablet Take 1 tablet (650 mg total) by mouth as needed for pain. 30 tablet 0  . fexofenadine (ALLEGRA) 180 MG tablet Take 180 mg by mouth daily.    Marland Kitchen levothyroxine (SYNTHROID, LEVOTHROID) 50 MCG tablet TAKE ONE TABLET BY MOUTH ONCE DAILY 30 tablet 3  . Magnesium-Zinc (MAGNESIUM-CHELATED ZINC PO) Take 1 tablet by mouth daily.     . sodium chloride (  AYR) 0.65 % nasal spray Place 1 spray into the nose as needed for congestion.     No current facility-administered medications on file prior to visit.     3.) Review of functional ability and level of safety:  Any difficulty hearing? Sees audiologist  History of falling?  NO  Any trouble with IADLs - using a phone, using transportation, grocery shopping, preparing meals, doing housework, doing laundry, taking medications and managing money? NO - but daughter dose most chores  Advance Directives? NO  See summary of recommendations in Patient Instructions below.  4.) Physical  Exam Filed Vitals:   11/02/15 0825  BP: 130/62  Pulse: 71  Temp: 97.4 F (36.3 C)   Estimated body mass index is 26.08 kg/(m^2) as calculated from the following:   Height as of this encounter: 4\' 11"  (1.499 m).   Weight as of this encounter: 129 lb 3.2 oz (58.605 kg).  EKG (optional): deferred  General: alert, appear well hydrated and in no acute distress  HEENT: visual acuity grossly intact, minimal wax in L ear canal, TMs appear normal, clear nasal congestion and mildly boggy turbinates with PND  NECK: no masses   CV: HRRR, no peripheral edema  Lungs: CTA bilaterally  Psych: pleasant and cooperative, no obvious depression or anxiety  MS: no obvious swelling, redness or significant deformity in area of concern on L foot, minimal TTP plantar fascia L foot, normal movement of ankle/foot/toes, no bony TTP  Cognitive function grossly intact  See patient instructions for recommendations.  Education and counseling regarding the above review of health provided with a plan for the following: -see scanned patient completed form for further details -fall prevention strategies discussed  -healthy lifestyle discussed -importance and resources for completing advanced directives discussed -see patient instructions below for any other recommendations provided  4)The following written screening schedule of preventive measures were reviewed with assessment and plan made per below, orders and patient instructions:      AAA screening done if applicable     Alcohol screening done     Obesity Screening and counseling done     STI screening (Hep C if born 1943-65)declined     Tobacco Screening done done       Pneumococcal (PPSV23 -one dose after 62, one before if risk factors), influenza yearly and hepatitis B vaccines (if high risk - end stage renal disease, IV drugs, homosexual men, live in home for mentally retarded, hemophilia receiving factors) ASSESSMENT/PLAN: done if applicable       Screening mammograph (yearly if >40) ASSESSMENT/PLAN: pt and daughter decline      Screening Pap smear/pelvic exam (q2 years) ASSESSMENT/PLAN: n/a, declined      Prostate cancer screening ASSESSMENT/PLAN: n/a, declined      Colorectal cancer screening (FOBT yearly or flex sig q4y or colonoscopy q10y or barium enema q4y) ASSESSMENT/PLAN: utd       Diabetes outpatient self-management training services ASSESSMENT/PLAN: utd or done      Bone mass measurements(covered q2y if indicated - estrogen def, osteoporosis, hyperparathyroid, vertebral abnormalities, osteoporosis or steroids) ASSESSMENT/PLAN: utd or discussed and ordered per pt wishes - completed tx with boniva and opted against any further monitoring or treatment      Screening for glaucoma(q1y if high risk - diabetes, FH, AA and > 50 or hispanic and > 65) ASSESSMENT/PLAN: na      Medical nutritional therapy for individuals with diabetes or renal disease ASSESSMENT/PLAN: see orders      Cardiovascular screening blood  tests (lipids q5y) ASSESSMENT/PLAN: see orders and labs      Diabetes screening tests ASSESSMENT/PLAN: see orders and labs   7.) Summary: -risk factors and conditions per above assessment were discussed and treatment, recommendations and referrals were offered per documentation above and orders and patient instructions.  Medicare annual wellness visit, subsequent  Hypothyroidism, unspecified hypothyroidism type - Plan: TSH  Hyperlipemia  Osteoarthritis of multiple joints, unspecified osteoarthritis type  Pain in joint, ankle and foot, left  Allergic rhinitis, unspecified allergic rhinitis type  Patient Instructions  BEFORE YOU LEAVE: -labs -flu shot -follow up in 4-6 months  -We have ordered labs or studies at this visit. It can take up to 1-2 weeks for results and processing. We will contact you with instructions IF your results are abnormal. Normal results will be released to your [facility]. If  you have not heard from [place] or can not find your results in [facility] in 2 weeks please contact our office.  We recommend the following healthy lifestyle measures: - eat a healthy whole foods diet consisting of regular small meals composed of vegetables, fruits, beans, nuts, seeds, healthy meats such as white chicken and fish and whole grains.  - avoid sweets, white starchy foods, fried foods, fast food, processed foods, sodas, red meet and other fattening foods.  - get a least 150-300 minutes of aerobic exercise per week.   Consider str[place] sock at night for foot Ok to use biofreeze Ok to use tylenol per instructions when needed  Flonase 2 sprays each nostril daily for 1 month to see if this helps the ears  Please see a lawyer and/or go to this website to help you with advanced directives and designating a health care power of attorney so that your wishes will be followed should you become unable to make your own medical decisions. [email or web address]

## 2015-11-02 NOTE — Progress Notes (Signed)
Pre visit review using our clinic review tool, if applicable. No additional management support is needed unless otherwise documented below in the visit note. 

## 2015-11-07 MED ORDER — LEVOTHYROXINE SODIUM 75 MCG PO TABS: 75.0000 ug | ORAL_TABLET | Freq: Every day | ORAL | Status: DC

## 2015-11-07 NOTE — Addendum Note (Signed)
Addended by: Agnes Lawrence on: 11/07/2015 11:57 AM   Modules accepted: Orders, Medications

## 2016-01-09 ENCOUNTER — Other Ambulatory Visit (INDEPENDENT_AMBULATORY_CARE_PROVIDER_SITE_OTHER): Payer: Medicare Other

## 2016-01-09 DIAGNOSIS — E039 Hypothyroidism, unspecified: Secondary | ICD-10-CM | POA: Diagnosis not present

## 2016-01-09 LAB — TSH: TSH: 0.53 u[IU]/mL (ref 0.35–4.50)

## 2016-01-11 MED ORDER — LEVOTHYROXINE SODIUM 75 MCG PO TABS: 75.0000 ug | ORAL_TABLET | Freq: Every day | ORAL | Status: DC

## 2016-04-02 ENCOUNTER — Ambulatory Visit: Payer: Medicare Other | Admitting: Family Medicine

## 2016-04-05 ENCOUNTER — Ambulatory Visit (INDEPENDENT_AMBULATORY_CARE_PROVIDER_SITE_OTHER): Payer: Medicare Other | Admitting: Family Medicine

## 2016-04-05 ENCOUNTER — Encounter: Payer: Self-pay | Admitting: Family Medicine

## 2016-04-05 VITALS — BP 124/62 | HR 70 | Temp 97.5°F | Ht 59.0 in | Wt 131.1 lb

## 2016-04-05 DIAGNOSIS — J309 Allergic rhinitis, unspecified: Secondary | ICD-10-CM

## 2016-04-05 DIAGNOSIS — M159 Polyosteoarthritis, unspecified: Secondary | ICD-10-CM | POA: Diagnosis not present

## 2016-04-05 DIAGNOSIS — E039 Hypothyroidism, unspecified: Secondary | ICD-10-CM

## 2016-04-05 NOTE — Patient Instructions (Addendum)
Before you leave: -Schedule follow-up in about 4 months  Continue your Flonase and Allegra for the allergies. Nasal saline 2-3 times a day, particularly if you have been outside.  For the osteoarthritis, Tylenol as needed for pain [phone] mg up to 3 times per day. Tumeric may also be helpful.  We recommend the following healthy lifestyle measures: - eat a healthy whole foods diet consisting of regular small meals composed of vegetables, fruits, beans, nuts, seeds, healthy meats such as white chicken and fish and whole grains.  - avoid sweets, white starchy foods, fried foods, fast food, processed foods, sodas, red meet and other fattening foods.  - get a least 150 minutes of aerobic exercise per week.

## 2016-04-05 NOTE — Progress Notes (Signed)
Pre visit review using our clinic review tool, if applicable. No additional management support is needed unless otherwise documented below in the visit note. 

## 2016-04-05 NOTE — Progress Notes (Signed)
HPI:  Hypothyroid: -TSH sl up last check 12/2015 -on levothyroxine 6mcg -denies: hot/cold intol, skin changes, palpitations  OA of hands, knees and back: -takes some tylenol for this - once daily -The OA in her hands is worse when she does a lot of gardening -no regular exercise -denies: falls recently, worsening, weakness, numbness -walking daily about 1 mile  Difficulty Hearing: -sees audiologist  Allergic rhinitis: -uses allegra and flonase prn -goes outside a lot and is worse after being outside -nasal congesiton - clear, sneezing, itchy eyes -thick nasal congestion, shortness of breath, fevers or sinus pain   HLD: -mild, normal last check 04/2015 -denies hx statin intol  ROS: See pertinent positives and negatives per HPI.  Past Medical History  Diagnosis Date  . HYPOTHYROIDISM 08/30/2006  . HYPERLIPIDEMIA 01/15/2008  . ANEMIA 01/15/2008    resolved  . DEPRESSION 05/05/2007  . ANEURYSM NOS 08/30/2006    offered imaging to assess stability, no guidelines for this, she declined  . PULMONARY NODULE, SOLITARY 02/17/2009    completed follow up, stable  . OSTEOPENIA 08/30/2006  . FASTING HYPERGLYCEMIA 05/05/2007  . Skin cancer   . Splenic artery aneurysm - last CT 2012 stable 08/16/2014    Past Surgical History  Procedure Laterality Date  . Abdominal hysterectomy    . Oophorectomy      Family History  Problem Relation Age of Onset  . Diabetes Father   . Diabetes Sister   . Cancer Brother     pancreatic, prostate  . Diabetes Brother     Social History   Social History  . Marital Status: Widowed    Spouse Name: N/A  . Number of Children: N/A  . Years of Education: N/A   Social History Main Topics  . Smoking status: Never Smoker   . Smokeless tobacco: None  . Alcohol Use: No  . Drug Use: No  . Sexual Activity: Not Asked   Other Topics Concern  . None   Social History Narrative   Work or School: not work      Home Situation: living with daughter       Spiritual Beliefs: none      Lifestyle: works in the yard - no regular exercise; diet is good              Current outpatient prescriptions:  .  acetaminophen (TYLENOL) 650 MG CR tablet, Take 1 tablet (650 mg total) by mouth as needed for pain., Disp: 30 tablet, Rfl: 0 .  fexofenadine (ALLEGRA) 180 MG tablet, Take 180 mg by mouth daily., Disp: , Rfl:  .  fluticasone (FLONASE) 50 MCG/ACT nasal spray, Place 2 sprays into both nostrils daily., Disp: 16 g, Rfl: 0 .  levothyroxine (SYNTHROID, LEVOTHROID) 75 MCG tablet, Take 1 tablet (75 mcg total) by mouth daily., Disp: 90 tablet, Rfl: 1 .  Magnesium-Zinc (MAGNESIUM-CHELATED ZINC PO), Take 1 tablet by mouth daily. , Disp: , Rfl:  .  sodium chloride (AYR) 0.65 % nasal spray, Place 1 spray into the nose as needed for congestion., Disp: , Rfl:   EXAM:  Filed Vitals:   04/05/16 0921  BP: 124/62  Pulse: 70  Temp: 97.5 F (36.4 C)    Body mass index is 26.47 kg/(m^2).  GENERAL: vitals reviewed and listed above, alert, oriented, appears well hydrated and in no acute distress  HEENT: atraumatic, conjunttiva clear, no obvious abnormalities on inspection of external nose and ears, normal appearance of ear canals and TMs, clear nasal congestion, mild post  oropharyngeal erythema with PND, no tonsillar edema or exudate, no sinus TTP  NECK: no obvious masses on inspection  LUNGS: clear to auscultation bilaterally, no wheezes, rales or rhonchi, good air movement  CV: HRRR, no peripheral edema  MS: moves all extremities without noticeable abnormality, nodules DIPs, IP jts of hands - no redness or warmth  PSYCH: pleasant and cooperative, no obvious depression or anxiety  ASSESSMENT AND PLAN:  Discussed the following assessment and plan:  Hypothyroidism, unspecified hypothyroidism type -Continue current treatment, opted to check labs at next visit  Allergic rhinitis, unspecified allergic rhinitis type -Continue Flonase, antihistamine  and add nasal saline  Osteoarthritis of multiple joints, unspecified osteoarthritis type -Supportive measures, she may try tumeric, Tylenol as needed in safe amounts  -Patient advised to return or notify a doctor immediately if symptoms worsen or persist or new concerns arise.  Patient Instructions  Before you leave: -Schedule follow-up in about 4 months  Continue your Flonase and Allegra for the allergies. Nasal saline 2-3 times a day, particularly if you have been outside.  For the osteoarthritis, Tylenol as needed for pain [phone] mg up to 3 times per day. Tumeric may also be helpful.  We recommend the following healthy lifestyle measures: - eat a healthy whole foods diet consisting of regular small meals composed of vegetables, fruits, beans, nuts, seeds, healthy meats such as white chicken and fish and whole grains.  - avoid sweets, white starchy foods, fried foods, fast food, processed foods, sodas, red meet and other fattening foods.  - get a least 150 minutes of aerobic exercise per week.       Colin Benton R.

## 2016-04-16 ENCOUNTER — Ambulatory Visit (INDEPENDENT_AMBULATORY_CARE_PROVIDER_SITE_OTHER): Payer: Medicare Other | Admitting: Family Medicine

## 2016-04-16 ENCOUNTER — Encounter: Payer: Self-pay | Admitting: Family Medicine

## 2016-04-16 VITALS — BP 128/60 | HR 80 | Temp 97.9°F | Ht 59.0 in | Wt 132.1 lb

## 2016-04-16 DIAGNOSIS — L255 Unspecified contact dermatitis due to plants, except food: Secondary | ICD-10-CM | POA: Diagnosis not present

## 2016-04-16 MED ORDER — PREDNISONE 10 MG PO TABS: ORAL_TABLET | ORAL | Status: DC

## 2016-04-16 NOTE — Patient Instructions (Signed)
Take the prednisone as instructed. 40mg  (4 tablets) on day 1, then 30mg  (3 tablets) for 3 days, then 20mg (2 tablets) for 5 days, then 10mg  (1 tablet ) for 5 days  Be very careful when doing yard work to avoid re-exposure.  Poison [employer] ivy is a inflammation of the skin (contact dermatitis) caused by touching the allergens on the leaves of the ivy plant following previous exposure to the plant. The rash usually appears 48 hours after exposure. The rash is usually bumps (papules) or blisters (vesicles) in a linear pattern. Depending on your own sensitivity, the rash may simply cause redness and itching, or it may also progress to blisters which may break open. These must be well cared for to prevent secondary bacterial (germ) infection, followed by scarring. Keep any open areas dry, clean, dressed, and covered with an antibacterial ointment if needed. The eyes may also get puffy. The puffiness is worst in the morning and gets better as the day progresses. This dermatitis usually heals without scarring, within 2 to 3 weeks without treatment. HOME CARE INSTRUCTIONS  Thoroughly wash with soap and water as soon as you have been exposed to poison ivy. You have about one half hour to remove the plant resin before it will cause the rash. This washing will destroy the oil or antigen on the skin that is causing, or will cause, the rash. Be sure to wash under your fingernails as any plant resin there will continue to spread the rash. Do not rub skin vigorously when washing affected area. Poison ivy cannot spread if no oil from the plant remains on your body. A rash that has progressed to weeping sores will not spread the rash unless you have not washed thoroughly. It is also important to wash any clothes you have been wearing as these may carry active allergens. The rash will return if you wear the unwashed clothing, even several days later. Avoidance of the plant in the future is the best measure. Poison ivy  plant can be recognized by the number of leaves. Generally, poison ivy has three leaves with flowering branches on a single stem. Diphenhydramine may be purchased over the counter and used as needed for itching. Do not drive with this medication if it makes you drowsy.Ask your caregiver about medication for children. SEEK MEDICAL CARE IF:  Open sores develop.  Redness spreads beyond area of rash.  You notice purulent (pus-like) discharge.  You have increased pain.  Other signs of infection develop (such as fever).   This information is not intended to replace advice given to you by your health care provider. Make sure you discuss any questions you have with your health care provider.   Document Released: 11/09/2000 Document Revised: 02/04/2012 Document Reviewed: 04/20/2015 Elsevier Interactive Patient Education [employer].

## 2016-04-16 NOTE — Progress Notes (Signed)
HPI:  Itchy Rash: -has been doing a lot of work outside in [place] -started 3 days ago -arms, legs, face, neck -denies: fevers, malaise, lesions in mouth, nose or eyes, breathing difficulty -prednisone allergy listed - both she and daughter deny this allergy and she reports take flonase and has taken prednisone without issue  ROS: See pertinent positives and negatives per HPI.  Past Medical History  Diagnosis Date  . HYPOTHYROIDISM 08/30/2006  . HYPERLIPIDEMIA 01/15/2008  . ANEMIA 01/15/2008    resolved  . DEPRESSION 05/05/2007  . ANEURYSM NOS 08/30/2006    offered imaging to assess stability, no guidelines for this, she declined  . PULMONARY NODULE, SOLITARY 02/17/2009    completed follow up, stable  . OSTEOPENIA 08/30/2006  . FASTING HYPERGLYCEMIA 05/05/2007  . Skin cancer   . Splenic artery aneurysm - last CT 2012 stable 08/16/2014    Past Surgical History  Procedure Laterality Date  . Abdominal hysterectomy    . Oophorectomy      Family History  Problem Relation Age of Onset  . Diabetes Father   . Diabetes Sister   . Cancer Brother     pancreatic, prostate  . Diabetes Brother     Social History   Social History  . Marital Status: Widowed    Spouse Name: N/A  . Number of Children: N/A  . Years of Education: N/A   Social History Main Topics  . Smoking status: Never Smoker   . Smokeless tobacco: None  . Alcohol Use: No  . Drug Use: No  . Sexual Activity: Not Asked   Other Topics Concern  . None   Social History Narrative   Work or School: not work      Home Situation: living with daughter      Spiritual Beliefs: none      Lifestyle: works in the yard - no regular exercise; diet is good              Current outpatient prescriptions:  .  acetaminophen (TYLENOL) 650 MG CR tablet, Take 1 tablet (650 mg total) by mouth as needed for pain., Disp: 30 tablet, Rfl: 0 .  fexofenadine (ALLEGRA) 180 MG tablet, Take 180 mg by mouth daily., Disp: ,  Rfl:  .  fluticasone (FLONASE) 50 MCG/ACT nasal spray, Place 2 sprays into both nostrils daily., Disp: 16 g, Rfl: 0 .  levothyroxine (SYNTHROID, LEVOTHROID) 75 MCG tablet, Take 1 tablet (75 mcg total) by mouth daily., Disp: 90 tablet, Rfl: 1 .  Magnesium-Zinc (MAGNESIUM-CHELATED ZINC PO), Take 1 tablet by mouth daily. , Disp: , Rfl:  .  sodium chloride (AYR) 0.65 % nasal spray, Place 1 spray into the nose as needed for congestion., Disp: , Rfl:   EXAM:  Filed Vitals:   04/16/16 1147  BP: 128/60  Pulse: 80  Temp: 97.9 F (36.6 C)    Body mass index is 26.67 kg/(m^2).  GENERAL: vitals reviewed and listed above, alert, oriented, appears well hydrated and in no acute distress  HEENT: atraumatic, conjunttiva clear, no obvious abnormalities on inspection of external nose and ears  NECK: no obvious masses on inspection  LUNGS: clear to auscultation bilaterally, no wheezes, rales or rhonchi, good air movement  CV: HRRR, no peripheral edema  MS: moves all extremities without noticeable abnormality  SKIN: Scattered erythematous patches of papulovesicular rash on the bilateral arms, legs, neck and face; no lesions adjacent to her in the eye, on the lips or oral or nasal membranes  PSYCH: pleasant and cooperative, no obvious depression or anxiety  ASSESSMENT AND PLAN:  Discussed the following assessment and plan:  Toxicodendron dermatitis  -Discussed various treatment options and risk and opted to do slow taper of prednisone (she has prednisone allergy listed in her chart, but she and daughter both deny this and reports she takes Flonase and has taken prednisone without issues in the past) -Patient advised to return or notify a doctor immediately if symptoms worsen or persist or new concerns arise.  Patient Instructions  Take the prednisone as instructed. 40mg  (4 tablets) on day 1, then 30mg  (3 tablets) for 3 days, then 20mg (2 tablets) for 5 days, then 10mg  (1 tablet ) for 5  days  Be very careful when doing yard work to avoid re-exposure.  Poison [employer] ivy is a inflammation of the skin (contact dermatitis) caused by touching the allergens on the leaves of the ivy plant following previous exposure to the plant. The rash usually appears 48 hours after exposure. The rash is usually bumps (papules) or blisters (vesicles) in a linear pattern. Depending on your own sensitivity, the rash may simply cause redness and itching, or it may also progress to blisters which may break open. These must be well cared for to prevent secondary bacterial (germ) infection, followed by scarring. Keep any open areas dry, clean, dressed, and covered with an antibacterial ointment if needed. The eyes may also get puffy. The puffiness is worst in the morning and gets better as the day progresses. This dermatitis usually heals without scarring, within 2 to 3 weeks without treatment. HOME CARE INSTRUCTIONS  Thoroughly wash with soap and water as soon as you have been exposed to poison ivy. You have about one half hour to remove the plant resin before it will cause the rash. This washing will destroy the oil or antigen on the skin that is causing, or will cause, the rash. Be sure to wash under your fingernails as any plant resin there will continue to spread the rash. Do not rub skin vigorously when washing affected area. Poison ivy cannot spread if no oil from the plant remains on your body. A rash that has progressed to weeping sores will not spread the rash unless you have not washed thoroughly. It is also important to wash any clothes you have been wearing as these may carry active allergens. The rash will return if you wear the unwashed clothing, even several days later. Avoidance of the plant in the future is the best measure. Poison ivy plant can be recognized by the number of leaves. Generally, poison ivy has three leaves with flowering branches on a single stem. Diphenhydramine may be  purchased over the counter and used as needed for itching. Do not drive with this medication if it makes you drowsy.Ask your caregiver about medication for children. SEEK MEDICAL CARE IF:  Open sores develop.  Redness spreads beyond area of rash.  You notice purulent (pus-like) discharge.  You have increased pain.  Other signs of infection develop (such as fever).   This information is not intended to replace advice given to you by your health care provider. Make sure you discuss any questions you have with your health care provider.   Document Released: 11/09/2000 Document Revised: 02/04/2012 Document Reviewed: 04/20/2015 Elsevier Interactive Patient Education 2016 [place], [place]

## 2016-04-16 NOTE — Progress Notes (Signed)
Pre visit review using our clinic review tool, if applicable. No additional management support is needed unless otherwise documented below in the visit note. 

## 2016-08-06 ENCOUNTER — Ambulatory Visit: Payer: Medicare Other | Admitting: Family Medicine

## 2016-08-13 ENCOUNTER — Encounter: Payer: Self-pay | Admitting: Family Medicine

## 2016-08-13 ENCOUNTER — Ambulatory Visit (INDEPENDENT_AMBULATORY_CARE_PROVIDER_SITE_OTHER): Payer: Medicare Other | Admitting: Family Medicine

## 2016-08-13 VITALS — BP 120/58 | HR 86 | Temp 97.6°F | Ht 59.0 in | Wt 123.9 lb

## 2016-08-13 DIAGNOSIS — H578 Other specified disorders of eye and adnexa: Secondary | ICD-10-CM

## 2016-08-13 DIAGNOSIS — Z23 Encounter for immunization: Secondary | ICD-10-CM | POA: Diagnosis not present

## 2016-08-13 DIAGNOSIS — H5789 Other specified disorders of eye and adnexa: Secondary | ICD-10-CM

## 2016-08-13 DIAGNOSIS — E785 Hyperlipidemia, unspecified: Secondary | ICD-10-CM

## 2016-08-13 DIAGNOSIS — M159 Polyosteoarthritis, unspecified: Secondary | ICD-10-CM

## 2016-08-13 DIAGNOSIS — E039 Hypothyroidism, unspecified: Secondary | ICD-10-CM

## 2016-08-13 LAB — LIPID PANEL
CHOLESTEROL: 170 mg/dL (ref 0–200)
HDL: 43.6 mg/dL (ref >39.00)
LDL CALC: 104 mg/dL — AB (ref 0–99)
NonHDL: 126.09
TRIGLYCERIDES: 108 mg/dL (ref 0.0–149.0)
Total CHOL/HDL Ratio: 4
VLDL: 21.6 mg/dL (ref 0.0–40.0)

## 2016-08-13 LAB — TSH: TSH: 0.83 u[IU]/mL (ref 0.35–4.50)

## 2016-08-13 MED ORDER — ERYTHROMYCIN 5 MG/GM OP OINT: 1.0000 "application " | TOPICAL_OINTMENT | Freq: Every day | OPHTHALMIC | 0 refills | Status: DC

## 2016-08-13 NOTE — Patient Instructions (Addendum)
BEFORE YOU LEAVE: -flu shot -RTC exercises for the shoulder -follow up: 1) Medicare exam with [name] in December 2) follow up with [name] in 4-6 months  For the arthritis: -do the exercises for the shoulder 4 days per week -tylenol 1-2 times daily as needed for pain -heat can sometimes help -topical capsaicin products (available over the counter in various creams)  For the eye: -ointment nightly for 5 dights  We have ordered labs or studies at this visit. It can take up to 1-2 weeks for results and processing. IF results require follow up or explanation, we will call you with instructions. Clinically stable results will be released to your [facility]. If you have not heard from [place] or cannot find your results in [facility] in 2 weeks please contact our office at [phone].  If you are not yet signed up for [facility], please consider signing up.   We recommend the following healthy lifestyle for LIFE: 1) Small portions.   Tip: eat off of a salad plate instead of a dinner plate.  Tip: It is ok to feel hungry after a meal - that likely means you ate an appropriate portion.  Tip: if you need more or a snack choose fruits, veggies and/or a handful of nuts or seeds.  2) Eat a healthy clean diet.  * Tip: Avoid (less then 1 serving per week): processed foods, sweets, sweetened drinks, white starches (rice, flour, bread, potatoes, pasta, etc), red meat, fast foods, butter  *Tip: CHOOSE instead   * 5-9 servings per day of fresh or frozen fruits and vegetables (but not corn, potatoes, bananas, canned or dried fruit)   *nuts and seeds, beans   *olives and olive oil   *small portions of lean meats such as fish and white chicken    *small portions of whole grains  3)Get at least 150 minutes of sweaty aerobic exercise per week.  4)Reduce stress - consider counseling, meditation and relaxation to balance other aspects of your life.

## 2016-08-13 NOTE — Progress Notes (Signed)
HPI:  Eye drainage: -both eyes, discolored drainage in corners with odor in the morning -intermittent, occ irritated feeling -denies pain, fever, vision loss  Hypothyroid: -on levothyroxine 60mcg -denies: hot/cold intol, skin changes, palpitations  OA of hands, knees, shoulder and back: -takes some tylenol for this - once daily -The OA in her hands is worse when she does a lot of gardening -did PT in the past for the R shoulder - wants to do some exercises at home for this -denies: falls recently, worsening, weakness, numbness -walking daily about 1 mile  Difficulty Hearing: -sees audiologist  Allergic rhinitis: -uses allegra and flonase prn -goes outside a lot and is worse after being outside -nasal congesiton - clear, sneezing, itchy eyes -thick nasal congestion, shortness of breath, fevers or sinus pain  HLD: -mild, normal last check 04/2015 -denies hx statin intol  ROS: See pertinent positives and negatives per HPI.  Past Medical History:  Diagnosis Date  . ANEMIA 01/15/2008   resolved  . ANEURYSM NOS 08/30/2006   offered imaging to assess stability, no guidelines for this, she declined  . DEPRESSION 05/05/2007  . FASTING HYPERGLYCEMIA 05/05/2007  . HYPERLIPIDEMIA 01/15/2008  . HYPOTHYROIDISM 08/30/2006  . OSTEOPENIA 08/30/2006  . 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 Skin cancer   . Splenic artery aneurysm - last CT 2012 stable 08/16/2014    Past Surgical History:  Procedure Laterality Date  . ABDOMINAL HYSTERECTOMY    . OOPHORECTOMY      Family History  Problem Relation Age of Onset  . Diabetes Father   . Diabetes Sister   . Cancer Brother     pancreatic, prostate  . Diabetes Brother     Social History   Social History  . Marital status: Widowed    Spouse name: N/A  . Number of children: N/A  . Years of education: N/A   Social History Main Topics  . Smoking status: Never Smoker  . Smokeless tobacco: None  .  Alcohol use No  . Drug use: No  . Sexual activity: Not Asked   Other Topics Concern  . None   Social History Narrative   Work or School: not work      Home Situation: living with daughter      Spiritual Beliefs: none      Lifestyle: works in the yard - no regular exercise; diet is good              Current Outpatient Prescriptions:  .  acetaminophen (TYLENOL) 650 MG CR tablet, Take 1 tablet (650 mg total) by mouth as needed for pain., Disp: 30 tablet, Rfl: 0 .  fexofenadine (ALLEGRA) 180 MG tablet, Take 180 mg by mouth daily., Disp: , Rfl:  .  fluticasone (FLONASE) 50 MCG/ACT nasal spray, Place 2 sprays into both nostrils daily., Disp: 16 g, Rfl: 0 .  levothyroxine (SYNTHROID, LEVOTHROID) 75 MCG tablet, Take 1 tablet (75 mcg total) by mouth daily., Disp: 90 tablet, Rfl: 1 .  Magnesium-Zinc (MAGNESIUM-CHELATED ZINC PO), Take 1 tablet by mouth daily. , Disp: , Rfl:  .  sodium chloride (AYR) 0.65 % nasal spray, Place 1 spray into the nose as needed for congestion., Disp: , Rfl:  .  erythromycin ophthalmic ointment, Place 1 application into the left eye at bedtime. For 5 days., Disp: 3.5 g, Rfl: 0  EXAM:  Vitals:   08/13/16 0936  BP: (!) 120/58  Pulse: 86  Temp: 97.6 F (36.4 C)  Body mass index is 25.02 kg/m.  GENERAL: vitals reviewed and listed above, alert, oriented, appears well hydrated and in no acute distress  HEENT: atraumatic, conjunttiva clear, no obvious abnormalities on inspection of external nose and ears  NECK: no obvious masses on inspection  LUNGS: clear to auscultation bilaterally, no wheezes, rales or rhonchi, good air movement  CV: HRRR, no peripheral edema  MS: moves all extremities without noticeable abnormality, OA hands, suprisingly good ROM both shoulders, normal gait  PSYCH: pleasant and cooperative, no obvious depression or anxiety  ASSESSMENT AND PLAN:  Discussed the following assessment and plan:  Osteoarthritis of multiple  joints, unspecified osteoarthritis type  Eye discharge - Plan: erythromycin ophthalmic ointment  Hypothyroidism, unspecified hypothyroidism type - Plan: TSH  Hyperlipemia - Plan: Lipid Panel  -TSH, lipid ck (fasting) -flu shot -Medicare exam in December -oint for eye -HEP, top capsaicin, heat, tylenol for OA - continued activity as tolerated -follow up with [name] in 4-6 months -Patient advised to return or notify a doctor immediately if symptoms worsen or persist or new concerns arise.  Patient Instructions  BEFORE YOU LEAVE: -flu shot -RTC exercises for the shoulder -follow up: 1) Medicare exam with [name] in December 2) follow up with [name] in 4-6 months  For the arthritis: -do the exercises for the shoulder 4 days per week -tylenol 1-2 times daily as needed for pain -heat can sometimes help -topical capsaicin products (available over the counter in various creams)  For the eye: -ointment nightly for 5 dights  We have ordered labs or studies at this visit. It can take up to 1-2 weeks for results and processing. IF results require follow up or explanation, we will call you with instructions. Clinically stable results will be released to your [facility]. If you have not heard from [place] or cannot find your results in [facility] in 2 weeks please contact our office at [phone].  If you are not yet signed up for [facility], please consider signing up.   We recommend the following healthy lifestyle for LIFE: 1) Small portions.   Tip: eat off of a salad plate instead of a dinner plate.  Tip: It is ok to feel hungry after a meal - that likely means you ate an appropriate portion.  Tip: if you need more or a snack choose fruits, veggies and/or a handful of nuts or seeds.  2) Eat a healthy clean diet.  * Tip: Avoid (less then 1 serving per week): processed foods, sweets, sweetened drinks, white starches (rice, flour, bread, potatoes, pasta, etc), red meat, fast foods, butter  *Tip:  CHOOSE instead   * 5-9 servings per day of fresh or frozen fruits and vegetables (but not corn, potatoes, bananas, canned or dried fruit)   *nuts and seeds, beans   *olives and olive oil   *small portions of lean meats such as fish and white chicken    *small portions of whole grains  3)Get at least 150 minutes of sweaty aerobic exercise per week.  4)Reduce stress - consider counseling, meditation and relaxation to balance other aspects of your life.         Colin Benton R., DO

## 2016-08-13 NOTE — Progress Notes (Signed)
Pre visit review using our clinic review tool, if applicable. No additional management support is needed unless otherwise documented below in the visit note. 

## 2016-10-27 ENCOUNTER — Other Ambulatory Visit: Payer: Self-pay | Admitting: Family Medicine

## 2016-11-01 ENCOUNTER — Telehealth: Payer: Self-pay

## 2016-11-01 NOTE — Telephone Encounter (Signed)
k with me if ok with pt.

## 2016-11-01 NOTE — Telephone Encounter (Signed)
I called [facility] and informed Magda Paganini of the message below.

## 2016-11-01 NOTE — Telephone Encounter (Signed)
[facility] currently does not have Levothyroxin 75 mcgs due to the [place] in [place]. They would like to switch pt to [place]? Okay for change--please advise.

## 2016-11-01 NOTE — Telephone Encounter (Signed)
error 

## 2016-11-02 ENCOUNTER — Telehealth: Payer: Self-pay

## 2016-11-02 ENCOUNTER — Ambulatory Visit (INDEPENDENT_AMBULATORY_CARE_PROVIDER_SITE_OTHER): Payer: Medicare Other

## 2016-11-02 VITALS — BP 124/70 | HR 76 | Ht 60.5 in | Wt 123.0 lb

## 2016-11-02 DIAGNOSIS — Z0000 Encounter for general adult medical examination without abnormal findings: Secondary | ICD-10-CM

## 2016-11-02 NOTE — Telephone Encounter (Signed)
Additional note (#2)  The patient may benefit from OT to assist with AD for eating or to help with ADL. Having difficulty holding silverware or opening bottled water.  Please advise
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
## 2016-11-02 NOTE — Patient Instructions (Addendum)
Kimberly Maynard , Thank you for taking time to come for your Medicare Wellness Visit. I appreciate your ongoing commitment to your health goals. Please review the following plan we discussed and let me know if I can assist you in the future.   Will have eye exam soon  Will ask [name] about OT therapy to assist with adaptive devices as needed for activities of daily living  Will complete the advanced directive and bring in to dicsuss with [name] your wishes   These are the goals we discussed: Goals    . Exercise 150 minutes per week (moderate activity)          Try to go join a silver sneaker class next year;        This is a list of the screening recommended for you and due dates:  Health Maintenance  Topic Date Due  . Tetanus Vaccine  03/11/2025  . Flu Shot  Completed  . DEXA scan (bone density measurement)  Completed  . Shingles Vaccine  Completed    Insomnia Insomnia is a sleep disorder that makes it difficult to fall asleep or to stay asleep. Insomnia can cause tiredness (fatigue), low energy, difficulty concentrating, mood swings, and poor performance at work or school. There are three different ways to classify insomnia:  Difficulty falling asleep.  Difficulty staying asleep.  Waking up too early in the morning. Any type of insomnia can be long-term (chronic) or short-term (acute). Both are common. Short-term insomnia usually lasts for three months or less. Chronic insomnia occurs at least three times a week for longer than three months. What are the causes? Insomnia may be caused by another condition, situation, or substance, such as:  Anxiety.  Certain medicines.  Gastroesophageal reflux disease (GERD) or other gastrointestinal conditions.  Asthma or other breathing conditions.  Restless legs syndrome, sleep apnea, or other sleep disorders.  Chronic pain.  Menopause. This may include hot flashes.  Stroke.  Abuse of alcohol, tobacco, or illegal  drugs.  Depression.  Caffeine.  Neurological disorders, such as Alzheimer disease.  An overactive thyroid (hyperthyroidism). The cause of insomnia may not be known. What increases the risk? Risk factors for insomnia include:  Gender. Women are more commonly affected than men.  Age. Insomnia is more common as you get older.  Stress. This may involve your professional or personal life.  Income. Insomnia is more common in people with lower income.  Lack of exercise.  Irregular work schedule or night shifts.  Traveling between different time zones. What are the signs or symptoms? If you have insomnia, trouble falling asleep or trouble staying asleep is the main symptom. This may lead to other symptoms, such as:  Feeling fatigued.  Feeling nervous about going to sleep.  Not feeling rested in the morning.  Having trouble concentrating.  Feeling irritable, anxious, or depressed. How is this treated? Treatment for insomnia depends on the cause. If your insomnia is caused by an underlying condition, treatment will focus on addressing the condition. Treatment may also include:  Medicines to help you sleep.  Counseling or therapy.  Lifestyle adjustments. Follow these instructions at home:  Take medicines only as directed by your health care provider.  Keep regular sleeping and waking hours. Avoid naps.  Keep a sleep diary to help you and your health care provider figure out what could be causing your insomnia. Include:  When you sleep.  When you wake up during the night.  How well you sleep.  How rested you feel the next day.  Any side effects of medicines you are taking.  What you eat and drink.  Make your bedroom a comfortable place where it is easy to fall asleep:  Put up shades or special blackout curtains to block light from outside.  Use a white noise machine to block noise.  Keep the temperature cool.  Exercise regularly as directed by your health  care provider. Avoid exercising right before bedtime.  Use relaxation techniques to manage stress. Ask your health care provider to suggest some techniques that may work well for you. These may include:  Breathing exercises.  Routines to release muscle tension.  Visualizing peaceful scenes.  Cut back on alcohol, caffeinated beverages, and cigarettes, especially close to bedtime. These can disrupt your sleep.  Do not overeat or eat spicy foods right before bedtime. This can lead to digestive discomfort that can make it hard for you to sleep.  Limit screen use before bedtime. This includes:  Watching TV.  Using your smartphone, tablet, and computer.  Stick to a routine. This can help you fall asleep faster. Try to do a quiet activity, brush your teeth, and go to bed at the same time each night.  Get out of bed if you are still awake after 15 minutes of trying to sleep. Keep the lights down, but try reading or doing a quiet activity. When you feel sleepy, go back to bed.  Make sure that you drive carefully. Avoid driving if you feel very sleepy.  Keep all follow-up appointments as directed by your health care provider. This is important. Contact a health care provider if:  You are tired throughout the day or have trouble in your daily routine due to sleepiness.  You continue to have sleep problems or your sleep problems get worse. Get help right away if:  You have serious thoughts about hurting yourself or someone else. This information is not intended to replace advice given to you by your health care provider. Make sure you discuss any questions you have with your health care provider. Document Released: 11/09/2000 Document Revised: 04/13/2016 Document Reviewed: 08/13/2014 Elsevier Interactive Patient Education  2017 [employer] Prevention in the Home Introduction Falls can cause injuries. They can happen to people of all ages. There are many things you can do to make  your home safe and to help prevent falls. What can I do on the outside of my home?  Regularly fix the edges of walkways and driveways and fix any cracks.  Remove anything that might make you trip as you walk through a door, such as a raised step or threshold.  Trim any bushes or trees on the path to your home.  Use bright outdoor lighting.  Clear any walking paths of anything that might make someone trip, such as rocks or tools.  Regularly check to see if handrails are loose or broken. Make sure that both sides of any steps have handrails.  Any raised decks and porches should have guardrails on the edges.  Have any leaves, snow, or ice cleared regularly.  Use sand or salt on walking paths during winter.  Clean up any spills in your garage right away. This includes oil or grease spills. What can I do in the bathroom?  Use night lights.  Install grab bars by the toilet and in the tub and shower. Do not use towel bars as grab bars.  Use non-skid mats or decals in the tub or shower.  If you need to sit down in the shower, use a plastic, non-slip stool.  Keep the floor dry. Clean up any water that spills on the floor as soon as it happens.  Remove soap buildup in the tub or shower regularly.  Attach bath mats securely with double-sided non-slip rug tape.  Do not have throw rugs and other things on the floor that can make you trip. What can I do in the bedroom?  Use night lights.  Make sure that you have a light by your bed that is easy to reach.  Do not use any sheets or blankets that are too big for your bed. They should not hang down onto the floor.  Have a firm chair that has side arms. You can use this for support while you get dressed.  Do not have throw rugs and other things on the floor that can make you trip. What can I do in the kitchen?  Clean up any spills right away.  Avoid walking on wet floors.  Keep items that you use a lot in easy-to-reach  places.  If you need to reach something above you, use a strong step stool that has a grab bar.  Keep electrical cords out of the way.  Do not use floor polish or wax that makes floors slippery. If you must use wax, use non-skid floor wax.  Do not have throw rugs and other things on the floor that can make you trip. What can I do with my stairs?  Do not leave any items on the stairs.  Make sure that there are handrails on both sides of the stairs and use them. Fix handrails that are broken or loose. Make sure that handrails are as long as the stairways.  Check any carpeting to make sure that it is firmly attached to the stairs. Fix any carpet that is loose or worn.  Avoid having throw rugs at the top or bottom of the stairs. If you do have throw rugs, attach them to the floor with carpet tape.  Make sure that you have a light switch at the top of the stairs and the bottom of the stairs. If you do not have them, ask someone to add them for you. What else can I do to help prevent falls?  Wear shoes that:  Do not have high heels.  Have rubber bottoms.  Are comfortable and fit you well.  Are closed at the toe. Do not wear sandals.  If you use a stepladder:  Make sure that it is fully opened. Do not climb a closed stepladder.  Make sure that both sides of the stepladder are locked into place.  Ask someone to hold it for you, if possible.  Clearly mark and make sure that you can see:  Any grab bars or handrails.  First and last steps.  Where the edge of each step is.  Use tools that help you move around (mobility aids) if they are needed. These include:  Canes.  Walkers.  Scooters.  Crutches.  Turn on the lights when you go into a dark area. Replace any light bulbs as soon as they burn out.  Set up your furniture so you have a clear path. Avoid moving your furniture around.  If any of your floors are uneven, fix them.  If there are any pets around you, be  aware of where they are.  Review your medicines with your doctor. Some medicines can make you feel dizzy. This  can increase your chance of falling. Ask your doctor what other things that you can do to help prevent falls. This information is not intended to replace advice given to you by your health care provider. Make sure you discuss any questions you have with your health care provider. Document Released: 09/08/2009 Document Revised: 04/19/2016 Document Reviewed: 12/17/2014  2017 Elsevier  Health Maintenance, Female Introduction Adopting a healthy lifestyle and getting preventive care can go a long way to promote health and wellness. Talk with your health care provider about what schedule of regular examinations is right for you. This is a good chance for you to check in with your provider about disease prevention and staying healthy. In between checkups, there are plenty of things you can do on your own. Experts have done a lot of research about which lifestyle changes and preventive measures are most likely to keep you healthy. Ask your health care provider for more information. Weight and diet Eat a healthy diet  Be sure to include plenty of vegetables, fruits, low-fat dairy products, and lean protein.  Do not eat a lot of foods high in solid fats, added sugars, or salt.  Get regular exercise. This is one of the most important things you can do for your health.  Most adults should exercise for at least 150 minutes each week. The exercise should increase your heart rate and make you sweat (moderate-intensity exercise).  Most adults should also do strengthening exercises at least twice a week. This is in addition to the moderate-intensity exercise. Maintain a healthy weight  Body mass index (BMI) is a measurement that can be used to identify possible weight problems. It estimates body fat based on height and weight. Your health care provider can help determine your BMI and help you  achieve or maintain a healthy weight.  For females 46 years of age and older:  A BMI below 18.5 is considered underweight.  A BMI of 18.5 to 24.9 is normal.  A BMI of 25 to 29.9 is considered overweight.  A BMI of 30 and above is considered obese. Watch levels of cholesterol and blood lipids  You should start having your blood tested for lipids and cholesterol at 80 years of age, then have this test every 5 years.  You may need to have your cholesterol levels checked more often if:  Your lipid or cholesterol levels are high.  You are older than 80 years of age.  You are at high risk for heart disease. Cancer screening Lung Cancer  Lung cancer screening is recommended for adults 37-70 years old who are at high risk for lung cancer because of a history of smoking.  A yearly low-dose CT scan of the lungs is recommended for people who:  Currently smoke.  Have quit within the past 15 years.  Have at least a 30-pack-year history of smoking. A pack year is smoking an average of one pack of cigarettes a day for 1 year.  Yearly screening should continue until it has been 15 years since you quit.  Yearly screening should stop if you develop a health problem that would prevent you from having lung cancer treatment. Breast Cancer  Practice breast self-awareness. This means understanding how your breasts normally appear and feel.  It also means doing regular breast self-exams. Let your health care provider know about any changes, no matter how small.  If you are in your 20s or 30s, you should have a clinical breast exam (CBE) by a health  care provider every 1-3 years as part of a regular health exam.  If you are 11 or older, have a CBE every year. Also consider having a breast X-ray (mammogram) every year.  If you have a family history of breast cancer, talk to your health care provider about genetic screening.  If you are at high risk for breast cancer, talk to your health care  provider about having an MRI and a mammogram every year.  Breast cancer gene (BRCA) assessment is recommended for women who have family members with BRCA-related cancers. BRCA-related cancers include:  Breast.  Ovarian.  Tubal.  Peritoneal cancers.  Results of the assessment will determine the need for genetic counseling and BRCA1 and BRCA2 testing. Cervical Cancer  Your health care provider may recommend that you be screened regularly for cancer of the pelvic organs (ovaries, uterus, and vagina). This screening involves a pelvic examination, including checking for microscopic changes to the surface of your cervix (Pap test). You may be encouraged to have this screening done every 3 years, beginning at age 11.  For women ages 52-65, health care providers may recommend pelvic exams and Pap testing every 3 years, or they may recommend the Pap and pelvic exam, combined with testing for human papilloma virus (HPV), every 5 years. Some types of HPV increase your risk of cervical cancer. Testing for HPV may also be done on women of any age with unclear Pap test results.  Other health care providers may not recommend any screening for nonpregnant women who are considered low risk for pelvic cancer and who do not have symptoms. Ask your health care provider if a screening pelvic exam is right for you.  If you have had past treatment for cervical cancer or a condition that could lead to cancer, you need Pap tests and screening for cancer for at least 20 years after your treatment. If Pap tests have been discontinued, your risk factors (such as having a new sexual partner) need to be reassessed to determine if screening should resume. Some women have medical problems that increase the chance of getting cervical cancer. In these cases, your health care provider may recommend more frequent screening and Pap tests. Colorectal Cancer  This type of cancer can be detected and often prevented.  Routine  colorectal cancer screening usually begins at 80 years of age and continues through 80 years of age.  Your health care provider may recommend screening at an earlier age if you have risk factors for colon cancer.  Your health care provider may also recommend using home test kits to check for hidden blood in the stool.  A small camera at the end of a tube can be used to examine your colon directly (sigmoidoscopy or colonoscopy). This is done to check for the earliest forms of colorectal cancer.  Routine screening usually begins at age 52.  Direct examination of the colon should be repeated every 5-10 years through 80 years of age. However, you may need to be screened more often if early forms of precancerous polyps or small growths are found. Skin Cancer  Check your skin from head to toe regularly.  Tell your health care provider about any new moles or changes in moles, especially if there is a change in a mole's shape or color.  Also tell your health care provider if you have a mole that is larger than the size of a pencil eraser.  Always use sunscreen. Apply sunscreen liberally and repeatedly throughout the day.  Protect yourself by wearing long sleeves, pants, a wide-brimmed hat, and sunglasses whenever you are outside. Heart disease, diabetes, and high blood pressure  High blood pressure causes heart disease and increases the risk of stroke. High blood pressure is more likely to develop in:  People who have blood pressure in the high end of the normal range (130-139/85-89 mm Hg).  People who are overweight or obese.  People who are African American.  If you are 71-8 years of age, have your blood pressure checked every 3-5 years. If you are 28 years of age or older, have your blood pressure checked every year. You should have your blood pressure measured twice-once when you are at a hospital or clinic, and once when you are not at a hospital or clinic. Record the average of the two  measurements. To check your blood pressure when you are not at a hospital or clinic, you can use:  An automated blood pressure machine at a pharmacy.  A home blood pressure monitor.  If you are between 30 years and 74 years old, ask your health care provider if you should take aspirin to prevent strokes.  Have regular diabetes screenings. This involves taking a blood sample to check your fasting blood sugar level.  If you are at a normal weight and have a low risk for diabetes, have this test once every three years after 80 years of age.  If you are overweight and have a high risk for diabetes, consider being tested at a younger age or more often. Preventing infection Hepatitis B  If you have a higher risk for hepatitis B, you should be screened for this virus. You are considered at high risk for hepatitis B if:  You were born in a country where hepatitis B is common. Ask your health care provider which countries are considered high risk.  Your parents were born in a high-risk country, and you have not been immunized against hepatitis B (hepatitis B vaccine).  You have HIV or AIDS.  You use needles to inject street drugs.  You live with someone who has hepatitis B.  You have had sex with someone who has hepatitis B.  You get hemodialysis treatment.  You take certain medicines for conditions, including cancer, organ transplantation, and autoimmune conditions. Hepatitis C  Blood testing is recommended for:  Everyone born from [birth year] through 1963.  Anyone with known risk factors for hepatitis C. Sexually transmitted infections (STIs)  You should be screened for sexually transmitted infections (STIs) including gonorrhea and chlamydia if:  You are sexually active and are younger than 80 years of age.  You are older than 80 years of age and your health care provider tells you that you are at risk for this type of infection.  Your sexual activity has changed since you were last  screened and you are at an increased risk for chlamydia or gonorrhea. Ask your health care provider if you are at risk.  If you do not have HIV, but are at risk, it may be recommended that you take a prescription medicine daily to prevent HIV infection. This is called pre-exposure prophylaxis (PrEP). You are considered at risk if:  You are sexually active and do not regularly use condoms or know the HIV status of your partner(s).  You take drugs by injection.  You are sexually active with a partner who has HIV. Talk with your health care provider about whether you are at high risk of being infected with HIV.  If you choose to begin PrEP, you should first be tested for HIV. You should then be tested every 3 months for as long as you are taking PrEP. Pregnancy  If you are premenopausal and you may become pregnant, ask your health care provider about preconception counseling.  If you may become pregnant, take 400 to 800 micrograms (mcg) of folic acid every day.  If you want to prevent pregnancy, talk to your health care provider about birth control (contraception). Osteoporosis and menopause  Osteoporosis is a disease in which the bones lose minerals and strength with aging. This can result in serious bone fractures. Your risk for osteoporosis can be identified using a bone density scan.  If you are 70 years of age or older, or if you are at risk for osteoporosis and fractures, ask your health care provider if you should be screened.  Ask your health care provider whether you should take a calcium or vitamin D supplement to lower your risk for osteoporosis.  Menopause may have certain physical symptoms and risks.  Hormone replacement therapy may reduce some of these symptoms and risks. Talk to your health care provider about whether hormone replacement therapy is right for you. Follow these instructions at home:  Schedule regular health, dental, and eye exams.  Stay current with your  immunizations.  Do not use any tobacco products including cigarettes, chewing tobacco, or electronic cigarettes.  If you are pregnant, do not drink alcohol.  If you are breastfeeding, limit how much and how often you drink alcohol.  Limit alcohol intake to no more than 1 drink per day for nonpregnant women. One drink equals 12 ounces of beer, 5 ounces of wine, or 1 ounces of hard liquor.  Do not use street drugs.  Do not share needles.  Ask your health care provider for help if you need support or information about quitting drugs.  Tell your health care provider if you often feel depressed.  Tell your health care provider if you have ever been abused or do not feel safe at home. This information is not intended to replace advice given to you by your health care provider. Make sure you discuss any questions you have with your health care provider. Document Released: 05/28/2011 Document Revised: 04/19/2016 Document Reviewed: 08/16/2015  2017 [employer]     Fall Prevention in the Home Introduction Falls can cause injuries. They can happen to people of all ages. There are many things you can do to make your home safe and to help prevent falls. What can I do on the outside of my home?  Regularly fix the edges of walkways and driveways and fix any cracks.  Remove anything that might make you trip as you walk through a door, such as a raised step or threshold.  Trim any bushes or trees on the path to your home.  Use bright outdoor lighting.  Clear any walking paths of anything that might make someone trip, such as rocks or tools.  Regularly check to see if handrails are loose or broken. Make sure that both sides of any steps have handrails.  Any raised decks and porches should have guardrails on the edges.  Have any leaves, snow, or ice cleared regularly.  Use sand or salt on walking paths during winter.  Clean up any spills in your garage right away. This includes oil or  grease spills. What can I do in the bathroom?  Use night lights.  Install grab bars by the toilet and  in the tub and shower. Do not use towel bars as grab bars.  Use non-skid mats or decals in the tub or shower.  If you need to sit down in the shower, use a plastic, non-slip stool.  Keep the floor dry. Clean up any water that spills on the floor as soon as it happens.  Remove soap buildup in the tub or shower regularly.  Attach bath mats securely with double-sided non-slip rug tape.  Do not have throw rugs and other things on the floor that can make you trip. What can I do in the bedroom?  Use night lights.  Make sure that you have a light by your bed that is easy to reach.  Do not use any sheets or blankets that are too big for your bed. They should not hang down onto the floor.  Have a firm chair that has side arms. You can use this for support while you get dressed.  Do not have throw rugs and other things on the floor that can make you trip. What can I do in the kitchen?  Clean up any spills right away.  Avoid walking on wet floors.  Keep items that you use a lot in easy-to-reach places.  If you need to reach something above you, use a strong step stool that has a grab bar.  Keep electrical cords out of the way.  Do not use floor polish or wax that makes floors slippery. If you must use wax, use non-skid floor wax.  Do not have throw rugs and other things on the floor that can make you trip. What can I do with my stairs?  Do not leave any items on the stairs.  Make sure that there are handrails on both sides of the stairs and use them. Fix handrails that are broken or loose. Make sure that handrails are as long as the stairways.  Check any carpeting to make sure that it is firmly attached to the stairs. Fix any carpet that is loose or worn.  Avoid having throw rugs at the top or bottom of the stairs. If you do have throw rugs, attach them to the floor with  carpet tape.  Make sure that you have a light switch at the top of the stairs and the bottom of the stairs. If you do not have them, ask someone to add them for you. What else can I do to help prevent falls?  Wear shoes that:  Do not have high heels.  Have rubber bottoms.  Are comfortable and fit you well.  Are closed at the toe. Do not wear sandals.  If you use a stepladder:  Make sure that it is fully opened. Do not climb a closed stepladder.  Make sure that both sides of the stepladder are locked into place.  Ask someone to hold it for you, if possible.  Clearly mark and make sure that you can see:  Any grab bars or handrails.  First and last steps.  Where the edge of each step is.  Use tools that help you move around (mobility aids) if they are needed. These include:  Canes.  Walkers.  Scooters.  Crutches.  Turn on the lights when you go into a dark area. Replace any light bulbs as soon as they burn out.  Set up your furniture so you have a clear path. Avoid moving your furniture around.  If any of your floors are uneven, fix them.  If there are any  pets around you, be aware of where they are.  Review your medicines with your doctor. Some medicines can make you feel dizzy. This can increase your chance of falling. Ask your doctor what other things that you can do to help prevent falls. This information is not intended to replace advice given to you by your health care provider. Make sure you discuss any questions you have with your health care provider. Document Released: 09/08/2009 Document Revised: 04/19/2016 Document Reviewed: 12/17/2014  2017 [employer]  Health Maintenance, Female Introduction Adopting a healthy lifestyle and getting preventive care can go a long way to promote health and wellness. Talk with your health care provider about what schedule of regular examinations is right for you. This is a good chance for you to check in with your  provider about disease prevention and staying healthy. In between checkups, there are plenty of things you can do on your own. Experts have done a lot of research about which lifestyle changes and preventive measures are most likely to keep you healthy. Ask your health care provider for more information. Weight and diet Eat a healthy diet  Be sure to include plenty of vegetables, fruits, low-fat dairy products, and lean protein.  Do not eat a lot of foods high in solid fats, added sugars, or salt.  Get regular exercise. This is one of the most important things you can do for your health.  Most adults should exercise for at least 150 minutes each week. The exercise should increase your heart rate and make you sweat (moderate-intensity exercise).  Most adults should also do strengthening exercises at least twice a week. This is in addition to the moderate-intensity exercise. Maintain a healthy weight  Body mass index (BMI) is a measurement that can be used to identify possible weight problems. It estimates body fat based on height and weight. Your health care provider can help determine your BMI and help you achieve or maintain a healthy weight.  For females 34 years of age and older:  A BMI below 18.5 is considered underweight.  A BMI of 18.5 to 24.9 is normal.  A BMI of 25 to 29.9 is considered overweight.  A BMI of 30 and above is considered obese. Watch levels of cholesterol and blood lipids  You should start having your blood tested for lipids and cholesterol at 80 years of age, then have this test every 5 years.  You may need to have your cholesterol levels checked more often if:  Your lipid or cholesterol levels are high.  You are older than 80 years of age.  You are at high risk for heart disease. Cancer screening Lung Cancer  Lung cancer screening is recommended for adults 73-36 years old who are at high risk for lung cancer because of a history of smoking.  A yearly  low-dose CT scan of the lungs is recommended for people who:  Currently smoke.  Have quit within the past 15 years.  Have at least a 30-pack-year history of smoking. A pack year is smoking an average of one pack of cigarettes a day for 1 year.  Yearly screening should continue until it has been 15 years since you quit.  Yearly screening should stop if you develop a health problem that would prevent you from having lung cancer treatment. Breast Cancer  Practice breast self-awareness. This means understanding how your breasts normally appear and feel.  It also means doing regular breast self-exams. Let your health care provider know about any  changes, no matter how small.  If you are in your 20s or 30s, you should have a clinical breast exam (CBE) by a health care provider every 1-3 years as part of a regular health exam.  If you are 36 or older, have a CBE every year. Also consider having a breast X-ray (mammogram) every year.  If you have a family history of breast cancer, talk to your health care provider about genetic screening.  If you are at high risk for breast cancer, talk to your health care provider about having an MRI and a mammogram every year.  Breast cancer gene (BRCA) assessment is recommended for women who have family members with BRCA-related cancers. BRCA-related cancers include:  Breast.  Ovarian.  Tubal.  Peritoneal cancers.  Results of the assessment will determine the need for genetic counseling and BRCA1 and BRCA2 testing. Cervical Cancer  Your health care provider may recommend that you be screened regularly for cancer of the pelvic organs (ovaries, uterus, and vagina). This screening involves a pelvic examination, including checking for microscopic changes to the surface of your cervix (Pap test). You may be encouraged to have this screening done every 3 years, beginning at age 67.  For women ages 79-65, health care providers may recommend pelvic exams  and Pap testing every 3 years, or they may recommend the Pap and pelvic exam, combined with testing for human papilloma virus (HPV), every 5 years. Some types of HPV increase your risk of cervical cancer. Testing for HPV may also be done on women of any age with unclear Pap test results.  Other health care providers may not recommend any screening for nonpregnant women who are considered low risk for pelvic cancer and who do not have symptoms. Ask your health care provider if a screening pelvic exam is right for you.  If you have had past treatment for cervical cancer or a condition that could lead to cancer, you need Pap tests and screening for cancer for at least 20 years after your treatment. If Pap tests have been discontinued, your risk factors (such as having a new sexual partner) need to be reassessed to determine if screening should resume. Some women have medical problems that increase the chance of getting cervical cancer. In these cases, your health care provider may recommend more frequent screening and Pap tests. Colorectal Cancer  This type of cancer can be detected and often prevented.  Routine colorectal cancer screening usually begins at 80 years of age and continues through 80 years of age.  Your health care provider may recommend screening at an earlier age if you have risk factors for colon cancer.  Your health care provider may also recommend using home test kits to check for hidden blood in the stool.  A small camera at the end of a tube can be used to examine your colon directly (sigmoidoscopy or colonoscopy). This is done to check for the earliest forms of colorectal cancer.  Routine screening usually begins at age 60.  Direct examination of the colon should be repeated every 5-10 years through 80 years of age. However, you may need to be screened more often if early forms of precancerous polyps or small growths are found. Skin Cancer  Check your skin from head to toe  regularly.  Tell your health care provider about any new moles or changes in moles, especially if there is a change in a mole's shape or color.  Also tell your health care provider if you have  a mole that is larger than the size of a pencil eraser.  Always use sunscreen. Apply sunscreen liberally and repeatedly throughout the day.  Protect yourself by wearing long sleeves, pants, a wide-brimmed hat, and sunglasses whenever you are outside. Heart disease, diabetes, and high blood pressure  High blood pressure causes heart disease and increases the risk of stroke. High blood pressure is more likely to develop in:  People who have blood pressure in the high end of the normal range (130-139/85-89 mm Hg).  People who are overweight or obese.  People who are African American.  If you are 2-68 years of age, have your blood pressure checked every 3-5 years. If you are 85 years of age or older, have your blood pressure checked every year. You should have your blood pressure measured twice-once when you are at a hospital or clinic, and once when you are not at a hospital or clinic. Record the average of the two measurements. To check your blood pressure when you are not at a hospital or clinic, you can use:  An automated blood pressure machine at a pharmacy.  A home blood pressure monitor.  If you are between 48 years and 46 years old, ask your health care provider if you should take aspirin to prevent strokes.  Have regular diabetes screenings. This involves taking a blood sample to check your fasting blood sugar level.  If you are at a normal weight and have a low risk for diabetes, have this test once every three years after 80 years of age.  If you are overweight and have a high risk for diabetes, consider being tested at a younger age or more often. Preventing infection Hepatitis B  If you have a higher risk for hepatitis B, you should be screened for this virus. You are considered at  high risk for hepatitis B if:  You were born in a country where hepatitis B is common. Ask your health care provider which countries are considered high risk.  Your parents were born in a high-risk country, and you have not been immunized against hepatitis B (hepatitis B vaccine).  You have HIV or AIDS.  You use needles to inject street drugs.  You live with someone who has hepatitis B.  You have had sex with someone who has hepatitis B.  You get hemodialysis treatment.  You take certain medicines for conditions, including cancer, organ transplantation, and autoimmune conditions. Hepatitis C  Blood testing is recommended for:  Everyone born from [birth year] through 1963.  Anyone with known risk factors for hepatitis C. Sexually transmitted infections (STIs)  You should be screened for sexually transmitted infections (STIs) including gonorrhea and chlamydia if:  You are sexually active and are younger than 80 years of age.  You are older than 80 years of age and your health care provider tells you that you are at risk for this type of infection.  Your sexual activity has changed since you were last screened and you are at an increased risk for chlamydia or gonorrhea. Ask your health care provider if you are at risk.  If you do not have HIV, but are at risk, it may be recommended that you take a prescription medicine daily to prevent HIV infection. This is called pre-exposure prophylaxis (PrEP). You are considered at risk if:  You are sexually active and do not regularly use condoms or know the HIV status of your partner(s).  You take drugs by injection.  You are sexually active  with a partner who has HIV. Talk with your health care provider about whether you are at high risk of being infected with HIV. If you choose to begin PrEP, you should first be tested for HIV. You should then be tested every 3 months for as long as you are taking PrEP. Pregnancy  If you are premenopausal and  you may become pregnant, ask your health care provider about preconception counseling.  If you may become pregnant, take 400 to 800 micrograms (mcg) of folic acid every day.  If you want to prevent pregnancy, talk to your health care provider about birth control (contraception). Osteoporosis and menopause  Osteoporosis is a disease in which the bones lose minerals and strength with aging. This can result in serious bone fractures. Your risk for osteoporosis can be identified using a bone density scan.  If you are 56 years of age or older, or if you are at risk for osteoporosis and fractures, ask your health care provider if you should be screened.  Ask your health care provider whether you should take a calcium or vitamin D supplement to lower your risk for osteoporosis.  Menopause may have certain physical symptoms and risks.  Hormone replacement therapy may reduce some of these symptoms and risks. Talk to your health care provider about whether hormone replacement therapy is right for you. Follow these instructions at home:  Schedule regular health, dental, and eye exams.  Stay current with your immunizations.  Do not use any tobacco products including cigarettes, chewing tobacco, or electronic cigarettes.  If you are pregnant, do not drink alcohol.  If you are breastfeeding, limit how much and how often you drink alcohol.  Limit alcohol intake to no more than 1 drink per day for nonpregnant women. One drink equals 12 ounces of beer, 5 ounces of wine, or 1 ounces of hard liquor.  Do not use street drugs.  Do not share needles.  Ask your health care provider for help if you need support or information about quitting drugs.  Tell your health care provider if you often feel depressed.  Tell your health care provider if you have ever been abused or do not feel safe at home. This information is not intended to replace advice given to you by your health care provider. Make sure  you discuss any questions you have with your health care provider. Document Released: 05/28/2011 Document Revised: 04/19/2016 Document Reviewed: 08/16/2015  2017 Elsevier

## 2016-11-02 NOTE — Telephone Encounter (Signed)
Having more OA pain   Is there anything she can take otc stronger than tylenol or combination / Not sleeping well  Neg for depression  Does she need fup apt ; please advise

## 2016-11-02 NOTE — Telephone Encounter (Signed)
I would recommend a combination of Tylenol, up to 1000 mg 2-3 times a day or Aleve per instructions on bottle only 4 bad days as needed. Topical capsaicin products can sometimes also be helpful. If you feel she would benefit from OT, okay to refer if she is interested. Thanks!

## 2016-11-02 NOTE — Progress Notes (Signed)
KIM, HANNAH R., DO  

## 2016-11-20 ENCOUNTER — Telehealth: Payer: Self-pay

## 2016-11-20 NOTE — Telephone Encounter (Signed)
Call to the patient to up on Pain and OT Left VM for call back; have been unable to reach

## 2016-11-20 NOTE — Telephone Encounter (Signed)
Daughter returned your call.  Would like a call back.

## 2016-11-21 NOTE — Telephone Encounter (Signed)
Call to Kimberly Maynard to fup regarding AWV  Pain control as well as OT referral Still would like to discuss again with Kimberly Maynard, Will see [name] in Feb; but left my extension for fup if needed sooner.  OT referral on hold  Noted if pain not in control, to see [name] sooner

## 2016-12-28 DIAGNOSIS — H2521 Age-related cataract, morgagnian type, right eye: Secondary | ICD-10-CM | POA: Diagnosis not present

## 2016-12-28 DIAGNOSIS — H5000 Unspecified esotropia: Secondary | ICD-10-CM | POA: Diagnosis not present

## 2016-12-28 DIAGNOSIS — H53001 Unspecified amblyopia, right eye: Secondary | ICD-10-CM | POA: Diagnosis not present

## 2016-12-30 NOTE — Progress Notes (Signed)
HPI:  Kimberly Maynard is a pleasant 83 yo with a PMH significant for Hypothyroidism, generalized OA, hearing loss, hyperlipidemia, allergic rhinitis and osteopenia here for follow up. Doing well except has some trouble sleeping and opening jars 2ndary to arthritic pain. Afraid to take any medication for this. No weakness, numbness, radiation, wt loss, fevers.  Due for tsh check. Last AVW 10/2016  Hypothyroid: -on levothyroxine 49mcg -denies: hot/cold intol, skin changes, palpitations  OA of hands, knees, shoulder and back: -takes some tylenol for this - once daily -The OA in her hands is worse when she does a lot of gardening -did PT in the past for the R shoulder - wants to do some exercises at home for this -denies: falls recently, worsening, weakness, numbness -stays active  Difficulty Hearing: -sees audiologist  Allergic rhinitis: -uses allegra and flonase prn -goes outside a lot and is worse after being outside -nasal congesiton - clear, sneezing, itchy eyes -thick nasal congestion, shortness of breath, fevers or sinus pain  HLD: -denies hx statin intol  ROS: See pertinent positives and negatives per HPI.  Past Medical History:  Diagnosis Date  . ANEMIA 01/15/2008   resolved  . ANEURYSM NOS 08/30/2006   offered imaging to assess stability, no guidelines for this, she declined  . DEPRESSION 05/05/2007  . FASTING HYPERGLYCEMIA 05/05/2007  . HYPERLIPIDEMIA 01/15/2008  . HYPOTHYROIDISM 08/30/2006  . OSTEOPENIA 08/30/2006  . 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 Skin cancer   . Splenic artery aneurysm - last CT 2012 stable 08/16/2014    Past Surgical History:  Procedure Laterality Date  . ABDOMINAL HYSTERECTOMY    . OOPHORECTOMY      Family History  Problem Relation Age of Onset  . Diabetes Father   . Diabetes Sister   . Cancer Brother     pancreatic, prostate  . Diabetes Brother     Social History   Social History  .  Marital status: Widowed    Spouse name: N/A  . Number of children: N/A  . Years of education: N/A   Social History Main Topics  . Smoking status: Never Smoker  . Smokeless tobacco: Never Used  . Alcohol use No  . Drug use: No  . Sexual activity: Not Asked   Other Topics Concern  . None   Social History Narrative   Work or School: not work      Home Situation: living with daughter      Spiritual Beliefs: none      Lifestyle: works in the yard - no regular exercise; diet is good              Current Outpatient Prescriptions:  .  acetaminophen (TYLENOL) 650 MG CR tablet, Take 1 tablet (650 mg total) by mouth as needed for pain., Disp: 30 tablet, Rfl: 0 .  fexofenadine (ALLEGRA) 180 MG tablet, Take 180 mg by mouth daily., Disp: , Rfl:  .  fluticasone (FLONASE) 50 MCG/ACT nasal spray, Place 2 sprays into both nostrils daily., Disp: 16 g, Rfl: 0 .  levothyroxine (SYNTHROID, LEVOTHROID) 75 MCG tablet, TAKE ONE TABLET BY MOUTH ONCE DAILY, Disp: 90 tablet, Rfl: 2 .  Magnesium-Zinc (MAGNESIUM-CHELATED ZINC PO), Take 1 tablet by mouth daily. , Disp: , Rfl:  .  sodium chloride (AYR) 0.65 % nasal spray, Place 1 spray into the nose as needed for congestion., Disp: , Rfl:   EXAM:  Vitals:   12/31/16 0930  BP: 120/60  Pulse: 81  Temp: 97.4 F (36.3 C)    Body mass index is 22.7 kg/m.  GENERAL: vitals reviewed and listed above, alert, oriented, appears well hydrated and in no acute distress  HEENT: atraumatic, conjunttiva clear, no obvious abnormalities on inspection of external nose and ears  NECK: no obvious masses on inspection  LUNGS: clear to auscultation bilaterally, no wheezes, rales or rhonchi, good air movement  CV: HRRR, no peripheral edema  MS: moves all extremities without noticeable abnormality, OA hands  PSYCH: pleasant and cooperative, no obvious depression or anxiety  ASSESSMENT AND PLAN:  Discussed the following assessment and plan:  Hypothyroidism,  unspecified type - Plan: TSH  Osteoarthritis of multiple joints, unspecified osteoarthritis type  Hyperlipidemia, unspecified hyperlipidemia type  -tsh check -discussed tx options/risks for OA pain - outlined in pt instructions -follow up 3 months -Patient advised to return or notify a doctor immediately if symptoms worsen or persist or new concerns arise.  Patient Instructions  BEFORE YOU LEAVE: -follow up: 3 months -lab  For the arthritic pain: -try capsaicin sports creams when needed -heat can help -Tylenol PM 30 minutes before bed -can then also take regular tylenol 1-2 tablets once during the day as well -stay active -take Vit D3 [phone] IU daily ([employer] or [employer] brand is good)  We have ordered labs or studies at this visit. It can take up to 1-2 weeks for results and processing. IF results require follow up or explanation, we will call you with instructions. Clinically stable results will be released to your [facility]. If you have not heard from [place] or cannot find your results in [facility] in 2 weeks please contact our office at [phone].  If you are not yet signed up for [facility], please consider signing up.  If you are not yet signed up for [facility], please SIGN UP TODAY. We now offer online scheduling, same day appointments and extended hours. WHEN YOU DON'T FEEL YOUR BEST.Marland KitchenMarland KitchenWE ARE HERE TO HELP.         Colin Benton R., DO

## 2016-12-31 ENCOUNTER — Encounter: Payer: Self-pay | Admitting: Family Medicine

## 2016-12-31 ENCOUNTER — Ambulatory Visit (INDEPENDENT_AMBULATORY_CARE_PROVIDER_SITE_OTHER): Payer: Medicare Other | Admitting: Family Medicine

## 2016-12-31 VITALS — BP 120/60 | HR 81 | Temp 97.4°F | Ht 60.5 in | Wt 118.2 lb

## 2016-12-31 DIAGNOSIS — E785 Hyperlipidemia, unspecified: Secondary | ICD-10-CM

## 2016-12-31 DIAGNOSIS — M159 Polyosteoarthritis, unspecified: Secondary | ICD-10-CM | POA: Diagnosis not present

## 2016-12-31 DIAGNOSIS — E039 Hypothyroidism, unspecified: Secondary | ICD-10-CM

## 2016-12-31 LAB — TSH: TSH: 0.6 u[IU]/mL (ref 0.35–4.50)

## 2016-12-31 IMAGING — CT CT CERVICAL SPINE W/O CM
2 of 6 series · 4 of 14 positions shown, 5 images · non-contrast
Comparison: None.

CLINICAL DATA: Fall with facial injury

EXAM:
CT HEAD WITHOUT CONTRAST
CT MAXILLOFACIAL WITHOUT CONTRAST
CT CERVICAL SPINE WITHOUT CONTRAST
TECHNIQUE: Multidetector CT imaging of the head, cervical spine, and
maxillofacial structures were performed using the standard protocol
without intravenous contrast. Multiplanar CT image reconstructions
of the cervical spine and maxillofacial structures were also
generated.

[Series 11: c-spine st · axial · 0.27mm/px · z∈[-146,-88]mm · 2 of 87 slices shown]
[im 29/87  bone]
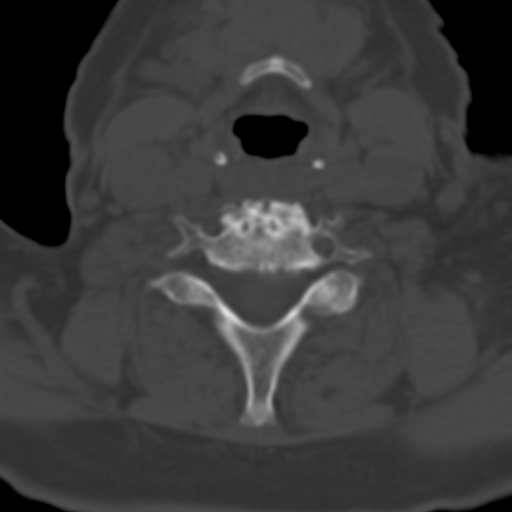
[im 58/87  bone]
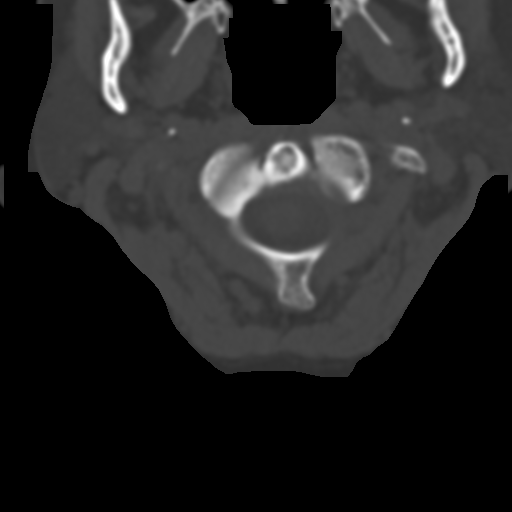

[Series 15: axial · axial · 0.23mm/px · z∈[-165,-111]mm · 2 of 89 slices shown, 3 images]
[im 30/89  soft-tissue]
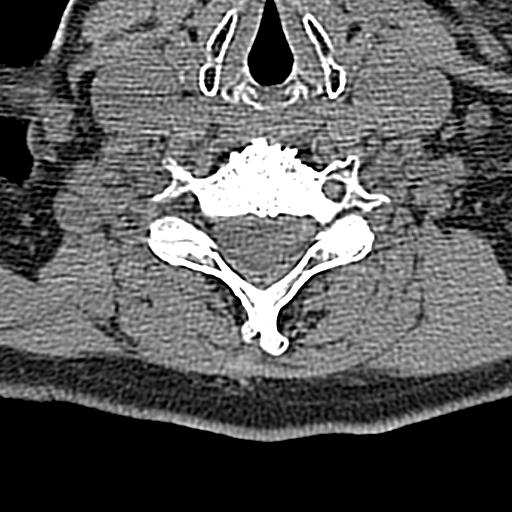
[im 30/89  bone]
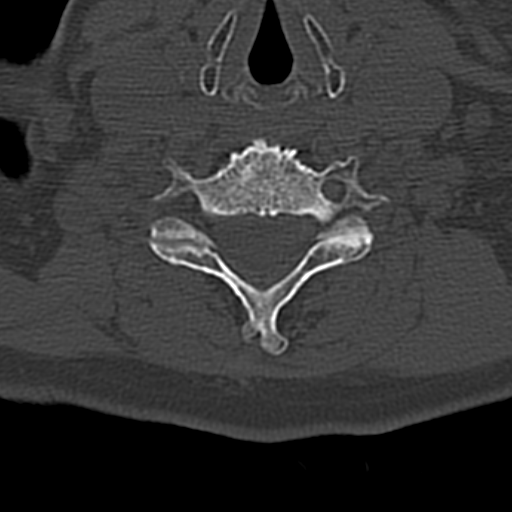
[im 59/89  bone]
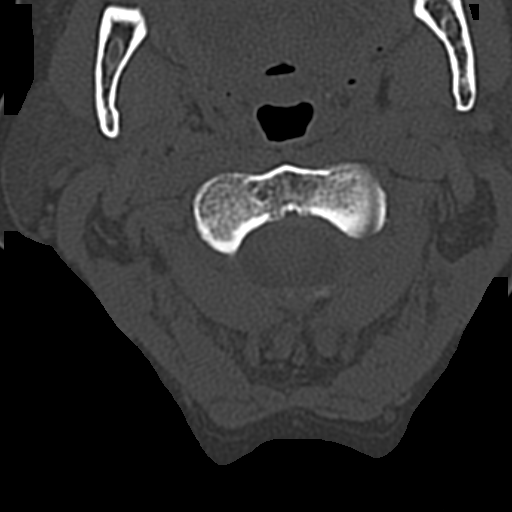

[4 of 14 positions shown; findings below may reference images not displayed]

FINDINGS: CT HEAD FINDINGS

Skull and Sinuses:Forehead and facial findings described below. No
calvarial fracture .

Orbits: See below

Brain: No evidence of acute infarction, hemorrhage, hydrocephalus,
or mass lesion/mass effect.

CT MAXILLOFACIAL FINDINGS

There is a nondisplaced fracture through the right nasal bone. The
upper nasal septum is also fractured with presumed mucosal
laceration causing soft tissue gas. The nasal septum remains
midline. The mandible is intact and located. Left cataract
resection. No evidence of globe injury or postseptal hematoma. The
paranasal sinuses are clear. There is a contusion to the glabella
without frontal sinus or frontal bone fracture.

CT CERVICAL SPINE FINDINGS

No evidence of fracture or traumatic malalignment. There is advanced
and diffuse degenerative disc disease with complete disc narrowing
from C3-4 to C5-6. No gross cervical canal hematoma. No prevertebral
edema. No evidence for high-grade spinal canal stenosis.
IMPRESSION: 1. Right nasal bone and anterior septum fractures with nasal mucosa
laceration.
2. No evidence of intracranial or cervical spine injury.

## 2016-12-31 NOTE — Progress Notes (Signed)
Pre visit review using our clinic review tool, if applicable. No additional management support is needed unless otherwise documented below in the visit note. 

## 2016-12-31 NOTE — Patient Instructions (Signed)
BEFORE YOU LEAVE: -follow up: 3 months -lab  For the arthritic pain: -try capsaicin sports creams when needed -heat can help -Tylenol PM 30 minutes before bed -can then also take regular tylenol 1-2 tablets once during the day as well -stay active -take Vit D3 [phone] IU daily ([employer] or [employer] brand is good)  We have ordered labs or studies at this visit. It can take up to 1-2 weeks for results and processing. IF results require follow up or explanation, we will call you with instructions. Clinically stable results will be released to your [facility]. If you have not heard from [place] or cannot find your results in [facility] in 2 weeks please contact our office at [phone].  If you are not yet signed up for [facility], please consider signing up.  If you are not yet signed up for [facility], please SIGN UP TODAY. We now offer online scheduling, same day appointments and extended hours. WHEN YOU DON'T FEEL YOUR BEST.Marland KitchenMarland KitchenWE ARE HERE TO HELP.

## 2016-12-31 NOTE — Progress Notes (Signed)
   Subjective:   Kimberly Maynard is a 83 y.o. female who presents for Medicare Annual (Subsequent) preventive examination.      Objective:     Vitals: BP 120/60   Pulse 81   Temp 97.4 F (36.3 C) (Oral)   Ht 5' 0.5" (1.537 m)   Wt 118 lb 3.2 oz (53.6 kg)   SpO2 97%   BMI 22.70 kg/m   Body mass index is 22.7 kg/m.  This patient was in for [facility] where she took her [facility] HCPOA home with her. She returns today to see [name] and has this form completed and notarized. A copy was made for the chart. States she is sure of her decisions; she does not want life support and does not want her dtr and son (the elected health care agent) to change this. Reviewed the form and her answers and she is firm with no questions at the present time;   A copy was made and will be filed and scanned to the chart by admin staff.     Wynetta Fines, RN  12/31/2016

## 2017-03-10 ENCOUNTER — Emergency Department (HOSPITAL_COMMUNITY)
Admission: EM | Admit: 2017-03-10 | Discharge: 2017-03-10 | Disposition: A | Discharge status: Home / Self Care | Payer: Medicare Other | Attending: Emergency Medicine | Admitting: Emergency Medicine

## 2017-03-10 ENCOUNTER — Encounter (HOSPITAL_COMMUNITY): Payer: Self-pay | Admitting: Emergency Medicine

## 2017-03-10 DIAGNOSIS — E039 Hypothyroidism, unspecified: Secondary | ICD-10-CM | POA: Insufficient documentation

## 2017-03-10 DIAGNOSIS — Z79899 Other long term (current) drug therapy: Secondary | ICD-10-CM | POA: Insufficient documentation

## 2017-03-10 DIAGNOSIS — Z85828 Personal history of other malignant neoplasm of skin: Secondary | ICD-10-CM | POA: Insufficient documentation

## 2017-03-10 DIAGNOSIS — N368 Other specified disorders of urethra: Secondary | ICD-10-CM

## 2017-03-10 DIAGNOSIS — R339 Retention of urine, unspecified: Secondary | ICD-10-CM | POA: Diagnosis present

## 2017-03-10 LAB — URINALYSIS, ROUTINE W REFLEX MICROSCOPIC
Bilirubin Urine: NEGATIVE
GLUCOSE, UA: NEGATIVE mg/dL
Ketones, ur: NEGATIVE mg/dL
NITRITE: NEGATIVE
PROTEIN: 100 mg/dL — AB
Specific Gravity, Urine: 1.016 (ref 1.005–1.030)
Squamous Epithelial / LPF: NONE SEEN
pH: 6 (ref 5.0–8.0)

## 2017-03-10 NOTE — ED Notes (Signed)
Up to the br with assistance

## 2017-03-10 NOTE — Discharge Instructions (Signed)
Follow-up with urology for further management of your bladder problems.

## 2017-03-10 NOTE — ED Triage Notes (Signed)
Pt presents to ER for dribbling urination and feeling the urge to use restroom but cannot; pt reports history of "fallen bladder"

## 2017-03-10 NOTE — ED Notes (Signed)
The pt  Is her arthritis bothering her otherwise ok

## 2017-03-10 NOTE — ED Notes (Signed)
Arthritis pain rt arm

## 2017-03-10 NOTE — ED Provider Notes (Signed)
[place] DEPT Provider Note   CSN: [id number] Arrival date & time: 03/10/17  0151  By signing my name below, I, [name], attest that this documentation has been prepared under the direction and in the presence of physician practitioner, Davonna Belling, MD. Electronically Signed: [name], Scribe. 03/10/2017. 2:39 AM.  History   Chief Complaint Chief Complaint  Patient presents with  . Urinary Retention    The history is provided by the patient. No language interpreter was used.     HPI Comments: Kimberly Maynard is a 83 y.o. female brought in by family who presents to the Emergency Department complaining of intermittent difficulty urinating beginning three days ago. She reports some associated urinary urgency and left-sided pelvic pain. Pt reports a h/o prolapsed bladder and states she has to "push it up" on a regular basis. Pt denies fevers, constipation, or any other associated symptoms.  Past Medical History:  Diagnosis Date  . ANEMIA 01/15/2008   resolved  . ANEURYSM NOS 08/30/2006   offered imaging to assess stability, no guidelines for this, she declined  . DEPRESSION 05/05/2007  . FASTING HYPERGLYCEMIA 05/05/2007  . HYPERLIPIDEMIA 01/15/2008  . HYPOTHYROIDISM 08/30/2006  . OSTEOPENIA 08/30/2006  . 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 Skin cancer   . Splenic artery aneurysm - last CT 2012 stable 08/16/2014    Patient Active Problem List   Diagnosis Date Noted  . Osteoarthritis 08/16/2014  . Hyperlipemia 01/15/2008  . Hypothyroidism 08/30/2006  . OSTEOPENIA 08/30/2006    Past Surgical History:  Procedure Laterality Date  . ABDOMINAL HYSTERECTOMY    . OOPHORECTOMY      OB History    No data available       Home Medications    Prior to Admission medications   Medication Sig Start Date End Date Taking? Authorizing Provider  acetaminophen (TYLENOL) 650 MG CR tablet Take 1 tablet (650 mg total) by mouth as needed  for pain. 03/12/15   Domenic Moras, PA-C  fexofenadine (ALLEGRA) 180 MG tablet Take 180 mg by mouth daily.    Historical Provider, MD  fluticasone (FLONASE) 50 MCG/ACT nasal spray Place 2 sprays into both nostrils daily. 11/02/15   Lucretia Kern, DO  levothyroxine (SYNTHROID, LEVOTHROID) 75 MCG tablet TAKE ONE TABLET BY MOUTH ONCE DAILY 10/29/16   Lucretia Kern, DO  Magnesium-Zinc (MAGNESIUM-CHELATED ZINC PO) Take 1 tablet by mouth daily.     Historical Provider, MD  sodium chloride (AYR) 0.65 % nasal spray Place 1 spray into the nose as needed for congestion.    Historical Provider, MD    Family History Family History  Problem Relation Age of Onset  . Diabetes Father   . Diabetes Sister   . Cancer Brother     pancreatic, prostate  . Diabetes Brother     Social History Social History  Substance Use Topics  . Smoking status: Never Smoker  . Smokeless tobacco: Never Used  . Alcohol use No     Allergies   Carisoprodol; Cyclobenzaprine hcl; Penicillins; and Prednisone   Review of Systems Review of Systems  Constitutional: Negative for fever.  Gastrointestinal: Negative for constipation.  Genitourinary: Positive for difficulty urinating, pelvic pain and urgency.   Physical Exam Updated Vital Signs BP 123/66   Pulse 77   Temp 97.5 F (36.4 C) (Oral)   Resp 16   Ht 5\' 2"  (1.575 m)   Wt 116 lb (52.6 kg)   SpO2 98%   BMI  21.22 kg/m   Physical Exam  Constitutional: She is oriented to person, place, and time. She appears well-developed and well-nourished. No distress.  Well appearing.  HENT:  Head: Normocephalic and atraumatic.  Eyes: Conjunctivae and EOM are normal.  Neck: Neck supple. No tracheal deviation present.  Cardiovascular: Normal rate.   Pulmonary/Chest: Effort normal. No respiratory distress.  Lungs are CTA.  Abdominal: She exhibits no mass. There is no tenderness. No hernia.  Midline infraumbilical scar. No hernias. No masses.  Genitourinary:  Genitourinary  Comments: Genital exam shows prolapse of tissue out of what appears periurethral meatus. No urinary retention.  Musculoskeletal: Normal range of motion. She exhibits no edema.  No peripheral edema.  Neurological: She is alert and oriented to person, place, and time.  Skin: Skin is warm and dry.  Psychiatric: She has a normal mood and affect. Her behavior is normal.  Nursing note and vitals reviewed.  ED Treatments / Results  Labs (all labs ordered are listed, but only abnormal results are displayed) Labs Reviewed  URINALYSIS, ROUTINE W REFLEX MICROSCOPIC - Abnormal; Notable for the following:       Result Value   APPearance CLOUDY (*)    Hgb urine dipstick LARGE (*)    Protein, ur 100 (*)    Leukocytes, UA LARGE (*)    Bacteria, UA RARE (*)    Non Squamous Epithelial 0-5 (*)    All other components within normal limits    EKG  EKG Interpretation None       Radiology No results found.  Procedures Procedures (including critical care time)  DIAGNOSTIC STUDIES: Oxygen Saturation is 99% on RA, normal by my interpretation.    COORDINATION OF CARE: 2:47 AM Discussed treatment plan with pt at bedside and pt agreed to plan.  Medications Ordered in ED Medications - No data to display   Initial Impression / Assessment and Plan / ED Course  I have reviewed the triage vital signs and the nursing notes.  Pertinent labs & imaging results that were available during my care of the patient were reviewed by me and considered in my medical decision making (see chart for details).     Patient with likely bladder versus ureteral prolapse. Able to urinate. No clear infection. Will have follow with her urologist.  Final Clinical Impressions(s) / ED Diagnoses   Final diagnoses:  Urethral prolapse    New Prescriptions Discharge Medication List as of 03/10/2017  4:24 AM     I personally performed the services described in this documentation, which was scribed in my presence. The  recorded information has been reviewed and is accurate.      Davonna Belling, MD 03/10/17 [phone]

## 2017-03-30 NOTE — Progress Notes (Deleted)
HPI:  Kimberly Maynard is a pleasant 83 y.o. here for follow up. Chronic medical problems summarized below were reviewed for changes and stability and were updated as needed below. These issues and their treatment remain stable for the most part. ***. Denies CP, SOB, DOE, treatment intolerance or new symptoms.   Hypothyroid: -on levothyroxine 27mcg  OA of hands, knees, shoulderand back: -takes some tylenol for this - once daily -The OA in her hands is worse when she does a lot of gardening -did PT in the past for the R shoulder   Difficulty Hearing: -sees audiologist  Allergic rhinitis: -uses allegra and flonase prn -goes outside a lot and is worse after being outside -nasal congesiton - clear, sneezing, itchy eyes  HLD: -denies hx statin intol  ROS: See pertinent positives and negatives per HPI.  Past Medical History:  Diagnosis Date  . ANEMIA 01/15/2008   resolved  . ANEURYSM NOS 08/30/2006   offered imaging to assess stability, no guidelines for this, she declined  . DEPRESSION 05/05/2007  . FASTING HYPERGLYCEMIA 05/05/2007  . HYPERLIPIDEMIA 01/15/2008  . HYPOTHYROIDISM 08/30/2006  . OSTEOPENIA 08/30/2006  . 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 Skin cancer   . Splenic artery aneurysm - last CT 2012 stable 08/16/2014    Past Surgical History:  Procedure Laterality Date  . ABDOMINAL HYSTERECTOMY    . OOPHORECTOMY      Family History  Problem Relation Age of Onset  . Diabetes Father   . Diabetes Sister   . Cancer Brother     pancreatic, prostate  . Diabetes Brother     Social History   Social History  . Marital status: Widowed    Spouse name: N/A  . Number of children: N/A  . Years of education: N/A   Social History Main Topics  . Smoking status: Never Smoker  . Smokeless tobacco: Never Used  . Alcohol use No  . Drug use: No  . Sexual activity: Not on file   Other Topics Concern  . Not on file   Social History  Narrative   Work or School: not work      Home Situation: living with daughter      Spiritual Beliefs: none      Lifestyle: works in the yard - no regular exercise; diet is good              Current Outpatient Prescriptions:  .  acetaminophen (TYLENOL) 650 MG CR tablet, Take 1 tablet (650 mg total) by mouth as needed for pain., Disp: 30 tablet, Rfl: 0 .  fexofenadine (ALLEGRA) 180 MG tablet, Take 180 mg by mouth daily., Disp: , Rfl:  .  fluticasone (FLONASE) 50 MCG/ACT nasal spray, Place 2 sprays into both nostrils daily., Disp: 16 g, Rfl: 0 .  levothyroxine (SYNTHROID, LEVOTHROID) 75 MCG tablet, TAKE ONE TABLET BY MOUTH ONCE DAILY, Disp: 90 tablet, Rfl: 2 .  Magnesium-Zinc (MAGNESIUM-CHELATED ZINC PO), Take 1 tablet by mouth daily. , Disp: , Rfl:  .  sodium chloride (AYR) 0.65 % nasal spray, Place 1 spray into the nose as needed for congestion., Disp: , Rfl:   EXAM:  There were no vitals filed for this visit.  There is no height or weight on file to calculate BMI.  GENERAL: vitals reviewed and listed above, alert, oriented, appears well hydrated and in no acute distress  HEENT: atraumatic, conjunttiva clear, no obvious abnormalities on inspection of external nose and ears  NECK:  no obvious masses on inspection  LUNGS: clear to auscultation bilaterally, no wheezes, rales or rhonchi, good air movement  CV: HRRR, no peripheral edema  MS: moves all extremities without noticeable abnormality  PSYCH: pleasant and cooperative, no obvious depression or anxiety  ASSESSMENT AND PLAN:  Discussed the following assessment and plan:  No diagnosis found.  -Patient advised to return or notify a doctor immediately if symptoms worsen or persist or new concerns arise.  There are no Patient Instructions on file for this visit.  Colin Benton R., DO

## 2017-04-01 ENCOUNTER — Ambulatory Visit: Payer: Medicare Other | Admitting: Family Medicine

## 2017-04-09 NOTE — Progress Notes (Signed)
HPI:  Kimberly Maynard is a pleasant 83 y.o. here for follow up. Chronic medical problems summarized below were reviewed for changes and stability and were updated as needed below. These issues and their treatment remain stable for the most part. She has a new concern of poison ivy rash. Has been out working in the yard. Patches on arms, hands, L face, L leg, trunk. Denies lesions on mucus membranes or eyes, tick bites, CP, SOB, DOE, treatment intolerance or new symptoms.  Hypothyroid: -on levothyroxine 60mcg  OA of hands, knees, shoulderand back: -takes some tylenol for this - once daily -The OA in her hands is worse when she does a lot of gardening -did PT in the past for the R shoulder - wants to do some exercises at home for this -stays active  Difficulty Hearing: -sees audiologist  Allergic rhinitis: -uses allegra and flonase prn -goes outside a lot and is worse after being outside -nasal congesiton - clear, sneezing, itchy eyes -thick nasal congestion, shortness of breath, fevers or sinus pain  HLD: -denies hx statin intol  Bladder prolapse: -scheduled with urologist, but may hold off and see urologist if has further issues  ROS: See pertinent positives and negatives per HPI.  Past Medical History:  Diagnosis Date  . ANEMIA 01/15/2008   resolved  . ANEURYSM NOS 08/30/2006   offered imaging to assess stability, no guidelines for this, she declined  . DEPRESSION 05/05/2007  . FASTING HYPERGLYCEMIA 05/05/2007  . HYPERLIPIDEMIA 01/15/2008  . HYPOTHYROIDISM 08/30/2006  . OSTEOPENIA 08/30/2006  . 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 Skin cancer   . Splenic artery aneurysm - last CT 2012 stable 08/16/2014    Past Surgical History:  Procedure Laterality Date  . ABDOMINAL HYSTERECTOMY    . OOPHORECTOMY      Family History  Problem Relation Age of Onset  . Diabetes Father   . Diabetes Sister   . Cancer Brother        pancreatic,  prostate  . Diabetes Brother     Social History   Social History  . Marital status: Widowed    Spouse name: N/A  . Number of children: N/A  . Years of education: N/A   Social History Main Topics  . Smoking status: Never Smoker  . Smokeless tobacco: Never Used  . Alcohol use No  . Drug use: No  . Sexual activity: Not Asked   Other Topics Concern  . None   Social History Narrative   Work or School: not work      Home Situation: living with daughter      Spiritual Beliefs: none      Lifestyle: works in the yard - no regular exercise; diet is good              Current Outpatient Prescriptions:  .  acetaminophen (TYLENOL) 650 MG CR tablet, Take 1 tablet (650 mg total) by mouth as needed for pain., Disp: 30 tablet, Rfl: 0 .  fexofenadine (ALLEGRA) 180 MG tablet, Take 180 mg by mouth daily., Disp: , Rfl:  .  fluticasone (FLONASE) 50 MCG/ACT nasal spray, Place 2 sprays into both nostrils daily., Disp: 16 g, Rfl: 0 .  levothyroxine (SYNTHROID, LEVOTHROID) 75 MCG tablet, TAKE ONE TABLET BY MOUTH ONCE DAILY, Disp: 90 tablet, Rfl: 2 .  Magnesium-Zinc (MAGNESIUM-CHELATED ZINC PO), Take 1 tablet by mouth daily. , Disp: , Rfl:  .  sodium chloride (AYR) 0.65 % nasal spray, Place 1  spray into the nose as needed for congestion., Disp: , Rfl:  .  predniSONE (DELTASONE) 10 MG tablet, 40mg  (4 tablets) on day 1, then 30mg  (3 tablets) for 3 days, then 20mg (2 tablets) for 5 days, then 10mg  (1 tablet ) for 5 days, Disp: 27 tablet, Rfl: 0  EXAM:  Vitals:   04/11/17 1016  BP: 120/62  Pulse: 68  Temp: 97.6 F (36.4 C)    Body mass index is 21.38 kg/m.  GENERAL: vitals reviewed and listed above, alert, oriented, appears well hydrated and in no acute distress  HEENT: atraumatic, conjunttiva clear, no obvious abnormalities on inspection of external nose and ears  NECK: no obvious masses on inspection  LUNGS: clear to auscultation bilaterally, no wheezes, rales or rhonchi, good air  movement  CV: HRRR, no peripheral edema  SKIN: scattered patches of erythematous papulovesicular rash on arms, L face, trunk, L leg  MS: moves all extremities without noticeable abnormality  PSYCH: pleasant and cooperative, no obvious depression or anxiety  ASSESSMENT AND PLAN:  Discussed the following assessment and plan:  Toxicodendron dermatitis -she opted for prednisone taper given widespread nature after discussion risks - worked well in the past -discussed allergy list with daughter and pt - they do not feel she has a prednisone allergy as has tolerated well in the past, will remove this from list  Hypothyroidism, unspecified type -cont medication, labs next visit  Hyperlipidemia, unspecified hyperlipidemia type -healthy lifestyle advised  Osteoarthritis of multiple joints, unspecified osteoarthritis type -stable  -Patient advised to return or notify a doctor immediately if symptoms worsen or persist or new concerns arise.  Patient Instructions  BEFORE YOU LEAVE: -follow up:  1)3-4 months with [name] 2) Medicare Exam with [name] and [name] follow up in December  Take the prednisone as prescribed for the poison ivy.   Poison Ivy Dermatitis Poison ivy dermatitis is inflammation of the skin that is caused by the allergens on the leaves of the poison ivy plant. The skin reaction often involves redness, swelling, blisters, and extreme itching. What are the causes? This condition is caused by a specific chemical (urushiol) found in the sap of the poison ivy plant. This chemical is sticky and can be easily spread to people, animals, and objects. You can get poison ivy dermatitis by:  Having direct contact with a poison ivy plant.  Touching animals, other people, or objects that have come in contact with poison ivy and have the chemical on them. What increases the risk? This condition is more likely to develop in:  People who are outdoors often.  People who go  outdoors without wearing protective clothing, such as closed shoes, long pants, and a long-sleeved shirt. What are the signs or symptoms? Symptoms of this condition include:  Redness and itching.  A rash that often includes bumps and blisters. The rash usually appears 48 hours after exposure.  Swelling. This may occur if the reaction is more severe. Symptoms usually last for 1-2 weeks. However, the first time you develop this condition, symptoms may last 3-4 weeks. How is this diagnosed? This condition may be diagnosed based on your symptoms and a physical exam. Your health care provider may also ask you about any recent outdoor activity. How is this treated? Treatment for this condition will vary depending on how severe it is. Treatment may include:  Hydrocortisone creams or calamine lotions to relieve itching.  Oatmeal baths to soothe the skin.  Over-the-counter antihistamine tablets.  Oral steroid medicine for  more severe outbreaks. Follow these instructions at home:  Take or apply over-the-counter and prescription medicines only as told by your health care provider.  Wash exposed skin as soon as possible with soap and cold water.  Use hydrocortisone creams or calamine lotion as needed to soothe the skin and relieve itching.  Take oatmeal baths as needed. Use colloidal oatmeal. You can get this at your local pharmacy or grocery store. Follow the instructions on the packaging.  Do not scratch or rub your skin.  While you have the rash, wash clothes right after you wear them. How is this prevented?  Learn to identify the poison ivy plant and avoid contact with the plant. This plant can be recognized by the number of leaves. Generally, poison ivy has three leaves with flowering branches on a single stem. The leaves are typically glossy, and they have jagged edges that come to a point at the front.  If you have been exposed to poison ivy, thoroughly wash with soap and water  right away. You have about 30 minutes to remove the plant resin before it will cause the rash. Be sure to wash under your fingernails because any plant resin there will continue to spread the rash.  When hiking or camping, wear clothes that will help you to avoid exposure on the skin. This includes long pants, a long-sleeved shirt, tall socks, and hiking boots. You can also apply preventive lotion to your skin to help limit exposure.  If you suspect that your clothes or outdoor gear came in contact with poison ivy, rinse them off outside with a garden hose before you bring them inside your house. Contact a health care provider if:  You have open sores in the rash area.  You have more redness, swelling, or pain in the affected area.  You have redness that spreads beyond the rash area.  You have fluid, blood, or pus coming from the affected area.  You have a fever.  You have a rash over a large area of your body.  You have a rash on your eyes, mouth, or genitals.  Your rash does not improve after a few days. Get help right away if:  Your face swells or your eyes swell shut.  You have trouble breathing.  You have trouble swallowing. This information is not intended to replace advice given to you by your health care provider. Make sure you discuss any questions you have with your health care provider. Document Released: 11/09/2000 Document Revised: 04/19/2016 Document Reviewed: 04/20/2015 Elsevier Interactive Patient Education  2017 [place]., DO

## 2017-04-11 ENCOUNTER — Encounter: Payer: Self-pay | Admitting: Family Medicine

## 2017-04-11 ENCOUNTER — Ambulatory Visit (INDEPENDENT_AMBULATORY_CARE_PROVIDER_SITE_OTHER): Payer: Medicare Other | Admitting: Family Medicine

## 2017-04-11 VITALS — BP 120/62 | HR 68 | Temp 97.6°F | Ht 62.0 in | Wt 116.9 lb

## 2017-04-11 DIAGNOSIS — E039 Hypothyroidism, unspecified: Secondary | ICD-10-CM | POA: Diagnosis not present

## 2017-04-11 DIAGNOSIS — M159 Polyosteoarthritis, unspecified: Secondary | ICD-10-CM

## 2017-04-11 DIAGNOSIS — L255 Unspecified contact dermatitis due to plants, except food: Secondary | ICD-10-CM

## 2017-04-11 DIAGNOSIS — E785 Hyperlipidemia, unspecified: Secondary | ICD-10-CM | POA: Diagnosis not present

## 2017-04-11 MED ORDER — PREDNISONE 10 MG PO TABS: ORAL_TABLET | ORAL | 0 refills | Status: DC

## 2017-04-11 NOTE — Patient Instructions (Signed)
BEFORE YOU LEAVE: -follow up:  1)3-4 months with [name] 2) Medicare Exam with [name] and [name] follow up in December  Take the prednisone as prescribed for the poison ivy.   Poison Ivy Dermatitis Poison ivy dermatitis is inflammation of the skin that is caused by the allergens on the leaves of the poison ivy plant. The skin reaction often involves redness, swelling, blisters, and extreme itching. What are the causes? This condition is caused by a specific chemical (urushiol) found in the sap of the poison ivy plant. This chemical is sticky and can be easily spread to people, animals, and objects. You can get poison ivy dermatitis by:  Having direct contact with a poison ivy plant.  Touching animals, other people, or objects that have come in contact with poison ivy and have the chemical on them. What increases the risk? This condition is more likely to develop in:  People who are outdoors often.  People who go outdoors without wearing protective clothing, such as closed shoes, long pants, and a long-sleeved shirt. What are the signs or symptoms? Symptoms of this condition include:  Redness and itching.  A rash that often includes bumps and blisters. The rash usually appears 48 hours after exposure.  Swelling. This may occur if the reaction is more severe. Symptoms usually last for 1-2 weeks. However, the first time you develop this condition, symptoms may last 3-4 weeks. How is this diagnosed? This condition may be diagnosed based on your symptoms and a physical exam. Your health care provider may also ask you about any recent outdoor activity. How is this treated? Treatment for this condition will vary depending on how severe it is. Treatment may include:  Hydrocortisone creams or calamine lotions to relieve itching.  Oatmeal baths to soothe the skin.  Over-the-counter antihistamine tablets.  Oral steroid medicine for more severe outbreaks. Follow these instructions at  home:  Take or apply over-the-counter and prescription medicines only as told by your health care provider.  Wash exposed skin as soon as possible with soap and cold water.  Use hydrocortisone creams or calamine lotion as needed to soothe the skin and relieve itching.  Take oatmeal baths as needed. Use colloidal oatmeal. You can get this at your local pharmacy or grocery store. Follow the instructions on the packaging.  Do not scratch or rub your skin.  While you have the rash, wash clothes right after you wear them. How is this prevented?  Learn to identify the poison ivy plant and avoid contact with the plant. This plant can be recognized by the number of leaves. Generally, poison ivy has three leaves with flowering branches on a single stem. The leaves are typically glossy, and they have jagged edges that come to a point at the front.  If you have been exposed to poison ivy, thoroughly wash with soap and water right away. You have about 30 minutes to remove the plant resin before it will cause the rash. Be sure to wash under your fingernails because any plant resin there will continue to spread the rash.  When hiking or camping, wear clothes that will help you to avoid exposure on the skin. This includes long pants, a long-sleeved shirt, tall socks, and hiking boots. You can also apply preventive lotion to your skin to help limit exposure.  If you suspect that your clothes or outdoor gear came in contact with poison ivy, rinse them off outside with a garden hose before you bring them inside your  house. Contact a health care provider if:  You have open sores in the rash area.  You have more redness, swelling, or pain in the affected area.  You have redness that spreads beyond the rash area.  You have fluid, blood, or pus coming from the affected area.  You have a fever.  You have a rash over a large area of your body.  You have a rash on your eyes, mouth, or genitals.  Your  rash does not improve after a few days. Get help right away if:  Your face swells or your eyes swell shut.  You have trouble breathing.  You have trouble swallowing. This information is not intended to replace advice given to you by your health care provider. Make sure you discuss any questions you have with your health care provider. Document Released: 11/09/2000 Document Revised: 04/19/2016 Document Reviewed: 04/20/2015 Elsevier Interactive Patient Education  2017 [employer].

## 2017-07-30 ENCOUNTER — Other Ambulatory Visit: Payer: Self-pay | Admitting: Family Medicine

## 2017-08-11 NOTE — Progress Notes (Signed)
HPI:  Kimberly Maynard is a pleasant 83 y.o. here for follow up. Chronic medical problems summarized below were reviewed for changes. Patient has new complaint of vertigo. Intermittent for the last 2 weeks. Reports brief episodes of spinning sensation with specific movements when she bends it turns her head. Denies headache, weakness, numbness, speech changes, vision changes or hearing changes. She has had more PND and allergy issues recently. Request refill on nasal spray. Denies CP, SOB, DOE, treatment intolerance or new symptoms. Due for flu shot and labs - AWV 11/02/16  Hypothyroid: -on levothyroxine 2mcg  OA of hands, knees, shoulderand back: -takes some tylenol for this - once daily -The OA in her hands is worse when she does a lot of gardening -did PT in the past for the R shoulder - wants to do some exercises at home for this -stays active  Difficulty Hearing: -sees audiologist  Allergic rhinitis: -uses allegra and flonase prn -goes outside a lot and is worse after being outside -nasal congesiton - clear, sneezing, itchy eyes -thick nasal congestion, shortness of breath, fevers or sinus pain  HLD: -denies hx statin intol  Bladder prolapse: -scheduled with urologist, but may hold off and see urologist if has further issues   ROS: See pertinent positives and negatives per HPI.  Past Medical History:  Diagnosis Date  . ANEMIA 01/15/2008   resolved  . ANEURYSM NOS 08/30/2006   offered imaging to assess stability, no guidelines for this, she declined  . DEPRESSION 05/05/2007  . FASTING HYPERGLYCEMIA 05/05/2007  . HYPERLIPIDEMIA 01/15/2008  . HYPOTHYROIDISM 08/30/2006  . OSTEOPENIA 08/30/2006  . 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 Skin cancer   . Splenic artery aneurysm - last CT 2012 stable 08/16/2014    Past Surgical History:  Procedure Laterality Date  . ABDOMINAL HYSTERECTOMY    . OOPHORECTOMY      Family History  Problem  Relation Age of Onset  . Diabetes Father   . Diabetes Sister   . Cancer Brother        pancreatic, prostate  . Diabetes Brother     Social History   Social History  . Marital status: Widowed    Spouse name: N/A  . Number of children: N/A  . Years of education: N/A   Social History Main Topics  . Smoking status: Never Smoker  . Smokeless tobacco: Never Used  . Alcohol use No  . Drug use: No  . Sexual activity: Not Asked   Other Topics Concern  . None   Social History Narrative   Work or School: not work      Home Situation: living with daughter      Spiritual Beliefs: none      Lifestyle: works in the yard - no regular exercise; diet is good              Current Outpatient Prescriptions:  .  acetaminophen (TYLENOL) 650 MG CR tablet, Take 1 tablet (650 mg total) by mouth as needed for pain., Disp: 30 tablet, Rfl: 0 .  fexofenadine (ALLEGRA) 180 MG tablet, Take 180 mg by mouth daily., Disp: , Rfl:  .  levothyroxine (SYNTHROID, LEVOTHROID) 75 MCG tablet, TAKE ONE TABLET BY MOUTH ONCE DAILY, Disp: 90 tablet, Rfl: 1 .  Magnesium-Zinc (MAGNESIUM-CHELATED ZINC PO), Take 1 tablet by mouth daily. , Disp: , Rfl:  .  predniSONE (DELTASONE) 10 MG tablet, 40mg  (4 tablets) on day 1, then 30mg  (3 tablets) for 3 days,  then 20mg (2 tablets) for 5 days, then 10mg  (1 tablet ) for 5 days, Disp: 27 tablet, Rfl: 0 .  sodium chloride (AYR) 0.65 % nasal spray, Place 1 spray into the nose as needed for congestion., Disp: , Rfl:  .  fluticasone (FLONASE) 50 MCG/ACT nasal spray, Place 2 sprays into both nostrils daily., Disp: 16 g, Rfl: 6  EXAM:  Vitals:   08/12/17 0905  BP: 124/60  Pulse: 72  Temp: (!) 97.4 F (36.3 C)  SpO2: 97%    Body mass index is 22.22 kg/m.  GENERAL: vitals reviewed and listed above, alert, oriented, appears well hydrated and in no acute distress  HEENT: atraumatic, conjunttiva clear, no obvious abnormalities on inspection of external nose and ears, normal  appearance of ear canals and TMs, clear nasal congestion, mild post oropharyngeal erythema with PND, no tonsillar edema or exudate, no sinus TTP  NECK: no obvious masses on inspection  LUNGS: clear to auscultation bilaterally, no wheezes, rales or rhonchi, good air movement  CV: HRRR, no peripheral edema  MS: moves all extremities without noticeable abnormality  PSYCH/NEURO: pleasant and cooperative, no obvious depression or anxiety, speech and thought processing grossly intact, gait cautious, but normal, cranial nerves II through XII grossly intact, finger to nose normal, Kimberly Maynard is positive to the right with rotational nystagmus and reproduction of symptoms that resolved with rest and upright straight position  ASSESSMENT AND PLAN:  Discussed the following assessment and plan:  Vertigo - Plan: Ambulatory referral to Physical Therapy -we discussed possible serious and likely etiologies, workup and treatment, treatment risks and return precautions - positional vertigo most likely given symptoms and exam findings -after this discussion, Kimberly Maynard opted for referral for vestibular therapy, fall precautions and interim -follow up advised in 1 month if symptoms not resolved -of course, we advised Kimberly Maynard  to return or notify a doctor immediately if symptoms worsen or persist or new concerns arise  Hypothyroidism, unspecified type - Plan: TSH  Hyperlipidemia, unspecified hyperlipidemia type  Osteoarthritis of multiple joints, unspecified osteoarthritis type -regular activity advise, over-the-counter options for symptom control discussed, also offered physical therapy if she wishes  Non-seasonal allergic rhinitis, unspecified trigger -refilled Flonase  -Patient advised to return or notify a doctor immediately if symptoms worsen or persist or new concerns arise.  Patient Instructions  BEFORE YOU LEAVE: -flu shot -follow up: 3 months for AWV with Kimberly Maynard and follow up with Dr.  Maudie Maynard -labs  -We placed a referral for you as discussed for the vertigo. It usually takes about 1-2 weeks to process and schedule this referral. If you have not heard from [place] regarding this appointment in 2 weeks please contact our office.  Benign Positional Vertigo Vertigo is the feeling that you or your surroundings are moving when they are not. Benign positional vertigo is the most common form of vertigo. The cause of this condition is not serious (is benign). This condition is triggered by certain movements and positions (is positional). This condition can be dangerous if it occurs while you are doing something that could endanger you or others, such as driving. What are the causes? In many cases, the cause of this condition is not known. It may be caused by a disturbance in an area of the inner ear that helps your brain to sense movement and balance. This disturbance can be caused by a viral infection (labyrinthitis), head injury, or repetitive motion. What increases the risk? This condition is more likely to develop in:  Women.  People who are 39 years of age or older.  What are the signs or symptoms? Symptoms of this condition usually happen when you move your head or your eyes in different directions. Symptoms may start suddenly, and they usually last for less than a minute. Symptoms may include:  Loss of balance and falling.  Feeling like you are spinning or moving.  Feeling like your surroundings are spinning or moving.  Nausea and vomiting.  Blurred vision.  Dizziness.  Involuntary eye movement (nystagmus).  Symptoms can be mild and cause only slight annoyance, or they can be severe and interfere with daily life. Episodes of benign positional vertigo may return (recur) over time, and they may be triggered by certain movements. Symptoms may improve over time. How is this diagnosed? This condition is usually diagnosed by medical history and a physical exam of the head, neck,  and ears. You may be referred to a health care provider who specializes in ear, nose, and throat (ENT) problems (otolaryngologist) or a provider who specializes in disorders of the nervous system (neurologist). You may have additional testing, including:  MRI.  A CT scan.  Eye movement tests. Your health care provider may ask you to change positions quickly while he or she watches you for symptoms of benign positional vertigo, such as nystagmus. Eye movement may be tested with an electronystagmogram (ENG), caloric stimulation, the Dix-Hallpike test, or the roll test.  An electroencephalogram (EEG). This records electrical activity in your brain.  Hearing tests.  How is this treated? Usually, your health care provider will treat this by moving your head in specific positions to adjust your inner ear back to normal. Surgery may be needed in severe cases, but this is rare. In some cases, benign positional vertigo may resolve on its own in 2-4 weeks. Follow these instructions at home: Safety  Move slowly.Avoid sudden body or head movements.  Avoid driving.  Avoid operating heavy machinery.  Avoid doing any tasks that would be dangerous to you or others if a vertigo episode would occur.  If you have trouble walking or keeping your balance, try using a cane for stability. If you feel dizzy or unstable, sit down right away.  Return to your normal activities as told by your health care provider. Ask your health care provider what activities are safe for you. General instructions  Take over-the-counter and prescription medicines only as told by your health care provider.  Avoid certain positions or movements as told by your health care provider.  Drink enough fluid to keep your urine clear or pale yellow.  Keep all follow-up visits as told by your health care provider. This is important. Contact a health care provider if:  You have a fever.  Your condition gets worse or you develop  new symptoms.  Your family or friends notice any behavioral changes.  Your nausea or vomiting gets worse.  You have numbness or a "pins and needles" sensation. Get help right away if:  You have difficulty speaking or moving.  You are always dizzy.  You faint.  You develop severe headaches.  You have weakness in your legs or arms.  You have changes in your hearing or vision.  You develop a stiff neck.  You develop sensitivity to light. This information is not intended to replace advice given to you by your health care provider. Make sure you discuss any questions you have with your health care provider. Document Released: 08/20/2006 Document Revised: 04/19/2016 Document Reviewed: 03/07/2015 Elsevier Interactive  Patient Education  2018 [place]., DO

## 2017-08-12 ENCOUNTER — Ambulatory Visit (INDEPENDENT_AMBULATORY_CARE_PROVIDER_SITE_OTHER): Payer: Medicare Other | Admitting: Family Medicine

## 2017-08-12 ENCOUNTER — Encounter: Payer: Self-pay | Admitting: Family Medicine

## 2017-08-12 VITALS — BP 124/60 | HR 72 | Temp 97.4°F | Ht 62.0 in | Wt 121.5 lb

## 2017-08-12 DIAGNOSIS — R42 Dizziness and giddiness: Secondary | ICD-10-CM | POA: Diagnosis not present

## 2017-08-12 DIAGNOSIS — E039 Hypothyroidism, unspecified: Secondary | ICD-10-CM

## 2017-08-12 DIAGNOSIS — Z23 Encounter for immunization: Secondary | ICD-10-CM | POA: Diagnosis not present

## 2017-08-12 DIAGNOSIS — E785 Hyperlipidemia, unspecified: Secondary | ICD-10-CM

## 2017-08-12 DIAGNOSIS — J3089 Other allergic rhinitis: Secondary | ICD-10-CM | POA: Diagnosis not present

## 2017-08-12 DIAGNOSIS — M159 Polyosteoarthritis, unspecified: Secondary | ICD-10-CM

## 2017-08-12 MED ORDER — FLUTICASONE PROPIONATE 50 MCG/ACT NA SUSP: 2.0000 | Freq: Every day | NASAL | 6 refills | Status: DC

## 2017-08-12 NOTE — Patient Instructions (Addendum)
BEFORE YOU LEAVE: -flu shot -follow up: 3 months for AWV with [name] and follow up with [name] -labs  -We placed a referral for you as discussed for the vertigo. It usually takes about 1-2 weeks to process and schedule this referral. If you have not heard from [place] regarding this appointment in 2 weeks please contact our office.  Benign Positional Vertigo Vertigo is the feeling that you or your surroundings are moving when they are not. Benign positional vertigo is the most common form of vertigo. The cause of this condition is not serious (is benign). This condition is triggered by certain movements and positions (is positional). This condition can be dangerous if it occurs while you are doing something that could endanger you or others, such as driving. What are the causes? In many cases, the cause of this condition is not known. It may be caused by a disturbance in an area of the inner ear that helps your brain to sense movement and balance. This disturbance can be caused by a viral infection (labyrinthitis), head injury, or repetitive motion. What increases the risk? This condition is more likely to develop in:  Women.  People who are 28 years of age or older.  What are the signs or symptoms? Symptoms of this condition usually happen when you move your head or your eyes in different directions. Symptoms may start suddenly, and they usually last for less than a minute. Symptoms may include:  Loss of balance and falling.  Feeling like you are spinning or moving.  Feeling like your surroundings are spinning or moving.  Nausea and vomiting.  Blurred vision.  Dizziness.  Involuntary eye movement (nystagmus).  Symptoms can be mild and cause only slight annoyance, or they can be severe and interfere with daily life. Episodes of benign positional vertigo may return (recur) over time, and they may be triggered by certain movements. Symptoms may improve over time. How is this  diagnosed? This condition is usually diagnosed by medical history and a physical exam of the head, neck, and ears. You may be referred to a health care provider who specializes in ear, nose, and throat (ENT) problems (otolaryngologist) or a provider who specializes in disorders of the nervous system (neurologist). You may have additional testing, including:  MRI.  A CT scan.  Eye movement tests. Your health care provider may ask you to change positions quickly while he or she watches you for symptoms of benign positional vertigo, such as nystagmus. Eye movement may be tested with an electronystagmogram (ENG), caloric stimulation, the Dix-Hallpike test, or the roll test.  An electroencephalogram (EEG). This records electrical activity in your brain.  Hearing tests.  How is this treated? Usually, your health care provider will treat this by moving your head in specific positions to adjust your inner ear back to normal. Surgery may be needed in severe cases, but this is rare. In some cases, benign positional vertigo may resolve on its own in 2-4 weeks. Follow these instructions at home: Safety  Move slowly.Avoid sudden body or head movements.  Avoid driving.  Avoid operating heavy machinery.  Avoid doing any tasks that would be dangerous to you or others if a vertigo episode would occur.  If you have trouble walking or keeping your balance, try using a cane for stability. If you feel dizzy or unstable, sit down right away.  Return to your normal activities as told by your health care provider. Ask your health care provider what activities are safe  for you. General instructions  Take over-the-counter and prescription medicines only as told by your health care provider.  Avoid certain positions or movements as told by your health care provider.  Drink enough fluid to keep your urine clear or pale yellow.  Keep all follow-up visits as told by your health care provider. This is  important. Contact a health care provider if:  You have a fever.  Your condition gets worse or you develop new symptoms.  Your family or friends notice any behavioral changes.  Your nausea or vomiting gets worse.  You have numbness or a "pins and needles" sensation. Get help right away if:  You have difficulty speaking or moving.  You are always dizzy.  You faint.  You develop severe headaches.  You have weakness in your legs or arms.  You have changes in your hearing or vision.  You develop a stiff neck.  You develop sensitivity to light. This information is not intended to replace advice given to you by your health care provider. Make sure you discuss any questions you have with your health care provider. Document Released: 08/20/2006 Document Revised: 04/19/2016 Document Reviewed: 03/07/2015 Elsevier Interactive Patient Education  [employer].

## 2017-08-12 NOTE — Addendum Note (Signed)
Addended by: Agnes Lawrence on: 08/12/2017 09:59 AM   Modules accepted: Orders

## 2017-09-16 ENCOUNTER — Ambulatory Visit: Payer: Medicare Other | Attending: Family Medicine | Admitting: Physical Therapy

## 2017-09-16 DIAGNOSIS — H8113 Benign paroxysmal vertigo, bilateral: Secondary | ICD-10-CM

## 2017-09-16 NOTE — Patient Instructions (Signed)
Sit to Side-Lying    Sit on edge of bed. 1. Turn head 45 to right. 2. Maintain head position and lie down slowly on left side. Hold until symptoms subside. 3. Sit up slowly. Hold until symptoms subside. 4. Turn head 45 to left. 5. Maintain head position and lie down slowly on right side. Hold until symptoms subside. 6. Sit up slowly. Repeat sequence __5__ times per session. Do __3__ sessions per day.  LIE DOWN ON RIGHT SIDE FIRST!!  Copyright  VHI. All rights reserved.  Benign Positional Vertigo Vertigo is the feeling that you or your surroundings are moving when they are not. Benign positional vertigo is the most common form of vertigo. The cause of this condition is not serious (is benign). This condition is triggered by certain movements and positions (is positional). This condition can be dangerous if it occurs while you are doing something that could endanger you or others, such as driving. What are the causes? In many cases, the cause of this condition is not known. It may be caused by a disturbance in an area of the inner ear that helps your brain to sense movement and balance. This disturbance can be caused by a viral infection (labyrinthitis), head injury, or repetitive motion. What increases the risk? This condition is more likely to develop in:  Women.  People who are 72 years of age or older.  What are the signs or symptoms? Symptoms of this condition usually happen when you move your head or your eyes in different directions. Symptoms may start suddenly, and they usually last for less than a minute. Symptoms may include:  Loss of balance and falling.  Feeling like you are spinning or moving.  Feeling like your surroundings are spinning or moving.  Nausea and vomiting.  Blurred vision.  Dizziness.  Involuntary eye movement (nystagmus).  Symptoms can be mild and cause only slight annoyance, or they can be severe and interfere with daily life. Episodes of benign  positional vertigo may return (recur) over time, and they may be triggered by certain movements. Symptoms may improve over time. How is this diagnosed? This condition is usually diagnosed by medical history and a physical exam of the head, neck, and ears. You may be referred to a health care provider who specializes in ear, nose, and throat (ENT) problems (otolaryngologist) or a provider who specializes in disorders of the nervous system (neurologist). You may have additional testing, including:  MRI.  A CT scan.  Eye movement tests. Your health care provider may ask you to change positions quickly while he or she watches you for symptoms of benign positional vertigo, such as nystagmus. Eye movement may be tested with an electronystagmogram (ENG), caloric stimulation, the Dix-Hallpike test, or the roll test.  An electroencephalogram (EEG). This records electrical activity in your brain.  Hearing tests.  How is this treated? Usually, your health care provider will treat this by moving your head in specific positions to adjust your inner ear back to normal. Surgery may be needed in severe cases, but this is rare. In some cases, benign positional vertigo may resolve on its own in 2-4 weeks. Follow these instructions at home: Safety  Move slowly.Avoid sudden body or head movements.  Avoid driving.  Avoid operating heavy machinery.  Avoid doing any tasks that would be dangerous to you or others if a vertigo episode would occur.  If you have trouble walking or keeping your balance, try using a cane for stability. If you feel  dizzy or unstable, sit down right away.  Return to your normal activities as told by your health care provider. Ask your health care provider what activities are safe for you. General instructions  Take over-the-counter and prescription medicines only as told by your health care provider.  Avoid certain positions or movements as told by your health care  provider.  Drink enough fluid to keep your urine clear or pale yellow.  Keep all follow-up visits as told by your health care provider. This is important. Contact a health care provider if:  You have a fever.  Your condition gets worse or you develop new symptoms.  Your family or friends notice any behavioral changes.  Your nausea or vomiting gets worse.  You have numbness or a "pins and needles" sensation. Get help right away if:  You have difficulty speaking or moving.  You are always dizzy.  You faint.  You develop severe headaches.  You have weakness in your legs or arms.  You have changes in your hearing or vision.  You develop a stiff neck.  You develop sensitivity to light. This information is not intended to replace advice given to you by your health care provider. Make sure you discuss any questions you have with your health care provider. Document Released: 08/20/2006 Document Revised: 04/19/2016 Document Reviewed: 03/07/2015 Elsevier Interactive Patient Education  [employer].

## 2017-09-17 NOTE — Therapy (Signed)
[facility] [address] [place], [place], [address] Phone: [phone]   Fax:  [phone]  Physical Therapy Evaluation  Patient Details  Name: Kimberly Maynard MRN: [id number] Date of Birth: [birth year] Referring Provider: Colin Benton, DO  Encounter Date: 09/16/2017      PT End of Session - 09/17/17 1934    Visit Number 1   Number of Visits 4   Authorization Type [facility]   Authorization Time Period 09-16-17 - 10-17-17   PT Start Time 0935   PT Stop Time 1018   PT Time Calculation (min) 43 min      Past Medical History:  Diagnosis Date  . ANEMIA 01/15/2008   resolved  . ANEURYSM NOS 08/30/2006   offered imaging to assess stability, no guidelines for this, she declined  . DEPRESSION 05/05/2007  . FASTING HYPERGLYCEMIA 05/05/2007  . HYPERLIPIDEMIA 01/15/2008  . HYPOTHYROIDISM 08/30/2006  . OSTEOPENIA 08/30/2006  . 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 Skin cancer   . Splenic artery aneurysm - last CT 2012 stable 08/16/2014    Past Surgical History:  Procedure Laterality Date  . ABDOMINAL HYSTERECTOMY    . OOPHORECTOMY      There were no vitals filed for this visit.       Subjective Assessment - 09/17/17 1851    Subjective Pt presents to PT accompanied by her daughter and caregiver; states she has had dizziness with certain movements since early September; daughter reports pt fell over curb approx. 2 months ago and vertigo has been worse since that fall   Patient is accompained by: Family member   Pertinent History splenic artery aneurysm; osteopenia;  OA;  depresssion   Patient Stated Goals resolve the vertigo   Currently in Pain? No/denies            [facility] PT Assessment - 09/17/17 0001      Assessment   Medical Diagnosis BPPV   Referring Provider Colin Benton, DO   Onset Date/Surgical Date --  early Sept.     Precautions   Precautions Fall     Balance Screen   Has the patient fallen in the past 6 months Yes   How many times? 2   Has the patient had a decrease in activity level because of a fear of falling?  Yes   Is the patient reluctant to leave their home because of a fear of falling?  No            Vestibular Assessment - 09/17/17 0001      Vestibular Assessment   General Observation Pt is an 83 yr old lady with c/o vertigo that started approx. 8 weeks ago, and worsened after a fall over a curb per daughter's report.  Pt reports she has vertigo with certain movements     Symptom Behavior   Type of Dizziness Spinning   Frequency of Dizziness varies depending on movement   Duration of Dizziness secs to minutes   Aggravating Factors Rolling to right;Sitting with head tilted back;Looking up to the ceiling   Relieving Factors Lying supine     Occulomotor Exam   Occulomotor Alignment Normal   Spontaneous Absent     Positional Testing   Dix-Hallpike Dix-Hallpike Right;Dix-Hallpike Left   Sidelying Test Sidelying Right;Sidelying Left     Dix-Hallpike Right   Dix-Hallpike Right Duration approx. 15 secs   Dix-Hallpike Right Symptoms Upbeat, right rotatory nystagmus     Dix-Hallpike Left  Dix-Hallpike Left Duration approx. 5 secs   Dix-Hallpike Left Symptoms Upbeat, left rotatory nystagmus     Sidelying Right   Sidelying Right Duration approx. 5 secs   Sidelying Right Symptoms No nystagmus     Sidelying Left   Sidelying Left Duration none   Sidelying Left Symptoms No nystagmus        Objective measurements completed on examination: See above findings.     Epley maneuver (1 rep each) performed for Rt and Lt BPPV Instructed in Brandt-Daroff exercises for habituation             PT Education - 09/17/17 1933    Education provided Yes   Education Details BPPV info and Brandt-Daroff exercises   Person(s) Educated Patient;Child(ren)   Methods Explanation;Demonstration;Handout   Comprehension Verbalized  understanding;Returned demonstration             PT Long Term Goals - 09/17/17 1949      PT LONG TERM GOAL #1   Title Pt will have a (-) Rt and Lt Dix-Hallpike test to indicate resolution of BPPV.   Time 4   Period Weeks   Status New   Target Date 10/17/17     PT LONG TERM GOAL #2   Title Pt will perform all bed mobility without c/o vertigo.   Time 4   Period Weeks   Status New   Target Date 10/17/17     PT LONG TERM GOAL #3   Title Independent in HEP for habiutation.   Time 4   Period Weeks   Status New   Target Date 10/17/17                Plan - 09/17/17 1936    Clinical Impression Statement Pt has (+) Rt Dix-Hallpike test with rotary upbeating nystagmus indicative of Rt BPPV and also has (+) Lt Dix-Hallpike test with mild rotary nystagmus indicative of Lt BPPV posterior canalithiasis. Improvement noted in symptoms in 1 full rep of Epley maneuver; pt had difficulty tolerating Epley maneuver for Rt BPPV due to severity of vertigo provoked with treatment   History and Personal Factors relevant to plan of care: depression   Clinical Presentation Evolving   Clinical Decision Making Moderate   Rehab Potential Good   PT Frequency 1x / week   PT Duration 4 weeks   PT Treatment/Interventions ADLs/Self Care Home Management;Canalith Repostioning;Neuromuscular re-education;Therapeutic exercise;Balance training;Vestibular;Patient/family education   PT Next Visit Plan recheck Rt BPPV   PT Home Exercise Plan Brandt-Daroff   Consulted and Agree with Plan of Care Patient;Family member/caregiver   Family Member Consulted daughter      Patient will benefit from skilled therapeutic intervention in order to improve the following deficits and impairments:  Dizziness, Decreased balance  Visit Diagnosis: BPPV (benign paroxysmal positional vertigo), bilateral - Plan: PT plan of care cert/re-cert      G-Codes - 57/32/20 1954    Functional Assessment Tool Used (Outpatient  Only) (+) Rt and Lt Dix-Hallpike tests    Functional Limitation Changing and maintaining body position   Changing and Maintaining Body Position Current Status (U5427) At least 60 percent but less than 80 percent impaired, limited or restricted   Changing and Maintaining Body Position Goal Status (C6237) At least 20 percent but less than 40 percent impaired, limited or restricted       Problem List Patient Active Problem List   Diagnosis Date Noted  . Osteoarthritis 08/16/2014  . Hyperlipemia 01/15/2008  . Hypothyroidism 08/30/2006  . OSTEOPENIA  08/30/2006    Alda Lea, PT 09/17/2017, 8:01 PM  [facility] [address] [place], [place], [address] Phone: [phone]   Fax:  [phone]  Name: Kimberly Maynard MRN: [id number] Date of Birth: [birth year]

## 2017-09-26 ENCOUNTER — Ambulatory Visit: Payer: Medicare Other | Attending: Family Medicine | Admitting: Physical Therapy

## 2017-09-26 DIAGNOSIS — H8113 Benign paroxysmal vertigo, bilateral: Secondary | ICD-10-CM | POA: Diagnosis not present

## 2017-09-27 NOTE — Therapy (Signed)
[facility] [address] [place], [place], [address] Phone: [phone]   Fax:  [phone]  Physical Therapy Treatment  Patient Details  Name: Kimberly Maynard MRN: [id number] Date of Birth: 1928-11-20 Referring Provider: Colin Benton, DO  Encounter Date: 09/26/2017      PT End of Session - 09/27/17 0931    Visit Number 2   Number of Visits 4   Authorization Type [facility]   Authorization Time Period 09-16-17 - 10-17-17   PT Start Time 1105   PT Stop Time 1138  D/C - finished early   PT Time Calculation (min) 33 min      Past Medical History:  Diagnosis Date  . ANEMIA 01/15/2008   resolved  . ANEURYSM NOS 08/30/2006   offered imaging to assess stability, no guidelines for this, she declined  . DEPRESSION 05/05/2007  . FASTING HYPERGLYCEMIA 05/05/2007  . HYPERLIPIDEMIA 01/15/2008  . HYPOTHYROIDISM 08/30/2006  . OSTEOPENIA 08/30/2006  . 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 Skin cancer   . Splenic artery aneurysm - last CT 2012 stable 08/16/2014    Past Surgical History:  Procedure Laterality Date  . ABDOMINAL HYSTERECTOMY    . OOPHORECTOMY      There were no vitals filed for this visit.      Subjective Assessment - 09/27/17 0929    Subjective Pt reports no vertigo at this time -"I feel so much better than I did"   Patient is accompained by: Family member   Pertinent History splenic artery aneurysm; osteopenia;  OA;  depresssion   Patient Stated Goals resolve the vertigo   Currently in Pain? No/denies                          Vestibular Treatment/Exercise - 09/27/17 0001      Vestibular Treatment/Exercise   Habituation Exercises Legrand Como Daroff   Number of Reps  1   Symptom Description  no vertigo and no nystagmus observed      Rt and Lt Dix-Hallpike tests negative with no c/o vertigo and no nystagmus Rt and Lt sidelying tests  negative with no c/o vertigo and no nystagmus  Pt reports "blurriness" in Rt eye (due to cataract?) and reports she feels "dripping" inside her head with sit to/from supine  Transitional movements -- Due to sinus drainage?  Recommended to pt and daughter that she follow up with MD regarding these 2 c/o's (blurriness in Rt eye and drainage) due to These problems not being vestibular related  Reviewed progress and LTG's - also reviewed Brandt-Daroff exercises to do only if BPPV re-occurred             PT Long Term Goals - 09/27/17 5638      PT LONG TERM GOAL #1   Title Pt will have a (-) Rt and Lt Dix-Hallpike test to indicate resolution of BPPV.   Baseline met 09-26-17   Status Achieved     PT LONG TERM GOAL #2   Title Pt will perform all bed mobility without c/o vertigo.   Baseline met 09-26-17   Status Achieved     PT LONG TERM GOAL #3   Title Independent in HEP for habiutation.   Baseline 09-26-17   Status Achieved               Plan - 09/27/17 0937    Clinical Impression Statement  Pt has met 3/3 LTG's - pt reports no vertigo at this time; balance is WNL's - pt reports she is ready for D/C due to resolution of BPPV   Rehab Potential Good   PT Frequency 1x / week   PT Duration 4 weeks   PT Treatment/Interventions ADLs/Self Care Home Management;Canalith Repostioning;Neuromuscular re-education;Therapeutic exercise;Balance training;Vestibular;Patient/family education   PT Next Visit Plan D/C   PT Home Exercise Plan Brandt-Daroff   Consulted and Agree with Plan of Care Patient;Family member/caregiver   Family Member Consulted daughter      Patient will benefit from skilled therapeutic intervention in order to improve the following deficits and impairments:  Dizziness, Decreased balance  Visit Diagnosis: BPPV (benign paroxysmal positional vertigo), bilateral       G-Codes - Oct 19, 2017 0939    Functional Assessment Tool Used (Outpatient Only) No vertigo at  current time   Functional Limitation Changing and maintaining body position   Changing and Maintaining Body Position Goal Status (P5361) At least 20 percent but less than 40 percent impaired, limited or restricted   Changing and Maintaining Body Position Discharge Status (W4315) 0 percent impaired, limited or restricted      Problem List Patient Active Problem List   Diagnosis Date Noted  . Osteoarthritis 08/16/2014  . Hyperlipemia 01/15/2008  . Hypothyroidism 08/30/2006  . OSTEOPENIA 08/30/2006    PHYSICAL THERAPY DISCHARGE SUMMARY  Visits from Start of Care: 2  Current functional level related to goals / functional outcomes: See above for progress towards goals - all LTG's met - BPPV resolved at this time   Remaining deficits: Pt reports blurriness in Rt eye and drainage with supine to/from sit with feeling "dripping inside head"   Education / Equipment: Pt has been instructed in Brandt-Daroff exercises prn should BPPV re-occur Plan: Patient agrees to discharge.  Patient goals were met. Patient is being discharged due to meeting the stated rehab goals.  ?????        Alda Lea, PT 09/27/2017, 9:40 AM  [facility] [address] [place], [place], [address] Phone: [phone]   Fax:  [phone]  Name: Kimberly Maynard MRN: [id number] Date of Birth: 14-Oct-1928

## 2017-10-23 ENCOUNTER — Telehealth: Payer: Self-pay

## 2017-10-23 NOTE — Telephone Encounter (Signed)
Call to Ms. Goebel to reschedule her AWV this year. Need to schedule prior to [name] on Tuesday or a Friday or after 12/20 on Tuesday or Friday. Left VM; April Dtr may cal back   [place]

## 2017-10-31 ENCOUNTER — Telehealth: Payer: Self-pay | Admitting: Family Medicine

## 2017-10-31 NOTE — Telephone Encounter (Signed)
The patient was called originally to schedule AWV on the from a Thursday to Wed. That was completed, so I am not sure why she is calling today?  She has a future apt for AWV and with [name]

## 2017-10-31 NOTE — Telephone Encounter (Signed)
Copied from [place]. Topic: General - Other >> Oct 31, 2017  1:40 PM Bea Graff, NT wrote: Reason for CRM: Pt returning call to office. Do not see a CRM of why pt was called. Please advise.

## 2017-11-13 ENCOUNTER — Ambulatory Visit (INDEPENDENT_AMBULATORY_CARE_PROVIDER_SITE_OTHER): Payer: Medicare Other

## 2017-11-13 VITALS — BP 110/50 | Ht 60.0 in | Wt 122.0 lb

## 2017-11-13 DIAGNOSIS — Z0000 Encounter for general adult medical examination without abnormal findings: Secondary | ICD-10-CM | POA: Diagnosis not present

## 2017-11-13 NOTE — Patient Instructions (Addendum)
Kimberly Maynard , Thank you for taking time to come for your Medicare Wellness Visit. I appreciate your ongoing commitment to your health goals. Please review the following plan we discussed and let me know if I can assist you in the future.   Resource for adpative hearing equipment; [facility]  No reviews  [facility]  [address]  [phone]  Lost hearing aid due to a fall  Dr. Lenda Kelp - had no issue with hearing aids  Advised to call Dr. Geralynn Ochs for free consultation or other wise estimate charges   Can come in and try out all of the equipment  May have sliding scale assistance if meets financial guidelines for assistance [facility] - can assist with hearing aid x 1  No reviews  [facility]  [address]  [phone]  http://[email or web address]-[email or web address]/default/files/hearingpedia/Guide_How_to_Buy_Hearing_Aids.pdf  Shingrix is a vaccine for the prevention of Shingles in Adults 50 and older.  If you are on Medicare, you can request a prescription from your doctor to be filled at a pharmacy.  Please check with your benefits regarding applicable copays or out of pocket expenses.  The Shingrix is given in 2 vaccines approx 8 weeks apart. You must receive the 2nd dose prior to 6 months from receipt of the first.      These are the goals we discussed: Goals    . Patient Stated     Work on gait  Will discuss with [name]       This is a list of the screening recommended for you and due dates:  Health Maintenance  Topic Date Due  . Tetanus Vaccine  03/11/2025  . Flu Shot  Completed  . DEXA scan (bone density measurement)  Completed      Fall Prevention in the Home Falls can cause injuries. They can happen to people of all ages. There are many things you can do to make your home safe and to help prevent falls. What can I do on the outside of my  home?  Regularly fix the edges of walkways and driveways and fix any cracks.  Remove anything that might make you trip as you walk through a door, such as a raised step or threshold.  Trim any bushes or trees on the path to your home.  Use bright outdoor lighting.  Clear any walking paths of anything that might make someone trip, such as rocks or tools.  Regularly check to see if handrails are loose or broken. Make sure that both sides of any steps have handrails.  Any raised decks and porches should have guardrails on the edges.  Have any leaves, snow, or ice cleared regularly.  Use sand or salt on walking paths during winter.  Clean up any spills in your garage right away. This includes oil or grease spills. What can I do in the bathroom?  Use night lights.  Install grab bars by the toilet and in the tub and shower. Do not use towel bars as grab bars.  Use non-skid mats or decals in the tub or shower.  If you need to sit down in the shower, use a plastic, non-slip stool.  Keep the floor dry. Clean up any water that spills on the floor as soon as it happens.  Remove soap buildup in the tub or shower regularly.  Attach bath mats securely with double-sided non-slip rug tape.  Do not have throw rugs and other things on the floor that can make you trip. What can I do in the bedroom?  Use night lights.  Make sure that you have a light by your bed that is easy to reach.  Do not use any sheets or blankets that are too big for your bed. They should not hang down onto the floor.  Have a firm chair that has side arms. You can use this for support while you get dressed.  Do not have throw rugs and other things on the floor that can make you trip. What can I do in the kitchen?  Clean up any spills right away.  Avoid walking on wet floors.  Keep items that you use a lot in easy-to-reach places.  If you need to reach something above you, use a strong step stool that has a  grab bar.  Keep electrical cords out of the way.  Do not use floor polish or wax that makes floors slippery. If you must use wax, use non-skid floor wax.  Do not have throw rugs and other things on the floor that can make you trip. What can I do with my stairs?  Do not leave any items on the stairs.  Make sure that there are handrails on both sides of the stairs and use them. Fix handrails that are broken or loose. Make sure that handrails are as long as the stairways.  Check any carpeting to make sure that it is firmly attached to the stairs. Fix any carpet that is loose or worn.  Avoid having throw rugs at the top or bottom of the stairs. If you do have throw rugs, attach them to the floor with carpet tape.  Make sure that you have a light switch at the top of the stairs and the bottom of the stairs. If you do not have them, ask someone to add them for you. What else can I do to help prevent falls?  Wear shoes that: ? Do not have high heels. ? Have rubber bottoms. ? Are comfortable and fit you well. ? Are closed at the toe. Do not wear sandals.  If you use a stepladder: ? Make sure that it is fully opened. Do not climb a closed stepladder. ? Make sure that both sides of the stepladder are locked into place. ? Ask someone to hold it for you, if possible.  Clearly mark and make sure that you can see: ? Any grab bars or handrails. ? First and last steps. ? Where the edge of each step is.  Use tools that help you move around (mobility aids) if they are needed. These include: ? Canes. ? Walkers. ? Scooters. ? Crutches.  Turn on the lights when you go into a dark area. Replace any light bulbs as soon as they burn out.  Set up your furniture so you have a clear path. Avoid moving your furniture around.  If any of your floors are uneven, fix them.  If there are any pets around you, be aware of where they are.  Review your medicines with your doctor. Some medicines can make  you feel dizzy. This can increase your chance of falling. Ask your doctor what other things that you can do to help prevent falls. This information is not intended to replace advice given to you by your health care provider. Make sure you discuss any questions you have with your health care provider. Document Released: 09/08/2009 Document Revised:  04/19/2016 Document Reviewed: 12/17/2014 Elsevier Interactive Patient Education  2018 [place] Maintenance, Female Adopting a healthy lifestyle and getting preventive care can go a long way to promote health and wellness. Talk with your health care provider about what schedule of regular examinations is right for you. This is a good chance for you to check in with your provider about disease prevention and staying healthy. In between checkups, there are plenty of things you can do on your own. Experts have done a lot of research about which lifestyle changes and preventive measures are most likely to keep you healthy. Ask your health care provider for more information. Weight and diet Eat a healthy diet  Be sure to include plenty of vegetables, fruits, low-fat dairy products, and lean protein.  Do not eat a lot of foods high in solid fats, added sugars, or salt.  Get regular exercise. This is one of the most important things you can do for your health. ? Most adults should exercise for at least 150 minutes each week. The exercise should increase your heart rate and make you sweat (moderate-intensity exercise). ? Most adults should also do strengthening exercises at least twice a week. This is in addition to the moderate-intensity exercise.  Maintain a healthy weight  Body mass index (BMI) is a measurement that can be used to identify possible weight problems. It estimates body fat based on height and weight. Your health care provider can help determine your BMI and help you achieve or maintain a healthy weight.  For females 74 years of  age and older: ? A BMI below 18.5 is considered underweight. ? A BMI of 18.5 to 24.9 is normal. ? A BMI of 25 to 29.9 is considered overweight. ? A BMI of 30 and above is considered obese.  Watch levels of cholesterol and blood lipids  You should start having your blood tested for lipids and cholesterol at 81 years of age, then have this test every 5 years.  You may need to have your cholesterol levels checked more often if: ? Your lipid or cholesterol levels are high. ? You are older than 81 years of age. ? You are at high risk for heart disease.  Cancer screening Lung Cancer  Lung cancer screening is recommended for adults 102-22 years old who are at high risk for lung cancer because of a history of smoking.  A yearly low-dose CT scan of the lungs is recommended for people who: ? Currently smoke. ? Have quit within the past 15 years. ? Have at least a 30-pack-year history of smoking. A pack year is smoking an average of one pack of cigarettes a day for 1 year.  Yearly screening should continue until it has been 15 years since you quit.  Yearly screening should stop if you develop a health problem that would prevent you from having lung cancer treatment.  Breast Cancer  Practice breast self-awareness. This means understanding how your breasts normally appear and feel.  It also means doing regular breast self-exams. Let your health care provider know about any changes, no matter how small.  If you are in your 20s or 30s, you should have a clinical breast exam (CBE) by a health care provider every 1-3 years as part of a regular health exam.  If you are 81 or older, have a CBE every year. Also consider having a breast X-ray (mammogram) every year.  If you have a family history of breast cancer, talk to your  health care provider about genetic screening.  If you are at high risk for breast cancer, talk to your health care provider about having an MRI and a mammogram every  year.  Breast cancer gene (BRCA) assessment is recommended for women who have family members with BRCA-related cancers. BRCA-related cancers include: ? Breast. ? Ovarian. ? Tubal. ? Peritoneal cancers.  Results of the assessment will determine the need for genetic counseling and BRCA1 and BRCA2 testing.  Cervical Cancer Your health care provider may recommend that you be screened regularly for cancer of the pelvic organs (ovaries, uterus, and vagina). This screening involves a pelvic examination, including checking for microscopic changes to the surface of your cervix (Pap test). You may be encouraged to have this screening done every 3 years, beginning at age 39.  For women ages 34-65, health care providers may recommend pelvic exams and Pap testing every 3 years, or they may recommend the Pap and pelvic exam, combined with testing for human papilloma virus (HPV), every 5 years. Some types of HPV increase your risk of cervical cancer. Testing for HPV may also be done on women of any age with unclear Pap test results.  Other health care providers may not recommend any screening for nonpregnant women who are considered low risk for pelvic cancer and who do not have symptoms. Ask your health care provider if a screening pelvic exam is right for you.  If you have had past treatment for cervical cancer or a condition that could lead to cancer, you need Pap tests and screening for cancer for at least 20 years after your treatment. If Pap tests have been discontinued, your risk factors (such as having a new sexual partner) need to be reassessed to determine if screening should resume. Some women have medical problems that increase the chance of getting cervical cancer. In these cases, your health care provider may recommend more frequent screening and Pap tests.  Colorectal Cancer  This type of cancer can be detected and often prevented.  Routine colorectal cancer screening usually begins at 81  years of age and continues through 81 years of age.  Your health care provider may recommend screening at an earlier age if you have risk factors for colon cancer.  Your health care provider may also recommend using home test kits to check for hidden blood in the stool.  A small camera at the end of a tube can be used to examine your colon directly (sigmoidoscopy or colonoscopy). This is done to check for the earliest forms of colorectal cancer.  Routine screening usually begins at age 13.  Direct examination of the colon should be repeated every 5-10 years through 81 years of age. However, you may need to be screened more often if early forms of precancerous polyps or small growths are found.  Skin Cancer  Check your skin from head to toe regularly.  Tell your health care provider about any new moles or changes in moles, especially if there is a change in a mole's shape or color.  Also tell your health care provider if you have a mole that is larger than the size of a pencil eraser.  Always use sunscreen. Apply sunscreen liberally and repeatedly throughout the day.  Protect yourself by wearing long sleeves, pants, a wide-brimmed hat, and sunglasses whenever you are outside.  Heart disease, diabetes, and high blood pressure  High blood pressure causes heart disease and increases the risk of stroke. High blood pressure is more likely to  develop in: ? People who have blood pressure in the high end of the normal range (130-139/85-89 mm Hg). ? People who are overweight or obese. ? People who are African American.  If you are 8-14 years of age, have your blood pressure checked every 3-5 years. If you are 69 years of age or older, have your blood pressure checked every year. You should have your blood pressure measured twice-once when you are at a hospital or clinic, and once when you are not at a hospital or clinic. Record the average of the two measurements. To check your blood pressure  when you are not at a hospital or clinic, you can use: ? An automated blood pressure machine at a pharmacy. ? A home blood pressure monitor.  If you are between 60 years and 4 years old, ask your health care provider if you should take aspirin to prevent strokes.  Have regular diabetes screenings. This involves taking a blood sample to check your fasting blood sugar level. ? If you are at a normal weight and have a low risk for diabetes, have this test once every three years after 81 years of age. ? If you are overweight and have a high risk for diabetes, consider being tested at a younger age or more often. Preventing infection Hepatitis B  If you have a higher risk for hepatitis B, you should be screened for this virus. You are considered at high risk for hepatitis B if: ? You were born in a country where hepatitis B is common. Ask your health care provider which countries are considered high risk. ? Your parents were born in a high-risk country, and you have not been immunized against hepatitis B (hepatitis B vaccine). ? You have HIV or AIDS. ? You use needles to inject street drugs. ? You live with someone who has hepatitis B. ? You have had sex with someone who has hepatitis B. ? You get hemodialysis treatment. ? You take certain medicines for conditions, including cancer, organ transplantation, and autoimmune conditions.  Hepatitis C  Blood testing is recommended for: ? Everyone born from [birth year] through 1963. ? Anyone with known risk factors for hepatitis C.  Sexually transmitted infections (STIs)  You should be screened for sexually transmitted infections (STIs) including gonorrhea and chlamydia if: ? You are sexually active and are younger than 81 years of age. ? You are older than 81 years of age and your health care provider tells you that you are at risk for this type of infection. ? Your sexual activity has changed since you were last screened and you are at an increased  risk for chlamydia or gonorrhea. Ask your health care provider if you are at risk.  If you do not have HIV, but are at risk, it may be recommended that you take a prescription medicine daily to prevent HIV infection. This is called pre-exposure prophylaxis (PrEP). You are considered at risk if: ? You are sexually active and do not regularly use condoms or know the HIV status of your partner(s). ? You take drugs by injection. ? You are sexually active with a partner who has HIV.  Talk with your health care provider about whether you are at high risk of being infected with HIV. If you choose to begin PrEP, you should first be tested for HIV. You should then be tested every 3 months for as long as you are taking PrEP. Pregnancy  If you are premenopausal and you may become pregnant,  ask your health care provider about preconception counseling.  If you may become pregnant, take 400 to 800 micrograms (mcg) of folic acid every day.  If you want to prevent pregnancy, talk to your health care provider about birth control (contraception). Osteoporosis and menopause  Osteoporosis is a disease in which the bones lose minerals and strength with aging. This can result in serious bone fractures. Your risk for osteoporosis can be identified using a bone density scan.  If you are 28 years of age or older, or if you are at risk for osteoporosis and fractures, ask your health care provider if you should be screened.  Ask your health care provider whether you should take a calcium or vitamin D supplement to lower your risk for osteoporosis.  Menopause may have certain physical symptoms and risks.  Hormone replacement therapy may reduce some of these symptoms and risks. Talk to your health care provider about whether hormone replacement therapy is right for you. Follow these instructions at home:  Schedule regular health, dental, and eye exams.  Stay current with your immunizations.  Do not use any  tobacco products including cigarettes, chewing tobacco, or electronic cigarettes.  If you are pregnant, do not drink alcohol.  If you are breastfeeding, limit how much and how often you drink alcohol.  Limit alcohol intake to no more than 1 drink per day for nonpregnant women. One drink equals 12 ounces of beer, 5 ounces of wine, or 1 ounces of hard liquor.  Do not use street drugs.  Do not share needles.  Ask your health care provider for help if you need support or information about quitting drugs.  Tell your health care provider if you often feel depressed.  Tell your health care provider if you have ever been abused or do not feel safe at home. This information is not intended to replace advice given to you by your health care provider. Make sure you discuss any questions you have with your health care provider. Document Released: 05/28/2011 Document Revised: 04/19/2016 Document Reviewed: 08/16/2015 Elsevier Interactive Patient Education  2018 [employer].   Hearing Loss Hearing loss is a partial or total loss of the ability to hear. This can be temporary or permanent, and it can happen in one or both ears. Hearing loss may be referred to as deafness. Medical care is necessary to treat hearing loss properly and to prevent the condition from getting worse. Your hearing may partially or completely come back, depending on what caused your hearing loss and how severe it is. In some cases, hearing loss is permanent. What are the causes? Common causes of hearing loss include:  Too much wax in the ear canal.  Infection of the ear canal or middle ear.  Fluid in the middle ear.  Injury to the ear or surrounding area.  An object stuck in the ear.  Prolonged exposure to loud sounds, such as music.  Less common causes of hearing loss include:  Tumors in the ear.  Viral or bacterial infections, such as meningitis.  A hole in the eardrum (perforated eardrum).  Problems with  the hearing nerve that sends signals between the brain and the ear.  Certain medicines.  What are the signs or symptoms? Symptoms of this condition may include:  Difficulty telling the difference between sounds.  Difficulty following a conversation when there is background noise.  Lack of response to sounds in your environment. This may be most noticeable when you do not respond to startling  sounds.  Needing to turn up the volume on the television, radio, etc.  Ringing in the ears.  Dizziness.  Pain in the ears.  How is this diagnosed? This condition is diagnosed based on a physical exam and a hearing test (audiometry). The audiometry test will be performed by a hearing specialist (audiologist). You may also be referred to an ear, nose, and throat (ENT) specialist (otolaryngologist). How is this treated? Treatment for recent onset of hearing loss may include:  Ear wax removal.  Being prescribed medicines to prevent infection (antibiotics).  Being prescribed medicines to reduce inflammation (corticosteroids).  Follow these instructions at home:  If you were prescribed an antibiotic medicine, take it as told by your health care provider. Do not stop taking the antibiotic even if you start to feel better.  Take over-the-counter and prescription medicines only as told by your health care provider.  Avoid loud noises.  Return to your normal activities as told by your health care provider. Ask your health care provider what activities are safe for you.  Keep all follow-up visits as told by your health care provider. This is important. Contact a health care provider if:  You feel dizzy.  You develop new symptoms.  You vomit or feel nauseous.  You have a fever. Get help right away if:  You develop sudden changes in your vision.  You have severe ear pain.  You have new or increased weakness.  You have a severe headache. This information is not intended to replace  advice given to you by your health care provider. Make sure you discuss any questions you have with your health care provider. Document Released: 11/12/2005 Document Revised: 04/19/2016 Document Reviewed: 03/30/2015 Elsevier Interactive Patient Education  2018 [employer].

## 2017-11-13 NOTE — Progress Notes (Addendum)
Subjective:   Kimberly Maynard is a 83 y.o. female who presents for Medicare Annual (Subsequent) preventive examination.   Kimberly Maynard is a 83 y.o. female who presents for Medicare Annual (Subsequent) preventive examination.  The Patient was informed that the wellness visit is to identify future health risk and educate and initiate measures that can reduce risk for increased disease through the lifespan.    NO ROS; Medicare Wellness Visit  Psychosocial Lives with dtr; April and son in law; also disabled 7 children; 5 girls and 2 boy  2 are in [facility] and one is in [place];  No changes  Did eat more  "states she eats when she feels like eating"   Exercise;  Helps with cleaning She is very active in the home   There are no preventive care reminders to display for this patient. Preventive Screening -Counseling & Management  Colonoscopy 02/2009v-states she has never had  Declines to have another  Mammogram 12/2007 - had x 1 and stated this scared her and didn't go back  dexa 05/2011 WNL; per report Encouraged exercise or sittersize   Zoster 05/2012;  Educated regarding the shingrix        Objective:     Vitals: BP (!) 110/50   Ht 5' (1.524 m)   Wt 122 lb (55.3 kg)   BMI 23.83 kg/m   Body mass index is 23.83 kg/m.  Advanced Directives 11/13/2017 03/10/2017 11/02/2016 03/12/2015  Does Patient Have a Medical Advance Directive? Yes No No No  Would patient like information on creating a medical advance directive? - - - No - patient declined information    Tobacco Social History   Tobacco Use  Smoking Status Never Smoker  Smokeless Tobacco Never Used     Counseling given: Yes   Clinical Intake:       Past Medical History:  Diagnosis Date  . ANEMIA 01/15/2008   resolved  . ANEURYSM NOS 08/30/2006   offered imaging to assess stability, no guidelines for this, she declined  . DEPRESSION 05/05/2007  . FASTING HYPERGLYCEMIA 05/05/2007  . HYPERLIPIDEMIA  01/15/2008  . HYPOTHYROIDISM 08/30/2006  . OSTEOPENIA 08/30/2006  . 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 Skin cancer   . Splenic artery aneurysm - last CT 2012 stable 08/16/2014   Past Surgical History:  Procedure Laterality Date  . ABDOMINAL HYSTERECTOMY    . OOPHORECTOMY     Family History  Problem Relation Age of Onset  . Diabetes Father   . Diabetes Sister   . Cancer Brother        pancreatic, prostate  . Diabetes Brother    Social History   Socioeconomic History  . Marital status: Widowed    Spouse name: Not on file  . Number of children: Not on file  . Years of education: Not on file  . Highest education level: Not on file  Social Needs  . Financial resource strain: Not on file  . Food insecurity - worry: Not on file  . Food insecurity - inability: Not on file  . Transportation needs - medical: Not on file  . Transportation needs - non-medical: Not on file  Occupational History  . Not on file  Tobacco Use  . Smoking status: Never Smoker  . Smokeless tobacco: Never Used  Substance and Sexual Activity  . Alcohol use: No  . Drug use: No  . Sexual activity: Not on file  Other Topics Concern  .  Not on file  Social History Narrative   Work or School: not work      Home Situation: living with daughter      Spiritual Beliefs: none      Lifestyle: works in the yard - no regular exercise; diet is good             Outpatient Encounter Medications as of 11/13/2017  Medication Sig  . acetaminophen (TYLENOL) 650 MG CR tablet Take 1 tablet (650 mg total) by mouth as needed for pain.  . fluticasone (FLONASE) 50 MCG/ACT nasal spray Place 2 sprays into both nostrils daily.  Marland Kitchen levothyroxine (SYNTHROID, LEVOTHROID) 75 MCG tablet TAKE ONE TABLET BY MOUTH ONCE DAILY  . Magnesium-Zinc (MAGNESIUM-CHELATED ZINC PO) Take 1 tablet by mouth daily.   . sodium chloride (AYR) 0.65 % nasal spray Place 1 spray into the nose as needed for congestion.    . fexofenadine (ALLEGRA) 180 MG tablet Take 180 mg by mouth daily.  . predniSONE (DELTASONE) 10 MG tablet 40mg  (4 tablets) on day 1, then 30mg  (3 tablets) for 3 days, then 20mg (2 tablets) for 5 days, then 10mg  (1 tablet ) for 5 days (Patient not taking: Reported on 11/13/2017)   No facility-administered encounter medications on file as of 11/13/2017.     Activities of Daily Living In your present state of health, do you have any difficulty performing the following activities: 11/13/2017  Hearing? Y  Vision? N  Difficulty concentrating or making decisions? (No Data)  Comment comleted MMSE   Walking or climbing stairs? Y  Dressing or bathing? N  Doing errands, shopping? N  Preparing Food and eating ? N  Using the Toilet? N  In the past six months, have you accidently leaked urine? (No Data)  Comment prolapsed bladder in the past; decided she would live with it   Do you have problems with loss of bowel control? N  Managing your Medications? N  Managing your Finances? N  Housekeeping or managing your Housekeeping? N  Some recent data might be hidden    Patient Care Team: Lucretia Kern, DO as PCP - General (Family Medicine)    Assessment:   This is a routine wellness examination for Kimberly Maynard.  Exercise Activities and Dietary recommendations Current Exercise Habits: Home exercise routine, Intensity: Mild  Goals    . Patient Stated     Work on gait  Will discuss with [name]       Fall Risk Fall Risk  11/13/2017 11/02/2016 11/02/2015 03/21/2015 11/24/2014  Falls in the past year? Yes No No Yes Yes  Number falls in past yr: 1 - - - 1  Injury with Fall? - - - - Yes  Follow up Education provided - - - -  Comment PT  - - - -   Shower in the tub No problems getting in and out of tub Lighting  They have 1.5 bath which she shares with her dtr  Loves to do laundry   Timed Get Up and Go performed: 10 but shuffling gait See MD note   Depression Screen PHQ 2/9 Scores  11/13/2017 11/02/2016 11/02/2015 03/21/2015  PHQ - 2 Score 0 0 0 0     Cognitive Function  MMSE - Mini Mental State Exam 11/13/2017  Orientation to time 3  Orientation to Place 5  Registration 3  Attention/ Calculation 0  Attention/Calculation-comments 7th grade ed  Recall 3  Language- name 2 objects 2  Language- repeat 1  Language- follow 3  step command 3  Language- read & follow direction 1  Write a sentence 1  Copy design 0  Copy design-comments can 't see without glasses, waived   Total score 22    TO NOTE: the patient had 7th grade education 23/29  Waived  picture as she did not bring her glasses   6CIT Screen 11/02/2016 11/02/2016  What Year? 0 points 0 points  What month? 0 points 0 points  What time? 0 points 0 points  Count back from 20 0 points 0 points  Months in reverse 0 points 0 points  Repeat phrase 8 points 8 points  Total Score 8 8    Immunization History  Administered Date(s) Administered  . DTP 05/05/2007  . Influenza Split 12/07/2011, 09/25/2012  . Influenza Whole 09/04/2007, 08/16/2008, 09/05/2009, 07/24/2010  . Influenza, High Dose Seasonal PF 09/16/2013, 11/02/2015, 08/13/2016, 08/12/2017  . Influenza,inj,Quad PF,6+ Mos 08/16/2014  . Pneumococcal Conjugate-13 11/24/2014  . Pneumococcal Polysaccharide-23 08/31/2004  . Tdap 12/07/2011, 03/12/2015  . Zoster 06/19/2012    Qualifies for Shingles Vaccine and educated   Screening Tests Health Maintenance  Topic Date Due  . TETANUS/TDAP  03/11/2025  . INFLUENZA VACCINE  Completed  . DEXA SCAN  Completed          Plan:      PCP Notes   Health Maintenance Recent fall and went to therapy  X 2  Completed HCPOA last year    Educated regarding shingrix   Abnormal Screens  Noticed changes in gait; shuffling more May need PT but can discuss with [name]  One fall this past summer, tripped over curb; had 2 PT visits   Hearing (lost hearing aid) given resources   Referrals   None  Patient concerns; Has area of concern on her right inner eye States this area went away and came back x2; no bleeding Raised area ; vision in her left eye is sometimes blurred   Dtr and the patient states she has angry outburst now Does not sleep well; requesting med for increased agitation;  States she can't see "small print" left glasses at home People will come over and she will go in her room. Hearing vs mental status changes MMSE 23/29; waived picture test as she did not bring her glasses [name] to evaluate agitation Also requested the patient fup with lost hearing aid and given references    Cramps in her legs;   Nurse concerns  as noted    Next PCP apt [name] 12/31        I have personally reviewed and noted the following in the patient's chart:   . Medical and social history . Use of alcohol, tobacco or illicit drugs  . Current medications and supplements . Functional ability and status . Nutritional status . Physical activity . Advanced directives . List of other physicians . Hospitalizations, surgeries, and ER visits in previous 12 months . Vitals . Screenings to include cognitive, depression, and falls . Referrals and appointments  In addition, I have reviewed and discussed with patient certain preventive protocols, quality metrics, and best practice recommendations. A written personalized care plan for preventive services as well as general preventive health recommendations were provided to patient.     ACZYS,AYTKZ, RN  11/13/2017  Above notes reviewed in absence of primary provider- who is out of office today.  Agree with above.  Eulas Post MD [facility]

## 2017-11-14 ENCOUNTER — Ambulatory Visit: Payer: Medicare Other | Admitting: Family Medicine

## 2017-11-15 ENCOUNTER — Telehealth: Payer: Self-pay

## 2017-11-15 NOTE — Telephone Encounter (Signed)
Dr. Elease Hashimoto signed AWV as it was completed on 12/19 She has fup visit on 12/31 and it is 15 minutes   Just FYI from notes from AWV if you want to extend to a 30 minute visit.  Please advise   PCP Notes   Health Maintenance Recent fall and went to therapy  X 2  Completed HCPOA last year    Educated regarding shingrix   Abnormal Screens  Noticed changes in gait; shuffling more May need PT but can discuss with [name]  One fall this past summer, tripped over curb; had 2 PT visits   Hearing (lost hearing aid) given resources   Referrals  None  Patient concerns; Has area of concern on her right inner eye States this area went away and came back x2; no bleeding Raised area ; vision in her left eye is sometimes blurred   Dtr and the patient states she has angry outburst now Does not sleep well; requesting med for increased agitation;  States she can't see "small print" left glasses at home People will come over and she will go in her room. Hearing vs mental status changes MMSE 23/29; waived picture test as she did not bring her glasses [name] to evaluate agitation Also requested the patient fup with lost hearing aid and given references    Cramps in her legs;   Nurse concerns  as noted    Next PCP apt [name] 12/31

## 2017-11-15 NOTE — Telephone Encounter (Signed)
If this was a sudden new onset of this symptom, she should be evaluated sooner (ASAP)  to ensure no acute issue.  If a chronic issue going on for more than several weeks and not worsening, please do schedule a 30-minute appointment with me at that time.

## 2017-11-22 NOTE — Telephone Encounter (Signed)
Call to the home and left message to call back if she can reschedule her apt on the 31st to a 30 minute apt so [name] has more time to address her needs.   Given the office number  [facility] RN

## 2017-11-23 NOTE — Progress Notes (Addendum)
HPI:  Kimberly Maynard is a pleasant 83 y.o. here for follow up. Chronic medical problems summarized below were reviewed for changes and stability and were updated as needed below. These issues and their treatment remain stable for the most part.  She has several concerns.  First of all she has a lesion on the right side of her nose that she has noticed for the last 2 months.  It was intermittent at first, now persistent. Second, her daughter has noticed that she is irritable at times.  Seems to snap at people easily.  This is been going on for a long time.  Daughter and patient deny any issues with her memory, odd behaviors, significant  depression, sadness, hallucinations, manic symptoms, weakness, numbness or any other symptoms. Denies CP, SOB, DOE, treatment intolerance or new symptoms.  They would like for her to take medication for this.  She thinks this is due to chronic osteoarthritic pain.  She also sometimes gets leg cramps at night.  None of this is new. She also lost 1 of her hearing aids.  Sees an audiologist, but considering seeing another.  Due for labs - tsh  Chronic issues:  Hypothyroid: -on levothyroxine 18mcg  OA of hands, knees, shoulderand back: -takes some tylenol for this - once daily -The OA in her hands is worse when she does a lot of gardening -did PT in the past for the R shoulder  -stays active  Difficulty Hearing: -sees audiologist  Allergic rhinitis: -uses allegra and flonase prn -goes outside a lot and is worse after being outside -nasal congesiton - clear, sneezing, itchy eyes -thick nasal congestion, shortness of breath, fevers or sinus pain  HLD: -denies hx statin intol  Bladder prolapse: -scheduled with urologist, but may hold off and see urologist if has further issues   ROS: See pertinent positives and negatives per HPI.  Past Medical History:  Diagnosis Date  . ANEMIA 01/15/2008   resolved  . ANEURYSM NOS 08/30/2006   offered  imaging to assess stability, no guidelines for this, she declined  . DEPRESSION 05/05/2007  . FASTING HYPERGLYCEMIA 05/05/2007  . HYPERLIPIDEMIA 01/15/2008  . HYPOTHYROIDISM 08/30/2006  . OSTEOPENIA 08/30/2006  . 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 Skin cancer   . Splenic artery aneurysm - last CT 2012 stable 08/16/2014    Past Surgical History:  Procedure Laterality Date  . ABDOMINAL HYSTERECTOMY    . OOPHORECTOMY      Family History  Problem Relation Age of Onset  . Diabetes Father   . Diabetes Sister   . Cancer Brother        pancreatic, prostate  . Diabetes Brother     Social History   Socioeconomic History  . Marital status: Widowed    Spouse name: None  . Number of children: None  . Years of education: None  . Highest education level: None  Social Needs  . Financial resource strain: None  . Food insecurity - worry: None  . Food insecurity - inability: None  . Transportation needs - medical: None  . Transportation needs - non-medical: None  Occupational History  . None  Tobacco Use  . Smoking status: Never Smoker  . Smokeless tobacco: Never Used  Substance and Sexual Activity  . Alcohol use: No  . Drug use: No  . Sexual activity: None  Other Topics Concern  . None  Social History Narrative   Work or School: not work  Home Situation: living with daughter      Spiritual Beliefs: none      Lifestyle: works in the yard - no regular exercise; diet is good              Current Outpatient Medications:  .  acetaminophen (TYLENOL) 650 MG CR tablet, Take 1 tablet (650 mg total) by mouth as needed for pain., Disp: 30 tablet, Rfl: 0 .  fluticasone (FLONASE) 50 MCG/ACT nasal spray, Place 2 sprays into both nostrils daily., Disp: 16 g, Rfl: 6 .  levothyroxine (SYNTHROID, LEVOTHROID) 75 MCG tablet, TAKE ONE TABLET BY MOUTH ONCE DAILY, Disp: 90 tablet, Rfl: 1 .  Magnesium-Zinc (MAGNESIUM-CHELATED ZINC PO), Take 1 tablet by mouth  daily. , Disp: , Rfl:  .  sodium chloride (AYR) 0.65 % nasal spray, Place 1 spray into the nose as needed for congestion., Disp: , Rfl:  .  DULoxetine (CYMBALTA) 20 MG capsule, Take 1 capsule (20 mg total) by mouth daily., Disp: 30 capsule, Rfl: 3  EXAM:  Vitals:   11/25/17 0957  BP: 138/60  Pulse: 72  Temp: (!) 97.4 F (36.3 C)    Body mass index is 23.55 kg/m.  GENERAL: vitals reviewed and listed above, alert, oriented, appears well hydrated and in no acute distress  HEENT: atraumatic, conjunttiva clear, no obvious abnormalities on inspection of external nose and ears  NECK: no obvious masses on inspection  LUNGS: clear to auscultation bilaterally, no wheezes, rales or rhonchi, good air movement  CV: HRRR, no peripheral edema  SKIN: Scaly papule/hron on the right side of the nose with erythematous irritated base  MS: moves all extremities without noticeable abnormality, OA hands  PSYCH/NEURO: pleasant and cooperative, CN II_XII grossly intact, thought processing and speech grossly intact, no obvious depression or anxiety  ASSESSMENT AND PLAN:  Discussed the following assessment and plan:  Irritability  Hypothyroidism, unspecified type - Plan: TSH  Hearing difficulty of both ears  Other chronic pain - Plan: Basic metabolic panel  Skin lesion of face - Plan: Ambulatory referral to Dermatology  -discussed causes of irritable mood, declined evaluation for dementia and counseling -  suspect mild depression may be most likely, dealing with chronic pain, perhaps sleep issues when she gets leg cramps -Discussed potential and options, they opted to try Cymbalta, start a low dose and follow-up in 1 month -Labs to rule out other -Skin lesion on her face looks like a skin cancer, advised removal with dermatology, referral placed -Advise she see an audiologist regarding her hearing and replacement of her hearing aid, this could be contributing to the mood as well -advised of  options -Patient advised to return or notify a doctor immediately if symptoms worsen or persist or new concerns arise.  Patient Instructions  BEFORE YOU LEAVE: -follow up: 4-6 weeks -labs  -We placed a referral for you as discussed to the dermatologist for removal of the lesion on your nose. If you have not heard from [place] regarding this appointment in 1 week please contact our office.  -START the Cymbalta and take 20mg  once daily  -please see an audiologist about your hearing     Lucretia Kern., DO

## 2017-11-25 ENCOUNTER — Ambulatory Visit (INDEPENDENT_AMBULATORY_CARE_PROVIDER_SITE_OTHER): Payer: Medicare Other | Admitting: Family Medicine

## 2017-11-25 ENCOUNTER — Encounter: Payer: Self-pay | Admitting: Family Medicine

## 2017-11-25 VITALS — BP 138/60 | HR 72 | Temp 97.4°F | Ht 60.0 in | Wt 120.6 lb

## 2017-11-25 DIAGNOSIS — E039 Hypothyroidism, unspecified: Secondary | ICD-10-CM | POA: Diagnosis not present

## 2017-11-25 DIAGNOSIS — R454 Irritability and anger: Secondary | ICD-10-CM

## 2017-11-25 DIAGNOSIS — G8929 Other chronic pain: Secondary | ICD-10-CM

## 2017-11-25 DIAGNOSIS — L989 Disorder of the skin and subcutaneous tissue, unspecified: Secondary | ICD-10-CM

## 2017-11-25 DIAGNOSIS — H9193 Unspecified hearing loss, bilateral: Secondary | ICD-10-CM | POA: Diagnosis not present

## 2017-11-25 LAB — TSH: TSH: 0.51 u[IU]/mL (ref 0.35–4.50)

## 2017-11-25 LAB — BASIC METABOLIC PANEL
BUN: 12 mg/dL (ref 6–23)
CO2: 30 meq/L (ref 19–32)
CREATININE: 0.49 mg/dL (ref 0.40–1.20)
Calcium: 9.1 mg/dL (ref 8.4–10.5)
Chloride: 101 mEq/L (ref 96–112)
GFR: 126.76 mL/min (ref >60.00)
GLUCOSE: 91 mg/dL (ref 70–99)
Potassium: 4.1 mEq/L (ref 3.5–5.1)
SODIUM: 139 meq/L (ref 135–145)

## 2017-11-25 MED ORDER — DULOXETINE HCL 20 MG PO CPEP: 20.0000 mg | ORAL_CAPSULE | Freq: Every day | ORAL | 3 refills | Status: DC

## 2017-11-25 NOTE — Addendum Note (Signed)
Addended by: Lucretia Kern on: 11/25/2017 10:31 AM   Modules accepted: Orders

## 2017-11-25 NOTE — Patient Instructions (Signed)
BEFORE YOU LEAVE: -follow up: 4-6 weeks -labs  -We placed a referral for you as discussed to the dermatologist for removal of the lesion on your nose. If you have not heard from [place] regarding this appointment in 1 week please contact our office.  -START the Cymbalta and take 20mg  once daily  -please see an audiologist about your hearing

## 2017-12-02 ENCOUNTER — Telehealth: Payer: Self-pay | Admitting: Family Medicine

## 2017-12-02 DIAGNOSIS — L989 Disorder of the skin and subcutaneous tissue, unspecified: Secondary | ICD-10-CM

## 2017-12-02 NOTE — Telephone Encounter (Signed)
Copied from [place]. Topic: Quick Communication - See Telephone Encounter >> Dec 02, 2017  9:06 AM Cleaster Corin, NT wrote: CRM for notification. See Telephone encounter for:   12/02/17. Pt. Daughter calling pt. Has not heard anything from dermatologist was told to give [name] a call if nothing was heard by the end of last week  [phone]

## 2017-12-02 NOTE — Telephone Encounter (Signed)
I called the pt and left a detailed message stating our referral coordinator spoke with someone at the [facility] and they are waiting on the physician to review her notes and this can take some time.  I also left a message for her or the daughter to call the [facility] at [phone] if they would like to.

## 2017-12-02 NOTE — Telephone Encounter (Signed)
Copied from [place] [id number]. Topic: Inquiry >> Dec 02, 2017  4:44 PM Kimberly Maynard I, [place] wrote: Reason for CRM:pt call and said Dermatology cant not see her in till 06/09/18 the pt said these is to long to weight

## 2017-12-03 NOTE — Telephone Encounter (Signed)
Please send referral, urgent, possible neoplasm. Let pt know and advise she notify us if issues.Thanks.

## 2017-12-03 NOTE — Telephone Encounter (Signed)
Referral re-entered with urgent priority to see if thept can be seen sooner and I called the pt and left a detailed message at her home number with this information.

## 2017-12-30 DIAGNOSIS — L578 Other skin changes due to chronic exposure to nonionizing radiation: Secondary | ICD-10-CM | POA: Diagnosis not present

## 2017-12-30 DIAGNOSIS — C4492 Squamous cell carcinoma of skin, unspecified: Secondary | ICD-10-CM | POA: Diagnosis not present

## 2017-12-30 DIAGNOSIS — D485 Neoplasm of uncertain behavior of skin: Secondary | ICD-10-CM | POA: Diagnosis not present

## 2018-01-05 NOTE — Progress Notes (Signed)
HPI:  Follow up:  Chronic pain/Depression: -chronic pain from OA -irritability main symptoms at last visit -started cymbalta 11/25/17 with tylenol and topical capsaicin/menthol for pain and depression -She and her daughter both report that she is doing much better, the medication is seem to help her pain and her irritability, her PHQ 9 is 1.  Skin lesion -Saw dermatologist and had removal of the lesion on her nose, is waiting on the results of this biopsy -She also has a small red spot on the tip of her nose that she is concerned about today -Sees her dermatologist next week for follow-up  New issue of upper respiratory symptoms: -Started about 1 week ago -Symptoms include nasal congestion, postnasal drip, cough -He denies fevers, shortness of breath, wheezing, body aches or sinus pain  ROS: See pertinent positives and negatives per HPI.  Past Medical History:  Diagnosis Date  . ANEMIA 01/15/2008   resolved  . ANEURYSM NOS 08/30/2006   offered imaging to assess stability, no guidelines for this, she declined  . DEPRESSION 05/05/2007  . FASTING HYPERGLYCEMIA 05/05/2007  . HYPERLIPIDEMIA 01/15/2008  . HYPOTHYROIDISM 08/30/2006  . OSTEOPENIA 08/30/2006  . 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 Skin cancer   . Splenic artery aneurysm - last CT 2012 stable 08/16/2014    Past Surgical History:  Procedure Laterality Date  . ABDOMINAL HYSTERECTOMY    . OOPHORECTOMY      Family History  Problem Relation Age of Onset  . Diabetes Father   . Diabetes Sister   . Cancer Brother        pancreatic, prostate  . Diabetes Brother     Social History   Socioeconomic History  . Marital status: Widowed    Spouse name: None  . Number of children: None  . Years of education: None  . Highest education level: None  Social Needs  . Financial resource strain: None  . Food insecurity - worry: None  . Food insecurity - inability: None  . Transportation needs  - medical: None  . Transportation needs - non-medical: None  Occupational History  . None  Tobacco Use  . Smoking status: Never Smoker  . Smokeless tobacco: Never Used  Substance and Sexual Activity  . Alcohol use: No  . Drug use: No  . Sexual activity: None  Other Topics Concern  . None  Social History Narrative   Work or School: not work      Home Situation: living with daughter      Spiritual Beliefs: none      Lifestyle: works in the yard - no regular exercise; diet is good              Current Outpatient Medications:  .  acetaminophen (TYLENOL) 650 MG CR tablet, Take 1 tablet (650 mg total) by mouth as needed for pain., Disp: 30 tablet, Rfl: 0 .  DULoxetine (CYMBALTA) 20 MG capsule, Take 1 capsule (20 mg total) by mouth daily., Disp: 30 capsule, Rfl: 3 .  fluticasone (FLONASE) 50 MCG/ACT nasal spray, Place 2 sprays into both nostrils daily., Disp: 16 g, Rfl: 6 .  levothyroxine (SYNTHROID, LEVOTHROID) 75 MCG tablet, TAKE ONE TABLET BY MOUTH ONCE DAILY, Disp: 90 tablet, Rfl: 1 .  Magnesium-Zinc (MAGNESIUM-CHELATED ZINC PO), Take 1 tablet by mouth daily. , Disp: , Rfl:  .  sodium chloride (AYR) 0.65 % nasal spray, Place 1 spray into the nose as needed for congestion., Disp: , Rfl:  .  benzonatate (TESSALON PERLES) 100 MG capsule, Take 1 capsule (100 mg total) by mouth 3 (three) times daily as needed., Disp: 20 capsule, Rfl: 0  EXAM:  Vitals:   01/06/18 0937  BP: (!) 120/58  Pulse: 73  Temp: (!) 97.3 F (36.3 C)    Body mass index is 23.75 kg/m.  GENERAL: vitals reviewed and listed above, alert, oriented, appears well hydrated and in no acute distress  HEENT: atraumatic, conjunttiva clear, no obvious abnormalities on inspection of external nose and ears, normal appearance of ear canals and TMs, clear nasal congestion, mild post oropharyngeal erythema with PND, no tonsillar edema or exudate, no sinus TTP  NECK: no obvious masses on inspection  LUNGS: clear to  auscultation bilaterally, no wheezes, rales or rhonchi, good air movement  CV: HRRR, no peripheral edema  SKIN: Biopsy site right lateral nose, small area of erythema of the nose without erosion or lesion  MS: moves all extremities without noticeable abnormality  PSYCH: pleasant and cooperative, no obvious depression or anxiety  ASSESSMENT AND PLAN:  Discussed the following assessment and plan:  Major depressive disorder, recurrent episode, mild (HCC) Other chronic pain -Seems to be doing much better on the low-dose of Cymbalta, we opted to continue this medication -Follow-up 3-4 months, sooner as needed  Skin lesion -I am glad that she saw dermatology, advised that she mention the other lesion to her dermatologist when she sees them next week  Viral upper respiratory illness -Suspected viral upper neck respiratory infection that is resolving postnasal drip causing the cough.  Discussed options for treatment and she does want to try the [facility] after discussion of risk.  Patient brings a paper from her insurance company regarding a charge.  I brought Sheena in to the room to review and help them address this.  -Patient advised to return or notify a doctor immediately if symptoms worsen or persist or new concerns arise.  Patient Instructions  BEFORE YOU LEAVE: -follow up: 3-4 months  Continue the Cymbalta and take once daily.  Follow-up with your dermatologist about the skin lesions.   INSTRUCTIONS FOR UPPER RESPIRATORY INFECTION:  -plenty of rest and fluids  -nasal saline wash 2-3 times daily (use prepackaged nasal saline or bottled/distilled water if making your own)   -in the winter time, using a humidifier at night is helpful (please follow cleaning instructions)  -if you are taking a cough medication - use only as directed, may also try a teaspoon of honey to coat the throat and throat lozenges. I sent Tessalon to the pharmacy for you.  -for sore throat, salt  water gargles can help  -follow up if you have fevers, facial pain, tooth pain, difficulty breathing or are worsening or symptoms persist longer then expected  Upper Respiratory Infection, Adult An upper respiratory infection (URI) is also known as the common cold. It is often caused by a type of germ (virus). Colds are easily spread (contagious). You can pass it to others by kissing, coughing, sneezing, or drinking out of the same glass. Usually, you get better in 1 to 3  weeks.  However, the cough can last for even longer. HOME CARE   Only take medicine as told by your doctor. Follow instructions provided above.  Drink enough water and fluids to keep your pee (urine) clear or pale yellow.  Get plenty of rest.  Return to work when your temperature is < 100 for 24 hours or as told by your doctor. You may use a face  mask and wash your hands to stop your cold from spreading. GET HELP RIGHT AWAY IF:   After the first few days, you feel you are getting worse.  You have questions about your medicine.  You have chills, shortness of breath, or red spit (mucus).  You have pain in the face for more then 1-2 days, especially when you bend forward.  You have a fever, puffy (swollen) neck, pain when you swallow, or white spots in the back of your throat.  You have a bad headache, ear pain, sinus pain, or chest pain.  You have a high-pitched whistling sound when you breathe in and out (wheezing).  You cough up blood.  You have sore muscles or a stiff neck. MAKE SURE YOU:   Understand these instructions.  Will watch your condition.  Will get help right away if you are not doing well or get worse. Document Released: 04/30/2008 Document Revised: 02/04/2012 Document Reviewed: 02/17/2014 [facility] Patient Information 2015 [place], [place]. This information is not intended to replace advice given to you by your health care provider. Make sure you discuss any questions you have with your health  care provider.     Lucretia Kern, DO

## 2018-01-06 ENCOUNTER — Encounter: Payer: Self-pay | Admitting: Family Medicine

## 2018-01-06 ENCOUNTER — Ambulatory Visit (INDEPENDENT_AMBULATORY_CARE_PROVIDER_SITE_OTHER): Payer: Medicare Other | Admitting: Family Medicine

## 2018-01-06 VITALS — BP 120/58 | HR 73 | Temp 97.3°F | Ht 60.0 in | Wt 121.6 lb

## 2018-01-06 DIAGNOSIS — J069 Acute upper respiratory infection, unspecified: Secondary | ICD-10-CM

## 2018-01-06 DIAGNOSIS — F330 Major depressive disorder, recurrent, mild: Secondary | ICD-10-CM | POA: Diagnosis not present

## 2018-01-06 DIAGNOSIS — L989 Disorder of the skin and subcutaneous tissue, unspecified: Secondary | ICD-10-CM | POA: Diagnosis not present

## 2018-01-06 DIAGNOSIS — G8929 Other chronic pain: Secondary | ICD-10-CM | POA: Diagnosis not present

## 2018-01-06 MED ORDER — BENZONATATE 100 MG PO CAPS: 100.0000 mg | ORAL_CAPSULE | Freq: Three times a day (TID) | ORAL | 0 refills | Status: DC | PRN

## 2018-01-06 NOTE — Patient Instructions (Signed)
BEFORE YOU LEAVE: -follow up: 3-4 months  Continue the Cymbalta and take once daily.  Follow-up with your dermatologist about the skin lesions.   INSTRUCTIONS FOR UPPER RESPIRATORY INFECTION:  -plenty of rest and fluids  -nasal saline wash 2-3 times daily (use prepackaged nasal saline or bottled/distilled water if making your own)   -in the winter time, using a humidifier at night is helpful (please follow cleaning instructions)  -if you are taking a cough medication - use only as directed, may also try a teaspoon of honey to coat the throat and throat lozenges. I sent Tessalon to the pharmacy for you.  -for sore throat, salt water gargles can help  -follow up if you have fevers, facial pain, tooth pain, difficulty breathing or are worsening or symptoms persist longer then expected  Upper Respiratory Infection, Adult An upper respiratory infection (URI) is also known as the common cold. It is often caused by a type of germ (virus). Colds are easily spread (contagious). You can pass it to others by kissing, coughing, sneezing, or drinking out of the same glass. Usually, you get better in 1 to 3  weeks.  However, the cough can last for even longer. HOME CARE   Only take medicine as told by your doctor. Follow instructions provided above.  Drink enough water and fluids to keep your pee (urine) clear or pale yellow.  Get plenty of rest.  Return to work when your temperature is < 100 for 24 hours or as told by your doctor. You may use a face mask and wash your hands to stop your cold from spreading. GET HELP RIGHT AWAY IF:   After the first few days, you feel you are getting worse.  You have questions about your medicine.  You have chills, shortness of breath, or red spit (mucus).  You have pain in the face for more then 1-2 days, especially when you bend forward.  You have a fever, puffy (swollen) neck, pain when you swallow, or white spots in the back of your throat.  You  have a bad headache, ear pain, sinus pain, or chest pain.  You have a high-pitched whistling sound when you breathe in and out (wheezing).  You cough up blood.  You have sore muscles or a stiff neck. MAKE SURE YOU:   Understand these instructions.  Will watch your condition.  Will get help right away if you are not doing well or get worse. Document Released: 04/30/2008 Document Revised: 02/04/2012 Document Reviewed: 02/17/2014 [facility] Patient Information 2015 [place], [place]. This information is not intended to replace advice given to you by your health care provider. Make sure you discuss any questions you have with your health care provider.

## 2018-01-13 DIAGNOSIS — C4492 Squamous cell carcinoma of skin, unspecified: Secondary | ICD-10-CM | POA: Diagnosis not present

## 2018-01-29 ENCOUNTER — Other Ambulatory Visit: Payer: Self-pay | Admitting: Family Medicine

## 2018-02-03 ENCOUNTER — Telehealth: Payer: Self-pay | Admitting: *Deleted

## 2018-02-03 NOTE — Telephone Encounter (Signed)
[facility] faxed a note stating the Sandoz brand Levothyroxine is on back order with no release date and questioned if Mylan could be used instead.  I called [facility] and informed Heather Dr Maudie Mercury does not mind this type of change and she agreed.

## 2018-02-12 DIAGNOSIS — C44321 Squamous cell carcinoma of skin of nose: Secondary | ICD-10-CM | POA: Diagnosis not present

## 2018-03-19 DIAGNOSIS — L738 Other specified follicular disorders: Secondary | ICD-10-CM | POA: Diagnosis not present

## 2018-03-19 DIAGNOSIS — L570 Actinic keratosis: Secondary | ICD-10-CM | POA: Diagnosis not present

## 2018-03-19 DIAGNOSIS — D1801 Hemangioma of skin and subcutaneous tissue: Secondary | ICD-10-CM | POA: Diagnosis not present

## 2018-03-19 DIAGNOSIS — L814 Other melanin hyperpigmentation: Secondary | ICD-10-CM | POA: Diagnosis not present

## 2018-03-25 DIAGNOSIS — H353122 Nonexudative age-related macular degeneration, left eye, intermediate dry stage: Secondary | ICD-10-CM | POA: Diagnosis not present

## 2018-03-25 DIAGNOSIS — H53001 Unspecified amblyopia, right eye: Secondary | ICD-10-CM | POA: Diagnosis not present

## 2018-03-25 DIAGNOSIS — H2521 Age-related cataract, morgagnian type, right eye: Secondary | ICD-10-CM | POA: Diagnosis not present

## 2018-04-02 DIAGNOSIS — H353122 Nonexudative age-related macular degeneration, left eye, intermediate dry stage: Secondary | ICD-10-CM | POA: Diagnosis not present

## 2018-04-02 DIAGNOSIS — H43812 Vitreous degeneration, left eye: Secondary | ICD-10-CM | POA: Diagnosis not present

## 2018-05-04 NOTE — Progress Notes (Signed)
HPI:  Using dictation device. Unfortunately this device frequently misinterprets words/phrases.  Kimberly Maynard is a pleasant 84 y.o. here for follow up. Chronic medical problems summarized below were reviewed for changes. Doing well for the most part but they are stressed about deciding about a cataract surgery. Reports has macular degeneration in one eye and cataract in another. Was told needs complicated cataract surgery. Denies CP, SOB, DOE, treatment intolerance or new symptoms. Due for thyroid check  Hypothyroid: -on levothyroxine 35mcg  Chronic pain/Depression: -chronic pain from OA (knees, hands, back, shoulders) -started cymbalta 11/25/17 with tylenol and topical capsaicin/menthol for pain and depression -She and her daughter both report that she is doing much better, the medication is seem to help her pain and her irritability, her PHQ 9 is 1. -stays active, likes to garden  Difficulty Hearing: -sees audiologist  Allergic rhinitis: -uses allegra and flonase prn -goes outside a lot and is worse after being outside -nasal congesiton - clear, sneezing, itchy eyes -thick nasal congestion, shortness of breath, fevers or sinus pain  HLD: -denies hx statin intol  Bladder prolapse: -scheduled with urologist, but she decided to hold off and see urologist if has further issues   ROS: See pertinent positives and negatives per HPI.  Past Medical History:  Diagnosis Date  . ANEMIA 01/15/2008   resolved  . ANEURYSM NOS 08/30/2006   offered imaging to assess stability, no guidelines for this, she declined  . DEPRESSION 05/05/2007  . FASTING HYPERGLYCEMIA 05/05/2007  . HYPERLIPIDEMIA 01/15/2008  . HYPOTHYROIDISM 08/30/2006  . OSTEOPENIA 08/30/2006  . 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 Skin cancer   . Splenic artery aneurysm - last CT 2012 stable 08/16/2014    Past Surgical History:  Procedure Laterality Date  . ABDOMINAL HYSTERECTOMY     . OOPHORECTOMY      Family History  Problem Relation Age of Onset  . Diabetes Father   . Diabetes Sister   . Cancer Brother        pancreatic, prostate  . Diabetes Brother     SOCIAL HX: see hpi   Current Outpatient Medications:  .  acetaminophen (TYLENOL) 650 MG CR tablet, Take 1 tablet (650 mg total) by mouth as needed for pain., Disp: 30 tablet, Rfl: 0 .  DULoxetine (CYMBALTA) 20 MG capsule, Take 1 capsule (20 mg total) by mouth daily., Disp: 30 capsule, Rfl: 3 .  fluticasone (FLONASE) 50 MCG/ACT nasal spray, Place 2 sprays into both nostrils daily., Disp: 16 g, Rfl: 6 .  levothyroxine (SYNTHROID, LEVOTHROID) 75 MCG tablet, TAKE 1 TABLET BY MOUTH ONCE DAILY, Disp: 90 tablet, Rfl: 1 .  Magnesium-Zinc (MAGNESIUM-CHELATED ZINC PO), Take 1 tablet by mouth daily. , Disp: , Rfl:  .  sodium chloride (AYR) 0.65 % nasal spray, Place 1 spray into the nose as needed for congestion., Disp: , Rfl:   EXAM:  Vitals:   05/05/18 0938  BP: (!) 120/50  Pulse: 70  Temp: 97.6 F (36.4 C)    Body mass index is 23.79 kg/m.  GENERAL: vitals reviewed and listed above, alert, oriented, appears well hydrated and in no acute distress  HEENT: atraumatic, conjunttiva clear, no obvious abnormalities on inspection of external nose and ears  NECK: no obvious masses on inspection  LUNGS: clear to auscultation bilaterally, no wheezes, rales or rhonchi, good air movement  CV: HRRR, no peripheral edema  MS: moves all extremities without noticeable abnormality  PSYCH: pleasant and cooperative, no obvious depression  or anxiety  ASSESSMENT AND PLAN:  Discussed the following assessment and plan:  Hypothyroidism, unspecified type  Osteoarthritis of multiple joints, unspecified osteoarthritis type  Recurrent major depressive disorder, in full remission ([place])  Cataract, unspecified cataract type, unspecified laterality  Macular degeneration, unspecified laterality, unspecified  type  -labs -advised her to discuss risks/benefits of the surgery vs not doing the surgery with her eye doctor  -cont current meds -Patient advised to return or notify a doctor immediately if symptoms worsen or persist or new concerns arise.  Had her see office manager about a bill she got for AWV.  Patient Instructions  BEFORE YOU LEAVE: -meet with Rachel Bo -lab -follow up: 3-4 months  We have ordered labs or studies at this visit. It can take up to 1-2 weeks for results and processing. IF results require follow up or explanation, we will call you with instructions. Clinically stable results will be released to your [facility]. If you have not heard from [place] or cannot find your results in [facility] in 2 weeks please contact our office at [phone].  If you are not yet signed up for [facility], please consider signing up.           Kimberly Kern, DO

## 2018-05-05 ENCOUNTER — Ambulatory Visit (INDEPENDENT_AMBULATORY_CARE_PROVIDER_SITE_OTHER): Payer: Medicare Other | Admitting: Family Medicine

## 2018-05-05 ENCOUNTER — Encounter: Payer: Self-pay | Admitting: Family Medicine

## 2018-05-05 VITALS — BP 120/50 | HR 70 | Temp 97.6°F | Ht 60.0 in | Wt 121.8 lb

## 2018-05-05 DIAGNOSIS — F3342 Major depressive disorder, recurrent, in full remission: Secondary | ICD-10-CM | POA: Diagnosis not present

## 2018-05-05 DIAGNOSIS — H3530 Unspecified macular degeneration: Secondary | ICD-10-CM | POA: Diagnosis not present

## 2018-05-05 DIAGNOSIS — E039 Hypothyroidism, unspecified: Secondary | ICD-10-CM | POA: Diagnosis not present

## 2018-05-05 DIAGNOSIS — M159 Polyosteoarthritis, unspecified: Secondary | ICD-10-CM

## 2018-05-05 DIAGNOSIS — H269 Unspecified cataract: Secondary | ICD-10-CM | POA: Diagnosis not present

## 2018-05-05 LAB — TSH: TSH: 0.41 u[IU]/mL (ref 0.35–4.50)

## 2018-05-05 NOTE — Patient Instructions (Signed)
BEFORE YOU LEAVE: -meet with Dustin -lab -follow up: 3-4 months  We have ordered labs or studies at this visit. It can take up to 1-2 weeks for results and processing. IF results require follow up or explanation, we will call you with instructions. Clinically stable results will be released to your [facility]. If you have not heard from [place] or cannot find your results in [facility] in 2 weeks please contact our office at [phone].  If you are not yet signed up for [facility], please consider signing up.

## 2018-05-05 NOTE — Addendum Note (Signed)
Addended by: Rene Kocher on: 05/05/2018 10:10 AM   Modules accepted: Orders

## 2018-06-03 DIAGNOSIS — R350 Frequency of micturition: Secondary | ICD-10-CM | POA: Diagnosis not present

## 2018-06-16 DIAGNOSIS — L570 Actinic keratosis: Secondary | ICD-10-CM | POA: Diagnosis not present

## 2018-06-16 DIAGNOSIS — Z8589 Personal history of malignant neoplasm of other organs and systems: Secondary | ICD-10-CM | POA: Diagnosis not present

## 2018-06-16 DIAGNOSIS — D225 Melanocytic nevi of trunk: Secondary | ICD-10-CM | POA: Diagnosis not present

## 2018-07-28 ENCOUNTER — Other Ambulatory Visit: Payer: Self-pay | Admitting: Family Medicine

## 2018-07-30 ENCOUNTER — Other Ambulatory Visit: Payer: Self-pay | Admitting: Obstetrics and Gynecology

## 2018-08-10 NOTE — Progress Notes (Signed)
HPI:  Using dictation device. Unfortunately this device frequently misinterprets words/phrases.  Kimberly Maynard is a pleasant 84 y.o. here for follow up. Chronic medical problems summarized below were reviewed for changes.  She reports she forgot to take her Cymbalta and stopped it about a month ago.  She had a little nausea when she tried to take it a few days later.  She currently is not taking it, but feels she does need something for her mood.  She never had any problems with taking it in the past. She currently has a new concern of some diarrhea that started yesterday.  She had 2 episodes of watery diarrhea.  She had a little nausea.  Her symptoms are improving today.  Denies any abdominal pain, hematochezia, melena or fevers.  There are several children living in her house. Otherwise she has been doing okay. Denies CP, SOB, DOE, treatment intolerance or new symptoms. Due for flu shot, due for medicare exam 11/13/18  Hypothyroid: -on levothyroxine 93mcg  Chronic pain/Depression: -chronic pain from OA (knees, hands, back, shoulders) -started cymbalta 11/25/17 with tylenol and topical capsaicin/menthol for pain and depression -She and her daughter both reported that she was doing much better on the Cymbalta -stays active, likes to garden  Difficulty Hearing: -sees audiologist  Allergic rhinitis: -uses allegra and flonase prn -goes outside a lot and is worse after being outside -nasal congesiton - clear, sneezing, itchy eyes -thick nasal congestion, shortness of breath, fevers or sinus pain  HLD: -denies hx statin intol  Bladder prolapse: -Seeing urologist, she tried a pessary but it fell out -She now is considering a bladder sling with the urologist as this is quite a nuisance to her -Does not have recurrent UTIs or pain with the situation  ROS: See pertinent positives and negatives per HPI.  Past Medical History:  Diagnosis Date  . ANEMIA 01/15/2008   resolved  .  ANEURYSM NOS 08/30/2006   offered imaging to assess stability, no guidelines for this, she declined  . DEPRESSION 05/05/2007  . FASTING HYPERGLYCEMIA 05/05/2007  . HYPERLIPIDEMIA 01/15/2008  . HYPOTHYROIDISM 08/30/2006  . OSTEOPENIA 08/30/2006  . 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 Skin cancer   . Splenic artery aneurysm - last CT 2012 stable 08/16/2014    Past Surgical History:  Procedure Laterality Date  . ABDOMINAL HYSTERECTOMY    . OOPHORECTOMY      Family History  Problem Relation Age of Onset  . Diabetes Father   . Diabetes Sister   . Cancer Brother        pancreatic, prostate  . Diabetes Brother     SOCIAL HX: see hpi   Current Outpatient Medications:  .  acetaminophen (TYLENOL) 650 MG CR tablet, Take 1 tablet (650 mg total) by mouth as needed for pain., Disp: 30 tablet, Rfl: 0 .  fluticasone (FLONASE) 50 MCG/ACT nasal spray, Place 2 sprays into both nostrils daily., Disp: 16 g, Rfl: 6 .  levothyroxine (SYNTHROID, LEVOTHROID) 75 MCG tablet, TAKE 1 TABLET BY MOUTH ONCE DAILY, Disp: 90 tablet, Rfl: 1 .  Magnesium-Zinc (MAGNESIUM-CHELATED ZINC PO), Take 1 tablet by mouth daily. , Disp: , Rfl:  .  sodium chloride (AYR) 0.65 % nasal spray, Place 1 spray into the nose as needed for congestion., Disp: , Rfl:  .  DULoxetine (CYMBALTA) 20 MG capsule, Take 1 capsule (20 mg total) by mouth daily., Disp: 30 capsule, Rfl: 3  EXAM:  Vitals:   08/11/18 7628  BP: 120/60  Pulse: 65  Temp: (!) 97.5 F (36.4 C)  SpO2: 98%    Body mass index is 23.73 kg/m.  GENERAL: vitals reviewed and listed above, alert, oriented, appears well hydrated and in no acute distress  HEENT: atraumatic, conjunttiva clear, no obvious abnormalities on inspection of external nose and ears  NECK: no obvious masses on inspection  LUNGS: clear to auscultation bilaterally, no wheezes, rales or rhonchi, good air movement  CV: HRRR, no peripheral edema  Abdomen: Bowel  sounds positive, soft, nontender to palpation  MS: moves all extremities without noticeable abnormality  PSYCH: pleasant and cooperative, no obvious depression or anxiety  ASSESSMENT AND PLAN:  Discussed the following assessment and plan:  Depression, recurrent (HCC)  Need for immunization against influenza - Plan: Flu vaccine HIGH DOSE PF (Fluzone High dose)  Diarrhea, unspecified type  Hypothyroidism, unspecified type  Female bladder prolapse  -Sounds like she may have a mild gastroenteritis, seems to be resolving.  Opted for no dairy for 1 week, plenty of fluids and Imodium if needed.  Also advised return precautions if worsening or not resolving. -For the mood, we opted to give Cymbalta a try again and take with food initially.  Did advised that she wait to her gastrointestinal symptoms have resolved.  Follow-up as needed. -Discussed her bladder prolapse issue, she is seeing GYN and urology for this.  Given she is not having any pain nor recurrent infections, advise she discuss risk versus benefits at length before pursuing surgery.  However, she is quite annoyed with this situation. -She really wants her flu shot today as well before traveling.  Feels well enough to get it she feels. -Follow-up in December for annual wellness visit and physical, labs then -Patient advised to return or notify a doctor immediately if symptoms worsen or persist or new concerns arise.  Patient Instructions  BEFORE YOU LEAVE: -flu shot -follow up: AWV w/ [name] and CPE with [name] in December after 12/19  Imodium as needed for diarrhea. drink plenty of water. No dairy for 1 week.  I hope you are feeling better soon! Seek care promptly if your symptoms worsen, new concerns arise or you are not improving with treatment.  Restart the cymbalta once feeling better and take with food.   Lucretia Kern, DO

## 2018-08-11 ENCOUNTER — Ambulatory Visit (INDEPENDENT_AMBULATORY_CARE_PROVIDER_SITE_OTHER): Payer: Medicare Other | Admitting: Family Medicine

## 2018-08-11 ENCOUNTER — Encounter: Payer: Self-pay | Admitting: Family Medicine

## 2018-08-11 VITALS — BP 120/60 | HR 65 | Temp 97.5°F | Ht 60.0 in | Wt 121.5 lb

## 2018-08-11 DIAGNOSIS — F339 Major depressive disorder, recurrent, unspecified: Secondary | ICD-10-CM | POA: Diagnosis not present

## 2018-08-11 DIAGNOSIS — E039 Hypothyroidism, unspecified: Secondary | ICD-10-CM

## 2018-08-11 DIAGNOSIS — R197 Diarrhea, unspecified: Secondary | ICD-10-CM | POA: Diagnosis not present

## 2018-08-11 DIAGNOSIS — N8110 Cystocele, unspecified: Secondary | ICD-10-CM

## 2018-08-11 DIAGNOSIS — Z23 Encounter for immunization: Secondary | ICD-10-CM

## 2018-08-11 MED ORDER — ACETAMINOPHEN ER 650 MG PO TBCR: 650.0000 mg | EXTENDED_RELEASE_TABLET | ORAL | 0 refills | Status: AC | PRN

## 2018-08-11 MED ORDER — DULOXETINE HCL 20 MG PO CPEP: 20.0000 mg | ORAL_CAPSULE | Freq: Every day | ORAL | 3 refills | Status: DC

## 2018-08-11 NOTE — Patient Instructions (Addendum)
BEFORE YOU LEAVE: -flu shot -follow up: AWV w/ [name] and CPE with [name] in December after 12/19  Imodium as needed for diarrhea. drink plenty of water. No dairy for 1 week.  I hope you are feeling better soon! Seek care promptly if your symptoms worsen, new concerns arise or you are not improving with treatment.  Restart the cymbalta once feeling better and take with food.

## 2018-11-14 ENCOUNTER — Ambulatory Visit: Payer: Medicare Other

## 2018-11-27 NOTE — Progress Notes (Signed)
Medicare Annual Preventive Care Visit  (initial annual wellness or annual wellness exam)  Concerns and/or follow up today:  Kimberly Maynard is a pleasant 85 y.o. here for follow up. Chronic medical problems summarized below were reviewed for changes and stability and were updated as needed below. These issues and their treatment remain stable for the most part.  Reports is doing well. No complaints. Sees opthomology, dermatology and audiology - see scanned documentation. Denies CP, SOB, DOE, treatment intolerance or new symptoms. Due for labs  Hypothyroid: -on levothyroxine 70mcg  Chronic pain/Depression: -chronic pain from OA(knees, hands, back, shoulders) -started cymbalta 11/25/17 with tylenol and topical capsaicin/menthol for pain and depression -She and her daughter both reported that she was doing much better on the Cymbalta -stays active, likes to garden  Difficulty Hearing: -sees audiologist  Allergic rhinitis: -uses allegra and flonase prn -goes outside a lot and is worse after being outside -nasal congesiton - clear, sneezing, itchy eyes  HLD: -denies hx statin intol  Bladder prolapse: -Seeing urologist, she tried a pessary but it fell out -She now is considering a bladder sling with the urologist as this is quite a nuisance to her -Does not have recurrent UTIs or pain with the situation   See HM section in Epic for other details of completed HM. See scanned documentation under Media Tab for further documentation HPI, health risk assessment. See Media Tab and Care Teams sections in Epic for other providers.  ROS: negative for report of fevers, unintentional weight loss, vision changes, vision loss, hearing loss or change, chest pain, sob, hemoptysis, melena, hematochezia, hematuria, genital discharge or lesions, falls, bleeding or bruising, loc, thoughts of suicide or self harm, memory loss  1.) Patient-completed health risk assessment  - completed and  reviewed, see scanned documentation  2.) Review of Medical History: -PMH, PSH, Family History and current specialty and care providers reviewed and updated and listed below  - see scanned in document in chart and below  Past Medical History:  Diagnosis Date  . ANEMIA 01/15/2008   resolved  . ANEURYSM NOS 08/30/2006   offered imaging to assess stability, no guidelines for this, she declined  . DEPRESSION 05/05/2007  . FASTING HYPERGLYCEMIA 05/05/2007  . HYPERLIPIDEMIA 01/15/2008  . HYPOTHYROIDISM 08/30/2006  . OSTEOPENIA 08/30/2006  . 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 Skin cancer   . Splenic artery aneurysm - last CT 2012 stable 08/16/2014    Past Surgical History:  Procedure Laterality Date  . ABDOMINAL HYSTERECTOMY    . OOPHORECTOMY      Social History   Socioeconomic History  . Marital status: Widowed    Spouse name: Not on file  . Number of children: Not on file  . Years of education: Not on file  . Highest education level: Not on file  Occupational History  . Not on file  Social Needs  . Financial resource strain: Not on file  . Food insecurity:    Worry: Not on file    Inability: Not on file  . Transportation needs:    Medical: Not on file    Non-medical: Not on file  Tobacco Use  . Smoking status: Never Smoker  . Smokeless tobacco: Never Used  Substance and Sexual Activity  . Alcohol use: No  . Drug use: No  . Sexual activity: Not on file  Lifestyle  . Physical activity:    Days per week: Not on file    Minutes per session: Not  on file  . Stress: Not on file  Relationships  . Social connections:    Talks on phone: Not on file    Gets together: Not on file    Attends religious service: Not on file    Active member of club or organization: Not on file    Attends meetings of clubs or organizations: Not on file    Relationship status: Not on file  . Intimate partner violence:    Fear of current or ex partner: Not on file     Emotionally abused: Not on file    Physically abused: Not on file    Forced sexual activity: Not on file  Other Topics Concern  . Not on file  Social History Narrative   Work or School: not work      Home Situation: living with daughter      Spiritual Beliefs: none      Lifestyle: works in the yard - no regular exercise; diet is good             Family History  Problem Relation Age of Onset  . Diabetes Father   . Diabetes Sister   . Cancer Brother        pancreatic, prostate  . Diabetes Brother     Current Outpatient Medications on File Prior to Visit  Medication Sig Dispense Refill  . acetaminophen (TYLENOL) 650 MG CR tablet Take 1 tablet (650 mg total) by mouth as needed for pain. 30 tablet 0  . DULoxetine (CYMBALTA) 20 MG capsule Take 1 capsule (20 mg total) by mouth daily. 30 capsule 3  . fluticasone (FLONASE) 50 MCG/ACT nasal spray Place 2 sprays into both nostrils daily. 16 g 6  . levothyroxine (SYNTHROID, LEVOTHROID) 75 MCG tablet TAKE 1 TABLET BY MOUTH ONCE DAILY 90 tablet 1  . Magnesium-Zinc (MAGNESIUM-CHELATED ZINC PO) Take 1 tablet by mouth daily.     . sodium chloride (AYR) 0.65 % nasal spray Place 1 spray into the nose as needed for congestion.     No current facility-administered medications on file prior to visit.      3.) Review of functional ability and level of safety:  Any difficulty hearing?  See scanned documentation  History of falling?  See scanned documentation  Any trouble with IADLs - using a phone, using transportation, grocery shopping, preparing meals, doing housework, doing laundry, taking medications and managing money?  See scanned documentation  Advance Directives? Done per daughter.  See summary of recommendations in Patient Instructions below.  4.) Physical Exam Vitals:   12/01/18 0936  BP: (!) 120/58  Pulse: 72  Temp: (!) 97.5 F (36.4 C)   Estimated body mass index is 24.65 kg/m as calculated from the  following:   Height as of this encounter: 4' 10.5" (1.486 m).   Weight as of this encounter: 120 lb (54.4 kg).  EKG (optional): deferred  General: alert, appear well hydrated and in no acute distress  HEENT: visual acuity - declined this exam - doe snot have glasses with her  CV: HRRR  Lungs: CTA bilaterally  Psych: pleasant and cooperative, no obvious depression or anxiety  Cognitive function grossly intact  See patient instructions for recommendations.  Education and counseling regarding the above review of health provided with a plan for the following: -see scanned patient completed form for further details -fall prevention strategies discussed  -healthy lifestyle discussed -importance and resources for completing advanced directives discussed -see patient instructions below for any other recommendations provided  4)The following written screening schedule of preventive measures were reviewed with assessment and plan made per below, orders and patient instructions:      AAA screening done if applicable     Alcohol screening done     Obesity Screening and counseling done     STI screening (Hep C if born [birth year]) offered and per pt wishes     Tobacco Screening done done       Pneumococcal (PPSV23 -one dose after 64, one before if risk factors), influenza yearly and hepatitis B vaccines (if high risk - end stage renal disease, IV drugs, homosexual men, live in home for mentally retarded, hemophilia receiving factors) ASSESSMENT/PLAN: done if applicable      Screening mammograph (yearly if >40) ASSESSMENT/PLAN:  n/a declined      Screening Pap smear/pelvic exam (q2 years) ASSESSMENT/PLAN: n/a, declined      Colorectal cancer screening (FOBT yearly or flex sig q4y or colonoscopy q10y or barium enema q4y) ASSESSMENT/PLAN: utd, n/a      Diabetes outpatient self-management training services ASSESSMENT/PLAN: utd or done      Bone mass measurements(covered q2y if indicated  - estrogen def, osteoporosis, hyperparathyroid, vertebral abnormalities, osteoporosis or steroids) ASSESSMENT/PLAN: utd or discussed and ordered per pt wishes - last done 2012, declined further, on boniva in the past      Screening for glaucoma(q1y if high risk - diabetes, FH, AA and > 50 or hispanic and > 65) ASSESSMENT/PLAN: utd or advised      Medical nutritional therapy for individuals with diabetes or renal disease ASSESSMENT/PLAN: see orders      Cardiovascular screening blood tests (lipids q5y) ASSESSMENT/PLAN: see orders and labs      Diabetes screening tests ASSESSMENT/PLAN: see orders and labs   7.) Summary:  Medicare annual wellness visit, subsequent - Plan: Hemoglobin A1c -risk factors and conditions per above assessment were discussed and treatment, recommendations and referrals were offered per documentation above and orders and patient instructions.  Hypothyroidism, unspecified type - Plan: TSH  Hyperlipidemia, unspecified hyperlipidemia type - Plan: Lipid panel     Patient Instructions   BEFORE YOU LEAVE: -labs -add phq9 -follow up: 4-6 months   Ms. Prevo , Thank you for taking time to come for your Medicare Wellness Visit. I appreciate your ongoing commitment to your health goals. Please review the following plan we discussed and let me know if I can assist you in the future.   These are the goals we discussed: Consider Shinrix Vaccine for shingles Eat a healthy diet Get regular exercise Continue current medications   This is a list of the screening recommended for you and due dates:  Health Maintenance  Topic Date Due  . Tetanus Vaccine  03/11/2025  . Flu Shot  Completed  . DEXA scan (bone density measurement)  Completed    Lucretia Kern, DO

## 2018-12-01 ENCOUNTER — Encounter: Payer: Self-pay | Admitting: Family Medicine

## 2018-12-01 ENCOUNTER — Ambulatory Visit: Payer: Medicare Other

## 2018-12-01 ENCOUNTER — Ambulatory Visit (INDEPENDENT_AMBULATORY_CARE_PROVIDER_SITE_OTHER): Payer: Medicare Other | Admitting: Family Medicine

## 2018-12-01 VITALS — BP 120/58 | HR 72 | Temp 97.5°F | Ht 58.5 in | Wt 120.0 lb

## 2018-12-01 DIAGNOSIS — E785 Hyperlipidemia, unspecified: Secondary | ICD-10-CM

## 2018-12-01 DIAGNOSIS — Z0000 Encounter for general adult medical examination without abnormal findings: Secondary | ICD-10-CM

## 2018-12-01 DIAGNOSIS — E039 Hypothyroidism, unspecified: Secondary | ICD-10-CM | POA: Diagnosis not present

## 2018-12-01 LAB — LIPID PANEL
CHOLESTEROL: 214 mg/dL — AB (ref 0–200)
HDL: 51.6 mg/dL (ref >39.00)
LDL Cholesterol: 141 mg/dL — ABNORMAL HIGH (ref 0–99)
NonHDL: 162.65
TRIGLYCERIDES: 107 mg/dL (ref 0.0–149.0)
Total CHOL/HDL Ratio: 4
VLDL: 21.4 mg/dL (ref 0.0–40.0)

## 2018-12-01 LAB — HEMOGLOBIN A1C: HEMOGLOBIN A1C: 5.2 % (ref 4.6–6.5)

## 2018-12-01 LAB — TSH: TSH: 0.39 u[IU]/mL (ref 0.35–4.50)

## 2018-12-01 NOTE — Patient Instructions (Signed)
BEFORE YOU LEAVE: -labs -add phq9 -follow up: 4-6 months   Kimberly Maynard , Thank you for taking time to come for your Medicare Wellness Visit. I appreciate your ongoing commitment to your health goals. Please review the following plan we discussed and let me know if I can assist you in the future.   These are the goals we discussed: Consider Shinrix Vaccine for shingles Eat a healthy diet Get regular exercise Continue current medications   This is a list of the screening recommended for you and due dates:  Health Maintenance  Topic Date Due  . Tetanus Vaccine  03/11/2025  . Flu Shot  Completed  . DEXA scan (bone density measurement)  Completed

## 2018-12-02 ENCOUNTER — Encounter: Payer: Medicare Other | Admitting: Family Medicine

## 2018-12-02 ENCOUNTER — Telehealth: Payer: Self-pay | Admitting: *Deleted

## 2018-12-02 MED ORDER — LEVOTHYROXINE SODIUM 75 MCG PO TABS: 75.0000 ug | ORAL_TABLET | Freq: Every day | ORAL | 1 refills | Status: DC

## 2018-12-02 NOTE — Telephone Encounter (Signed)
See results note. 

## 2018-12-02 NOTE — Telephone Encounter (Signed)
Copied from [place] [id number]. Topic: General - Other >> Dec 02, 2018  1:53 PM Leward Quan A wrote: Reason for CRM: Patient called to say that she had a missed call nothing noted but she thought it may had been her lab results. Please advise Ph# [phone]

## 2018-12-02 NOTE — Addendum Note (Signed)
Addended by: Agnes Lawrence on: 12/02/2018 04:29 PM   Modules accepted: Orders

## 2018-12-08 ENCOUNTER — Telehealth: Payer: Self-pay

## 2018-12-08 NOTE — Telephone Encounter (Signed)
Pt brought in bill from collection agency regarding DOS 11/13/17. This has been brought to the attention of the billing department multiple times for incorrect coding. As of 12/05/2018 billing has not resolved this issue. We have requested they pull back the collection agency charges pending review. The pt should not owe anything on this DOS.   Attempted to reach pt to advise. Phone rings with no answer and no vm.

## 2018-12-11 ENCOUNTER — Ambulatory Visit: Payer: Medicare Other

## 2018-12-12 NOTE — Telephone Encounter (Signed)
Email sent to billing office to see what the hold up is, they are researching.

## 2018-12-16 NOTE — Telephone Encounter (Signed)
Another email sent to billing for update. Waiting on reply.

## 2018-12-18 ENCOUNTER — Encounter: Payer: Self-pay | Admitting: Family Medicine

## 2018-12-18 ENCOUNTER — Ambulatory Visit (INDEPENDENT_AMBULATORY_CARE_PROVIDER_SITE_OTHER): Payer: Medicare Other | Admitting: Family Medicine

## 2018-12-18 VITALS — BP 120/50 | HR 73 | Temp 98.2°F | Ht 58.5 in | Wt 120.5 lb

## 2018-12-18 DIAGNOSIS — J989 Respiratory disorder, unspecified: Secondary | ICD-10-CM | POA: Diagnosis not present

## 2018-12-18 DIAGNOSIS — R059 Cough, unspecified: Secondary | ICD-10-CM

## 2018-12-18 DIAGNOSIS — R05 Cough: Secondary | ICD-10-CM | POA: Diagnosis not present

## 2018-12-18 MED ORDER — BENZONATATE 100 MG PO CAPS: 100.0000 mg | ORAL_CAPSULE | Freq: Three times a day (TID) | ORAL | 0 refills | Status: DC | PRN

## 2018-12-18 NOTE — Patient Instructions (Addendum)
BEFORE YOU LEAVE: -xray -follow up: as needed  Tessalon for cough per instructions - keep away from kids.  Get the xray.  I hope you are feeling better soon! Seek care promptly if your symptoms worsen, new concerns arise or you are not improving with treatment.   INSTRUCTIONS FOR UPPER RESPIRATORY INFECTION:  -plenty of rest and fluids  -nasal saline wash 2-3 times daily (use prepackaged nasal saline or bottled/distilled water if making your own)   -in the winter time, using a humidifier at night is helpful (please follow cleaning instructions)  -if you are taking a cough medication - use only as directed, may also try a teaspoon of honey to coat the throat and throat lozenges. If given a cough medication with codeine or hydrocodone or other narcotic please be advised that this contains a strong and  potentially addicting medication. Please follow instructions carefully, take as little as possible and only use AS NEEDED for severe cough. Discuss potential side effects with your pharmacy. Please do not drive or operate machinery while taking these types of medications. Please do not take other sedating medications, drugs or alcohol while taking this medication without discussing with your doctor.  -for sore throat, salt water gargles can help  -follow up if you have fevers, facial pain, tooth pain, difficulty breathing or are worsening or symptoms persist longer then expected  Upper Respiratory Infection, Adult An upper respiratory infection (URI) is also known as the common cold. It is often caused by a type of germ (virus). Colds are easily spread (contagious). You can pass it to others by kissing, coughing, sneezing, or drinking out of the same glass. Usually, you get better in 1 to 3  weeks.  However, the cough can last for even longer. HOME CARE   Only take medicine as told by your doctor. Follow instructions provided above.  Drink enough water and fluids to keep your pee (urine)  clear or pale yellow.  Get plenty of rest.  Return to work when your temperature is < 100 for 24 hours or as told by your doctor. You may use a face mask and wash your hands to stop your cold from spreading. GET HELP RIGHT AWAY IF:   After the first few days, you feel you are getting worse.  You have questions about your medicine.  You have chills, shortness of breath, or red spit (mucus).  You have pain in the face for more then 1-2 days, especially when you bend forward.  You have a fever, puffy (swollen) neck, pain when you swallow, or white spots in the back of your throat.  You have a bad headache, ear pain, sinus pain, or chest pain.  You have a high-pitched whistling sound when you breathe in and out (wheezing).  You cough up blood.  You have sore muscles or a stiff neck. MAKE SURE YOU:   Understand these instructions.  Will watch your condition.  Will get help right away if you are not doing well or get worse. Document Released: 04/30/2008 Document Revised: 02/04/2012 Document Reviewed: 02/17/2014 [facility] Patient Information 2015 [place], [place]. This information is not intended to replace advice given to you by your health care provider. Make sure you discuss any questions you have with your health care provider.

## 2018-12-18 NOTE — Progress Notes (Signed)
HPI:  Using dictation device. Unfortunately this device frequently misinterprets words/phrases.  Acute visit for cough: -started 3 days ago -symptoms include nasal congestion, pnd, cough -no fevers, SOB, wheezing, body aches -has not tried anything for the cough  ROS: See pertinent positives and negatives per HPI.  Past Medical History:  Diagnosis Date  . ANEMIA 01/15/2008   resolved  . ANEURYSM NOS 08/30/2006   offered imaging to assess stability, no guidelines for this, she declined  . DEPRESSION 05/05/2007  . FASTING HYPERGLYCEMIA 05/05/2007  . HYPERLIPIDEMIA 01/15/2008  . HYPOTHYROIDISM 08/30/2006  . OSTEOPENIA 08/30/2006  . 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 Skin cancer   . Splenic artery aneurysm - last CT 2012 stable 08/16/2014    Past Surgical History:  Procedure Laterality Date  . ABDOMINAL HYSTERECTOMY    . OOPHORECTOMY      Family History  Problem Relation Age of Onset  . Diabetes Father   . Diabetes Sister   . Cancer Brother        pancreatic, prostate  . Diabetes Brother     SOCIAL HX: se ehpi   Current Outpatient Medications:  .  acetaminophen (TYLENOL) 650 MG CR tablet, Take 1 tablet (650 mg total) by mouth as needed for pain., Disp: 30 tablet, Rfl: 0 .  DULoxetine (CYMBALTA) 20 MG capsule, Take 1 capsule (20 mg total) by mouth daily., Disp: 30 capsule, Rfl: 3 .  fluticasone (FLONASE) 50 MCG/ACT nasal spray, Place 2 sprays into both nostrils daily., Disp: 16 g, Rfl: 6 .  levothyroxine (SYNTHROID, LEVOTHROID) 75 MCG tablet, Take 1 tablet (75 mcg total) by mouth daily., Disp: 90 tablet, Rfl: 1 .  Magnesium-Zinc (MAGNESIUM-CHELATED ZINC PO), Take 1 tablet by mouth daily. , Disp: , Rfl:  .  sodium chloride (AYR) 0.65 % nasal spray, Place 1 spray into the nose as needed for congestion., Disp: , Rfl:  .  benzonatate (TESSALON PERLES) 100 MG capsule, Take 1 capsule (100 mg total) by mouth 3 (three) times daily as needed., Disp:  20 capsule, Rfl: 0  EXAM:  Vitals:   12/18/18 1531  BP: (!) 120/50  Pulse: 73  Temp: 98.2 F (36.8 C)  SpO2: 96%    Body mass index is 24.76 kg/m.  GENERAL: vitals reviewed and listed above, alert, oriented, appears well hydrated and in no acute distress  HEENT: atraumatic, conjunttiva clear, no obvious abnormalities on inspection of external nose and ears, normal appearance of ear canals and TMs, clear nasal congestion, mild post oropharyngeal erythema with PND, no tonsillar edema or exudate, no sinus TTP  NECK: no obvious masses on inspection  LUNGS: clear to auscultation bilaterally, no wheezes, rales or rhonchi, good air movement  CV: HRRR, no peripheral edema  MS: moves all extremities without noticeable abnormality   PSYCH: pleasant and cooperative, no obvious depression or anxiety  ASSESSMENT AND PLAN:  Discussed the following assessment and plan:  Cough  Respiratory illness  -check cxr given age and severity cough, suspect vuri -tessalon for cough along with symptomatic otc recs per below -opted against flu testing after discussing with pt and daughter given duration, no fever, no body aches -pt advised to return or seek care promptly if worsening, difficulty breathing, etc -Patient advised to return or notify a doctor immediately if symptoms worsen or persist or new concerns arise.  Patient Instructions  BEFORE YOU LEAVE: -xray -follow up: as needed  Tessalon for cough per instructions - keep away from kids.  Get the xray.  I hope you are feeling better soon! Seek care promptly if your symptoms worsen, new concerns arise or you are not improving with treatment.   INSTRUCTIONS FOR UPPER RESPIRATORY INFECTION:  -plenty of rest and fluids  -nasal saline wash 2-3 times daily (use prepackaged nasal saline or bottled/distilled water if making your own)   -in the winter time, using a humidifier at night is helpful (please follow cleaning  instructions)  -if you are taking a cough medication - use only as directed, may also try a teaspoon of honey to coat the throat and throat lozenges. If given a cough medication with codeine or hydrocodone or other narcotic please be advised that this contains a strong and  potentially addicting medication. Please follow instructions carefully, take as little as possible and only use AS NEEDED for severe cough. Discuss potential side effects with your pharmacy. Please do not drive or operate machinery while taking these types of medications. Please do not take other sedating medications, drugs or alcohol while taking this medication without discussing with your doctor.  -for sore throat, salt water gargles can help  -follow up if you have fevers, facial pain, tooth pain, difficulty breathing or are worsening or symptoms persist longer then expected  Upper Respiratory Infection, Adult An upper respiratory infection (URI) is also known as the common cold. It is often caused by a type of germ (virus). Colds are easily spread (contagious). You can pass it to others by kissing, coughing, sneezing, or drinking out of the same glass. Usually, you get better in 1 to 3  weeks.  However, the cough can last for even longer. HOME CARE   Only take medicine as told by your doctor. Follow instructions provided above.  Drink enough water and fluids to keep your pee (urine) clear or pale yellow.  Get plenty of rest.  Return to work when your temperature is < 100 for 24 hours or as told by your doctor. You may use a face mask and wash your hands to stop your cold from spreading. GET HELP RIGHT AWAY IF:   After the first few days, you feel you are getting worse.  You have questions about your medicine.  You have chills, shortness of breath, or red spit (mucus).  You have pain in the face for more then 1-2 days, especially when you bend forward.  You have a fever, puffy (swollen) neck, pain when you  swallow, or white spots in the back of your throat.  You have a bad headache, ear pain, sinus pain, or chest pain.  You have a high-pitched whistling sound when you breathe in and out (wheezing).  You cough up blood.  You have sore muscles or a stiff neck. MAKE SURE YOU:   Understand these instructions.  Will watch your condition.  Will get help right away if you are not doing well or get worse. Document Released: 04/30/2008 Document Revised: 02/04/2012 Document Reviewed: 02/17/2014 [facility] Patient Information 2015 [place], [place]. This information is not intended to replace advice given to you by your health care provider. Make sure you discuss any questions you have with your health care provider.      Lucretia Kern, DO

## 2018-12-19 ENCOUNTER — Ambulatory Visit (INDEPENDENT_AMBULATORY_CARE_PROVIDER_SITE_OTHER): Payer: Medicare Other

## 2018-12-19 DIAGNOSIS — R05 Cough: Secondary | ICD-10-CM | POA: Diagnosis not present

## 2018-12-19 DIAGNOSIS — R0989 Other specified symptoms and signs involving the circulatory and respiratory systems: Secondary | ICD-10-CM | POA: Diagnosis not present

## 2018-12-19 DIAGNOSIS — R059 Cough, unspecified: Secondary | ICD-10-CM

## 2018-12-28 ENCOUNTER — Other Ambulatory Visit: Payer: Self-pay | Admitting: Family Medicine

## 2018-12-29 DIAGNOSIS — L570 Actinic keratosis: Secondary | ICD-10-CM | POA: Diagnosis not present

## 2018-12-29 DIAGNOSIS — D225 Melanocytic nevi of trunk: Secondary | ICD-10-CM | POA: Diagnosis not present

## 2018-12-29 DIAGNOSIS — I781 Nevus, non-neoplastic: Secondary | ICD-10-CM | POA: Diagnosis not present

## 2018-12-29 DIAGNOSIS — L821 Other seborrheic keratosis: Secondary | ICD-10-CM | POA: Diagnosis not present

## 2019-01-07 NOTE — Telephone Encounter (Signed)
Per billing we are still waiting on [employer] to complete the appeal process for this DOS claim.

## 2019-04-06 ENCOUNTER — Ambulatory Visit: Payer: Medicare Other | Admitting: Family Medicine

## 2019-04-13 ENCOUNTER — Encounter: Payer: Medicare Other | Admitting: Family Medicine

## 2019-06-29 ENCOUNTER — Other Ambulatory Visit: Payer: Self-pay

## 2019-06-29 ENCOUNTER — Encounter: Payer: Self-pay | Admitting: Family Medicine

## 2019-06-29 ENCOUNTER — Ambulatory Visit (INDEPENDENT_AMBULATORY_CARE_PROVIDER_SITE_OTHER): Payer: Medicare Other | Admitting: Family Medicine

## 2019-06-29 DIAGNOSIS — M159 Polyosteoarthritis, unspecified: Secondary | ICD-10-CM

## 2019-06-29 DIAGNOSIS — E785 Hyperlipidemia, unspecified: Secondary | ICD-10-CM

## 2019-06-29 DIAGNOSIS — F419 Anxiety disorder, unspecified: Secondary | ICD-10-CM

## 2019-06-29 DIAGNOSIS — E039 Hypothyroidism, unspecified: Secondary | ICD-10-CM

## 2019-06-29 MED ORDER — DULOXETINE HCL 20 MG PO CPEP: 20.0000 mg | ORAL_CAPSULE | Freq: Every day | ORAL | 1 refills | Status: AC

## 2019-06-29 MED ORDER — LEVOTHYROXINE SODIUM 75 MCG PO TABS: 75.0000 ug | ORAL_TABLET | Freq: Every day | ORAL | 1 refills | Status: DC

## 2019-06-29 NOTE — Progress Notes (Signed)
Virtual Visit via Telephone Note  I connected with Gianina Olinde  on 06/29/19 at 12:00 PM EDT by telephone and verified that I am speaking with the correct person using two identifiers.   I discussed the limitations, risks, security and privacy concerns of performing an evaluation and management service by telephone and the availability of in person appointments. I also discussed with the patient that there may be a patient responsible charge related to this service. The patient expressed understanding and agreed to proceed.  Location patient: home Location provider: work office Participants present for the call: patient, provider Patient did not have a visit in the prior 7 days to address this/these issue(s).   History of Present Illness: Difficult for her to get to doctor because depends on ride from daughter. Legs are sore every once in awhile. Wants to go but can't go sometimes. Works in yard, walks in stores, stairs sometimes. Has grandkids there so is doing ballet with her.   Energy level is ok. Sleeping ok.   Hypothyroid: synthroid 70mcg daily Osteopenia:exercise daily.  HL: diet controlled Osteoarthritis:doing ok with topical arthritis creams. Staying active. Seasonal allergies: flonase helps with bad sinus problems; well controlled.  Anxiety better with cymbalta - takes at night. This has really helped.    Observations/Objective: Patient sounds cheerful and well on the phone. I do not appreciate any SOB. Speech and thought processing are grossly intact.   Assessment and Plan: 1. Hypothyroidism, unspecified type Controlled. Continue current medication. - Comprehensive metabolic panel; Future - TSH; Future - levothyroxine (SYNTHROID) 75 MCG tablet; Take 1 tablet (75 mcg total) by mouth daily.  Dispense: 90 tablet; Refill: 1  2. Osteoarthritis of multiple joints, unspecified osteoarthritis type Stable. Does well with topical arthritis treatment.   3. Hyperlipidemia,  unspecified hyperlipidemia type Diet controlled.  - Comprehensive metabolic panel; Future - Lipid panel; Future  4. Anxiety cymbalta has helped with this. Feels that anxiety is well controlled.  - DULoxetine (CYMBALTA) 20 MG capsule; Take 1 capsule (20 mg total) by mouth daily.  Dispense: 90 capsule; Refill: 1   Follow Up Instructions: Return AWV with HK in fall; then in office exam in 6 mo time..    I did not refer this patient for an OV in the next 24 hours for this/these issue(s).  I discussed the assessment and treatment plan with the patient. The patient was provided an opportunity to ask questions and all were answered. The patient agreed with the plan and demonstrated an understanding of the instructions.   The patient was advised to call back or seek an in-person evaluation if the symptoms worsen or if the condition fails to improve as anticipated.  I provided 15 minutes of non-face-to-face time during this encounter.   Micheline Rough, MD

## 2019-06-30 ENCOUNTER — Telehealth: Payer: Self-pay | Admitting: *Deleted

## 2019-06-30 NOTE — Telephone Encounter (Signed)
I called the pts daughter April and informed her the pt had an AWV on 12/01/18 and is not due at this time.  April stated she will check with the pt and call back for a 6 month follow up visit.

## 2019-06-30 NOTE — Telephone Encounter (Signed)
-----   Message from Caren Macadam, MD sent at 06/29/2019  5:56 PM EDT ----- AWV with HK in September-October please; then in office exam in 6 months time.

## 2019-07-08 DIAGNOSIS — L570 Actinic keratosis: Secondary | ICD-10-CM | POA: Diagnosis not present

## 2019-07-08 DIAGNOSIS — D1801 Hemangioma of skin and subcutaneous tissue: Secondary | ICD-10-CM | POA: Diagnosis not present

## 2019-07-08 DIAGNOSIS — D225 Melanocytic nevi of trunk: Secondary | ICD-10-CM | POA: Diagnosis not present

## 2019-07-08 DIAGNOSIS — L821 Other seborrheic keratosis: Secondary | ICD-10-CM | POA: Diagnosis not present

## 2019-07-24 ENCOUNTER — Ambulatory Visit
Admission: RE | Admit: 2019-07-24 | Discharge: 2019-07-24 | Disposition: A | Discharge status: Home / Self Care | Payer: Medicare Other | Source: Ambulatory Visit | Attending: Internal Medicine | Admitting: Internal Medicine

## 2019-07-24 ENCOUNTER — Other Ambulatory Visit: Payer: Self-pay | Admitting: Internal Medicine

## 2019-07-24 ENCOUNTER — Other Ambulatory Visit: Payer: Self-pay

## 2019-07-24 DIAGNOSIS — R05 Cough: Secondary | ICD-10-CM

## 2019-07-24 DIAGNOSIS — E039 Hypothyroidism, unspecified: Secondary | ICD-10-CM | POA: Diagnosis not present

## 2019-07-24 DIAGNOSIS — R058 Other specified cough: Secondary | ICD-10-CM

## 2019-08-24 DIAGNOSIS — Z23 Encounter for immunization: Secondary | ICD-10-CM | POA: Diagnosis not present

## 2019-08-24 DIAGNOSIS — I119 Hypertensive heart disease without heart failure: Secondary | ICD-10-CM | POA: Diagnosis not present

## 2019-08-25 DIAGNOSIS — H524 Presbyopia: Secondary | ICD-10-CM | POA: Diagnosis not present

## 2019-08-25 DIAGNOSIS — H353122 Nonexudative age-related macular degeneration, left eye, intermediate dry stage: Secondary | ICD-10-CM | POA: Diagnosis not present

## 2019-08-25 DIAGNOSIS — H2521 Age-related cataract, morgagnian type, right eye: Secondary | ICD-10-CM | POA: Diagnosis not present

## 2019-09-28 DIAGNOSIS — I119 Hypertensive heart disease without heart failure: Secondary | ICD-10-CM | POA: Diagnosis not present

## 2019-11-30 DIAGNOSIS — I119 Hypertensive heart disease without heart failure: Secondary | ICD-10-CM | POA: Diagnosis not present

## 2019-11-30 DIAGNOSIS — E039 Hypothyroidism, unspecified: Secondary | ICD-10-CM | POA: Diagnosis not present

## 2019-11-30 DIAGNOSIS — R05 Cough: Secondary | ICD-10-CM | POA: Diagnosis not present

## 2019-11-30 DIAGNOSIS — R252 Cramp and spasm: Secondary | ICD-10-CM | POA: Diagnosis not present

## 2019-12-01 ENCOUNTER — Other Ambulatory Visit: Payer: Self-pay | Admitting: Internal Medicine

## 2019-12-01 DIAGNOSIS — R109 Unspecified abdominal pain: Secondary | ICD-10-CM

## 2019-12-01 DIAGNOSIS — R1111 Vomiting without nausea: Secondary | ICD-10-CM

## 2019-12-02 ENCOUNTER — Other Ambulatory Visit: Payer: Self-pay | Admitting: Internal Medicine

## 2019-12-02 DIAGNOSIS — I10 Essential (primary) hypertension: Secondary | ICD-10-CM

## 2019-12-02 DIAGNOSIS — R0989 Other specified symptoms and signs involving the circulatory and respiratory systems: Secondary | ICD-10-CM

## 2019-12-02 DIAGNOSIS — M79606 Pain in leg, unspecified: Secondary | ICD-10-CM

## 2019-12-10 ENCOUNTER — Ambulatory Visit
Admission: RE | Admit: 2019-12-10 | Discharge: 2019-12-10 | Disposition: A | Discharge status: Home / Self Care | Payer: Medicare Other | Source: Ambulatory Visit | Attending: Internal Medicine | Admitting: Internal Medicine

## 2019-12-10 DIAGNOSIS — R109 Unspecified abdominal pain: Secondary | ICD-10-CM

## 2019-12-10 DIAGNOSIS — R1111 Vomiting without nausea: Secondary | ICD-10-CM

## 2019-12-28 ENCOUNTER — Ambulatory Visit
Admission: RE | Admit: 2019-12-28 | Discharge: 2019-12-28 | Disposition: A | Discharge status: Home / Self Care | Payer: Medicare Other | Source: Ambulatory Visit | Attending: Internal Medicine | Admitting: Internal Medicine

## 2019-12-28 DIAGNOSIS — I10 Essential (primary) hypertension: Secondary | ICD-10-CM

## 2019-12-28 DIAGNOSIS — M79606 Pain in leg, unspecified: Secondary | ICD-10-CM | POA: Diagnosis not present

## 2019-12-28 DIAGNOSIS — R0989 Other specified symptoms and signs involving the circulatory and respiratory systems: Secondary | ICD-10-CM

## 2020-01-06 ENCOUNTER — Other Ambulatory Visit: Payer: Self-pay

## 2020-01-06 NOTE — Patient Outreach (Signed)
[facility]) Care Management  01/06/2020  Kimberly Maynard 02/06/1928 [id number]  Medication Adherence call to Kimberly Maynard [place] Identifiers Verify spoke with patient she is due on Telmisartan/Hctz 80/12.5 mg,patient explain she takes 1 tablet daily,patient has 2 more tablets,patient prefers to order it her self. Kimberly Maynard is showing due under [place].   [facility] Direct Dial [phone]  Fax [phone] [email or web address]

## 2020-01-24 ENCOUNTER — Other Ambulatory Visit: Payer: Self-pay | Admitting: Family Medicine

## 2020-01-24 DIAGNOSIS — E039 Hypothyroidism, unspecified: Secondary | ICD-10-CM

## 2020-02-12 DIAGNOSIS — H2521 Age-related cataract, morgagnian type, right eye: Secondary | ICD-10-CM | POA: Diagnosis not present

## 2020-02-12 DIAGNOSIS — H353221 Exudative age-related macular degeneration, left eye, with active choroidal neovascularization: Secondary | ICD-10-CM | POA: Diagnosis not present

## 2020-02-12 DIAGNOSIS — H53001 Unspecified amblyopia, right eye: Secondary | ICD-10-CM | POA: Diagnosis not present

## 2020-02-12 DIAGNOSIS — H5000 Unspecified esotropia: Secondary | ICD-10-CM | POA: Diagnosis not present

## 2020-02-15 DIAGNOSIS — H3562 Retinal hemorrhage, left eye: Secondary | ICD-10-CM | POA: Diagnosis not present

## 2020-02-15 DIAGNOSIS — H2521 Age-related cataract, morgagnian type, right eye: Secondary | ICD-10-CM | POA: Diagnosis not present

## 2020-02-15 DIAGNOSIS — H353221 Exudative age-related macular degeneration, left eye, with active choroidal neovascularization: Secondary | ICD-10-CM | POA: Diagnosis not present

## 2020-02-29 ENCOUNTER — Other Ambulatory Visit: Payer: Self-pay | Admitting: Internal Medicine

## 2020-02-29 ENCOUNTER — Ambulatory Visit
Admission: RE | Admit: 2020-02-29 | Discharge: 2020-02-29 | Disposition: A | Discharge status: Home / Self Care | Payer: Medicare Other | Source: Ambulatory Visit | Attending: Internal Medicine | Admitting: Internal Medicine

## 2020-02-29 ENCOUNTER — Other Ambulatory Visit: Payer: Self-pay

## 2020-02-29 DIAGNOSIS — M1712 Unilateral primary osteoarthritis, left knee: Secondary | ICD-10-CM | POA: Diagnosis not present

## 2020-02-29 DIAGNOSIS — I119 Hypertensive heart disease without heart failure: Secondary | ICD-10-CM | POA: Diagnosis not present

## 2020-02-29 DIAGNOSIS — M79662 Pain in left lower leg: Secondary | ICD-10-CM | POA: Diagnosis not present

## 2020-02-29 DIAGNOSIS — R52 Pain, unspecified: Secondary | ICD-10-CM

## 2020-02-29 DIAGNOSIS — E039 Hypothyroidism, unspecified: Secondary | ICD-10-CM | POA: Diagnosis not present

## 2020-03-14 DIAGNOSIS — M25562 Pain in left knee: Secondary | ICD-10-CM | POA: Diagnosis not present

## 2020-03-14 DIAGNOSIS — M7052 Other bursitis of knee, left knee: Secondary | ICD-10-CM | POA: Diagnosis not present

## 2020-03-21 ENCOUNTER — Ambulatory Visit (INDEPENDENT_AMBULATORY_CARE_PROVIDER_SITE_OTHER): Payer: Medicare Other | Admitting: Ophthalmology

## 2020-03-21 ENCOUNTER — Encounter (INDEPENDENT_AMBULATORY_CARE_PROVIDER_SITE_OTHER): Payer: Self-pay | Admitting: Ophthalmology

## 2020-03-21 ENCOUNTER — Other Ambulatory Visit: Payer: Self-pay

## 2020-03-21 DIAGNOSIS — H2521 Age-related cataract, morgagnian type, right eye: Secondary | ICD-10-CM | POA: Diagnosis not present

## 2020-03-21 DIAGNOSIS — H353222 Exudative age-related macular degeneration, left eye, with inactive choroidal neovascularization: Secondary | ICD-10-CM | POA: Insufficient documentation

## 2020-03-21 DIAGNOSIS — H353221 Exudative age-related macular degeneration, left eye, with active choroidal neovascularization: Secondary | ICD-10-CM

## 2020-03-21 DIAGNOSIS — H3562 Retinal hemorrhage, left eye: Secondary | ICD-10-CM | POA: Diagnosis not present

## 2020-03-21 MED ORDER — BEVACIZUMAB CHEMO INJECTION 1.25MG/0.05ML SYRINGE FOR KALEIDOSCOPE
1.2500 mg | INTRAVITREAL | Status: AC | PRN
  Administered 2020-03-21: 14:00:00 1.25 mg via INTRAVITREAL

## 2020-03-21 NOTE — Progress Notes (Signed)
03/21/2020     CHIEF COMPLAINT Patient presents for Retina Follow Up   HISTORY OF PRESENT ILLNESS: Kimberly Maynard is a 86 y.o. female who presents to the clinic today for:   HPI    Retina Follow Up    Patient presents with  Wet AMD.  In left eye.  This started 5 weeks ago.  Severity is mild.  Duration of 5 weeks.  Since onset it is stable.          Comments    5 Week AMD F/U OS, poss Avastin OS  Pt denies noticeable changes to [place] OU since last visit. Pt denies ocular pain, flashes of light, or floaters OU.         Last edited by Rockie Neighbours, [place] on 03/21/2020  1:32 PM. (History)      Referring physician: Caren Macadam, MD [place],  [place] [address]  HISTORICAL INFORMATION:   Selected notes from the MEDICAL RECORD NUMBER    Lab Results  Component Value Date   HGBA1C 5.2 12/01/2018     CURRENT MEDICATIONS: No current outpatient medications on file. (Ophthalmic Drugs)   No current facility-administered medications for this visit. (Ophthalmic Drugs)   Current Outpatient Medications (Other)  Medication Sig  . acetaminophen (TYLENOL) 650 MG CR tablet Take 1 tablet (650 mg total) by mouth as needed for pain.  . benzonatate (TESSALON PERLES) 100 MG capsule Take 1 capsule (100 mg total) by mouth 3 (three) times daily as needed.  . DULoxetine (CYMBALTA) 20 MG capsule Take 1 capsule (20 mg total) by mouth daily.  Arna Medici 75 MCG tablet Take 1 tablet by mouth once daily  . fluticasone (FLONASE) 50 MCG/ACT nasal spray Place 2 sprays into both nostrils daily.  . Magnesium-Zinc (MAGNESIUM-CHELATED ZINC PO) Take 1 tablet by mouth daily.   . sodium chloride (AYR) 0.65 % nasal spray Place 1 spray into the nose as needed for congestion.   No current facility-administered medications for this visit. (Other)      REVIEW OF SYSTEMS:    ALLERGIES Allergies  Allergen Reactions  . Carisoprodol     REACTION: unspecified  . Cyclobenzaprine  Hcl     REACTION: unspecified  . Penicillins     REACTION: itching    PAST MEDICAL HISTORY Past Medical History:  Diagnosis Date  . ANEMIA 01/15/2008   resolved  . ANEURYSM NOS 08/30/2006   offered imaging to assess stability, no guidelines for this, she declined  . DEPRESSION 05/05/2007  . FASTING HYPERGLYCEMIA 05/05/2007  . HYPERLIPIDEMIA 01/15/2008  . HYPOTHYROIDISM 08/30/2006  . OSTEOPENIA 08/30/2006  . 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 Skin cancer   . Splenic artery aneurysm - last CT 2012 stable 08/16/2014   Past Surgical History:  Procedure Laterality Date  . ABDOMINAL HYSTERECTOMY    . OOPHORECTOMY      FAMILY HISTORY Family History  Problem Relation Age of Onset  . Diabetes Father   . Diabetes Sister   . Cancer Brother        pancreatic, prostate  . Diabetes Brother     SOCIAL HISTORY Social History   Tobacco Use  . Smoking status: Never Smoker  . Smokeless tobacco: Never Used  Substance Use Topics  . Alcohol use: No  . Drug use: No         OPHTHALMIC EXAM:  Base Eye Exam    Visual Acuity (Snellen - Linear)  Right Left   Dist cc LP 20/200 -1   Dist ph cc NI NI   Correction: Glasses       Tonometry (Tonopen, 1:36 PM)      Right Left   Pressure 14 16       Pupils      Pupils Dark Light Shape React APD   Right PERRL 4 3 Round Slow None   Left PERRL 4 3 Round Slow None       Visual Fields (Counting fingers)      Left Right    Full    Restrictions  Total superior temporal, inferior temporal, superior nasal, inferior nasal deficiencies       Extraocular Movement      Right Left    Full Full       Neuro/Psych    Oriented x3: Yes   Mood/Affect: Normal       Dilation    Left eye: 1.0% Mydriacyl, 2.5% Phenylephrine @ 1:36 PM        Slit Lamp and Fundus Exam    External Exam      Right Left   External Normal Normal       Slit Lamp Exam      Right Left   Lids/Lashes Normal Normal    Conjunctiva/Sclera White and quiet White and quiet   Cornea Clear Clear   Anterior Chamber Deep and quiet Deep and quiet   Iris Round and reactive Round and reactive   Lens 4+ Nuclear sclerosis Posterior chamber intraocular lens   Anterior Vitreous Normal Normal       Fundus Exam      Right Left   Posterior Vitreous  Posterior vitreous detachment   Disc  Peripapillary atrophy   C/D Ratio  0.3   Macula  Subretinal neovascular membrane, Retinal pigment epithelial mottling, Cystoid macular edema, Macular thickening, Retinal pigment epithelial atrophy, Geographic atrophy   Vessels  Normal   Periphery  Normal          IMAGING AND PROCEDURES  Imaging and Procedures for 03/21/20  OCT, Retina - OU - Both Eyes       Left Eye Quality was good. Scan locations included subfoveal. Central Foveal Thickness: 405. Progression has improved. Findings include subretinal hyper-reflective material, disciform scar, subretinal scarring.   Notes OS, now with much less subretinal scarring, much less subretinal fluid and less intraretinal cystoid macular edema.,  Improved anatomy after Avastin No. 1 OS.  Will need repeat Avastin today  There are no views of the posterior pole fundus in the right eye due to mature cataract discussed with with family and patient                ASSESSMENT/PLAN:  Exudative age-related macular degeneration of left eye with active choroidal neovascularization ([place]) The nature of wet macular degeneration was discussed with the patient.  Forms of therapy reviewed include the use of Anti-VEGF medications injected painlessly into the eye, as well as other possible treatment modalities, including thermal laser therapy. Fellow eye involvement and risks were discussed with the patient. Upon the finding of wet age related macular degeneration, treatment will be offered. The treatment regimen is on a treat as needed basis with the intent to treat if necessary and extend  interval of exams when possible. On average 1 out of 6 patients do not need lifetime therapy. However, the risk of recurrent disease is high for a lifetime.  Initially monthly, then periodic, examinations and evaluations will determine  whether the next treatment is required on the day of the examination.  OS, much improved anatomy to the macula after Avastin No. 1.  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 I discussed the risks and benefits of cataract surgery. Options were explained to the patient. The patient understands that new glasses may not improve their vision and desires to have cataract surgery. I have recommended follow up with their general eye care doctor for evaluation and consideration of cataract extraction with new intraocular lens insertion.  OD with mature cataract which prevents examination of the fundus, macula with either OCT or clinically.  This poses a special risk to the possible development of age-related maculopathy which would not be treatable since we are unable to evaluate it or assess the function of the macula.  For this reason I did suggest that she consider undertaking cataract evaluation with her surgeon and consideration of of intervention to mildly maximize visual potential but also to allow for medical monitoring of her condition in the right eye      ICD-10-CM   1. Exudative age-related macular degeneration of left eye with active choroidal neovascularization (HCC)  H35.3221 OCT, Retina - OU - Both Eyes    Intravitreal Injection, Pharmacologic Agent - OS - Left Eye  2. Retinal hemorrhage of left eye  H35.62   3. 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  2.  3.  Ophthalmic Meds Ordered this visit:  No orders of the defined types were placed in this encounter.      Return in about 5 weeks (around 04/25/2020) for DILATE OU,  AVASTIN OCT, OS.  There are no Patient Instructions on file for this visit.   Explained the diagnoses, plan, and follow up with the patient and they expressed understanding.  Patient expressed understanding of the importance of proper follow up care.   Clent Demark Jaquon Gingerich M.D. Diseases & Surgery of the Retina and Vitreous [facility] [place] 03/21/20     Abbreviations: M myopia (nearsighted); A astigmatism; H hyperopia (farsighted); P presbyopia; Mrx spectacle prescription;  CTL contact lenses; OD right eye; OS left eye; OU both eyes  XT exotropia; ET esotropia; PEK punctate epithelial keratitis; PEE punctate epithelial erosions; DES dry eye syndrome; MGD meibomian gland dysfunction; ATs artificial tears; PFAT's preservative free artificial tears; [place] nuclear sclerotic cataract; PSC posterior subcapsular cataract; ERM epi-retinal membrane; PVD posterior vitreous detachment; RD retinal detachment; DM diabetes mellitus; DR diabetic retinopathy; NPDR non-proliferative diabetic retinopathy; PDR proliferative diabetic retinopathy; CSME clinically significant macular edema; DME diabetic macular edema; dbh dot blot hemorrhages; CWS cotton wool spot; POAG primary open angle glaucoma; C/D cup-to-disc ratio; HVF humphrey visual field; GVF goldmann visual field; OCT optical coherence tomography; IOP intraocular pressure; BRVO Branch retinal vein occlusion; CRVO central retinal vein occlusion; CRAO central retinal artery occlusion; BRAO branch retinal artery occlusion; RT retinal tear; SB scleral buckle; PPV pars plana vitrectomy; VH Vitreous hemorrhage; PRP panretinal laser photocoagulation; IVK intravitreal kenalog; VMT vitreomacular traction; MH Macular hole;  NVD neovascularization of the disc; NVE neovascularization elsewhere; AREDS age related eye disease study; ARMD age related macular degeneration; POAG primary open angle glaucoma; EBMD epithelial/anterior basement membrane dystrophy; ACIOL anterior  chamber intraocular lens; IOL intraocular lens; PCIOL posterior chamber intraocular lens; Phaco/IOL phacoemulsification with intraocular lens placement; PRK photorefractive keratectomy; LASIK laser assisted in situ keratomileusis; HTN hypertension;  DM diabetes mellitus; COPD chronic obstructive pulmonary disease

## 2020-03-21 NOTE — Assessment & Plan Note (Signed)
The nature of wet macular degeneration was discussed with the patient.  Forms of therapy reviewed include the use of Anti-VEGF medications injected painlessly into the eye, as well as other possible treatment modalities, including thermal laser therapy. Fellow eye involvement and risks were discussed with the patient. Upon the finding of wet age related macular degeneration, treatment will be offered. The treatment regimen is on a treat as needed basis with the intent to treat if necessary and extend interval of exams when possible. On average 1 out of 6 patients do not need lifetime therapy. However, the risk of recurrent disease is high for a lifetime.  Initially monthly, then periodic, examinations and evaluations will determine whether the next treatment is required on the day of the examination.  OS, much improved anatomy to the macula after Avastin No. 1.  Repeat Avastin OS today and exam and follow-up 4 to 5 weeks

## 2020-03-21 NOTE — Assessment & Plan Note (Signed)
Cataract(s) account for the patient's complaint. I discussed the risks and benefits of cataract surgery. Options were explained to the patient. The patient understands that new glasses may not improve their vision and desires to have cataract surgery. I have recommended follow up with their general eye care doctor for evaluation and consideration of cataract extraction with new intraocular lens insertion.  OD with mature cataract which prevents examination of the fundus, macula with either OCT or clinically.  This poses a special risk to the possible development of age-related maculopathy which would not be treatable since we are unable to evaluate it or assess the function of the macula.  For this reason I did suggest that she consider undertaking cataract evaluation with her surgeon and consideration of of intervention to mildly maximize visual potential but also to allow for medical monitoring of her condition in the right eye

## 2020-04-18 ENCOUNTER — Ambulatory Visit (INDEPENDENT_AMBULATORY_CARE_PROVIDER_SITE_OTHER): Payer: Medicare Other | Admitting: Ophthalmology

## 2020-04-18 ENCOUNTER — Encounter (INDEPENDENT_AMBULATORY_CARE_PROVIDER_SITE_OTHER): Payer: Self-pay | Admitting: Ophthalmology

## 2020-04-18 ENCOUNTER — Other Ambulatory Visit: Payer: Self-pay

## 2020-04-18 DIAGNOSIS — H353221 Exudative age-related macular degeneration, left eye, with active choroidal neovascularization: Secondary | ICD-10-CM

## 2020-04-18 DIAGNOSIS — H2521 Age-related cataract, morgagnian type, right eye: Secondary | ICD-10-CM

## 2020-04-18 MED ORDER — BEVACIZUMAB CHEMO INJECTION 1.25MG/0.05ML SYRINGE FOR KALEIDOSCOPE
1.2500 mg | INTRAVITREAL | Status: AC | PRN
  Administered 2020-04-18: 1.25 mg via INTRAVITREAL

## 2020-04-18 NOTE — Assessment & Plan Note (Signed)
It has planned cataract extraction with intraocular lens placement on June 8 OD with Dr. Ellie Lunch

## 2020-04-18 NOTE — Assessment & Plan Note (Signed)
The nature of wet macular degeneration was discussed with the patient.  Forms of therapy reviewed include the use of Anti-VEGF medications injected painlessly into the eye, as well as other possible treatment modalities, including thermal laser therapy. Fellow eye involvement and risks were discussed with the patient. Upon the finding of wet age related macular degeneration, treatment will be offered. The treatment regimen is on a treat as needed basis with the intent to treat if necessary and extend interval of exams when possible. On average 1 out of 6 patients do not need lifetime therapy. However, the risk of recurrent disease is high for a lifetime.  Initially monthly, then periodic, examinations and evaluations will determine whether the next treatment is required on the day of the examination.  OS, improved anatomy overall.  Repeat intravitreal Avastin OS today, #3

## 2020-04-18 NOTE — Progress Notes (Signed)
04/18/2020     CHIEF COMPLAINT Patient presents for Retina Follow Up   HISTORY OF PRESENT ILLNESS: Kimberly Maynard is a 86 y.o. female who presents to the clinic today for:   HPI    Retina Follow Up    Patient presents with  Wet AMD.  In left eye.  Duration of 4 weeks.  Since onset it is stable.          Comments    4 week follow up- OCT OU, Possible Avastin OS Patient states that her vision is getting clearer and overall has no complaints.        Last edited by Gerda Diss on 04/18/2020  1:46 PM. (History)      Referring physician: Caren Macadam, MD [place],  [place] [address]  HISTORICAL INFORMATION:   Selected notes from the MEDICAL RECORD NUMBER    Lab Results  Component Value Date   HGBA1C 5.2 12/01/2018     CURRENT MEDICATIONS: No current outpatient medications on file. (Ophthalmic Drugs)   No current facility-administered medications for this visit. (Ophthalmic Drugs)   Current Outpatient Medications (Other)  Medication Sig  . acetaminophen (TYLENOL) 650 MG CR tablet Take 1 tablet (650 mg total) by mouth as needed for pain.  . benzonatate (TESSALON PERLES) 100 MG capsule Take 1 capsule (100 mg total) by mouth 3 (three) times daily as needed.  . DULoxetine (CYMBALTA) 20 MG capsule Take 1 capsule (20 mg total) by mouth daily.  Arna Medici 75 MCG tablet Take 1 tablet by mouth once daily  . fluticasone (FLONASE) 50 MCG/ACT nasal spray Place 2 sprays into both nostrils daily.  . Magnesium-Zinc (MAGNESIUM-CHELATED ZINC PO) Take 1 tablet by mouth daily.   . sodium chloride (AYR) 0.65 % nasal spray Place 1 spray into the nose as needed for congestion.   No current facility-administered medications for this visit. (Other)      REVIEW OF SYSTEMS:    ALLERGIES Allergies  Allergen Reactions  . Carisoprodol     REACTION: unspecified  . Cyclobenzaprine Hcl     REACTION: unspecified  . Penicillins     REACTION: itching     PAST MEDICAL HISTORY Past Medical History:  Diagnosis Date  . ANEMIA 01/15/2008   resolved  . ANEURYSM NOS 08/30/2006   offered imaging to assess stability, no guidelines for this, she declined  . DEPRESSION 05/05/2007  . FASTING HYPERGLYCEMIA 05/05/2007  . HYPERLIPIDEMIA 01/15/2008  . HYPOTHYROIDISM 08/30/2006  . OSTEOPENIA 08/30/2006  . 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 Skin cancer   . Splenic artery aneurysm - last CT 2012 stable 08/16/2014   Past Surgical History:  Procedure Laterality Date  . ABDOMINAL HYSTERECTOMY    . OOPHORECTOMY      FAMILY HISTORY Family History  Problem Relation Age of Onset  . Diabetes Father   . Diabetes Sister   . Cancer Brother        pancreatic, prostate  . Diabetes Brother     SOCIAL HISTORY Social History   Tobacco Use  . Smoking status: Never Smoker  . Smokeless tobacco: Never Used  Substance Use Topics  . Alcohol use: No  . Drug use: No         OPHTHALMIC EXAM:  Base Eye Exam    Visual Acuity (Snellen - Linear)      Right Left   Dist [place] LP 20/400   Dist ph [place] NI NI  Tonometry (Tonopen, 1:52 PM)      Right Left   Pressure 14 14       Pupils      Pupils Dark Light Shape React APD   Right PERRL 5 4 Round Slow None   Left PERRL 5 4 Round Slow None       Visual Fields (Counting fingers)      Left Right    Full Full       Extraocular Movement      Right Left    Full Full       Neuro/Psych    Oriented x3: Yes   Mood/Affect: Normal       Dilation    Both eyes: 1.0% Mydriacyl, 2.5% Phenylephrine @ 1:52 PM        Slit Lamp and Fundus Exam    External Exam      Right Left   External Normal Normal       Slit Lamp Exam      Right Left   Lids/Lashes Normal Normal   Conjunctiva/Sclera White and quiet White and quiet   Cornea Clear Clear   Anterior Chamber Deep and quiet Deep and quiet   Iris Round and reactive Round and reactive   Lens 4+ Nuclear sclerosis,  nearly more [place] Posterior chamber intraocular lens   Anterior Vitreous Normal Normal       Fundus Exam      Right Left   Posterior Vitreous  Posterior vitreous detachment   Disc  Peripapillary atrophy   C/D Ratio  0.3   Macula  Subretinal neovascular membrane, Retinal pigment epithelial mottling, Cystoid macular edema, Macular thickening, Retinal pigment epithelial atrophy, Geographic atrophy   Vessels  Normal   Periphery  Normal          IMAGING AND PROCEDURES  Imaging and Procedures for 04/18/20  OCT, Retina - OU - Both Eyes       Right Eye Quality was poor.   Left Eye Quality was good. Scan locations included subfoveal. Central Foveal Thickness: 373. Progression has improved. Findings include subretinal hyper-reflective material, pigment epithelial detachment.   Notes OD, no view  OS, large subfoveal disciform scar, less adjacent subretinal fluid and less intraretinal fluid and CME status post Avastin No. 2.                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 Ellie Lunch  Exudative age-related macular degeneration of left eye with active choroidal neovascularization ([place]) The nature of wet macular degeneration was discussed with the patient.  Forms of therapy reviewed include the use of Anti-VEGF medications injected painlessly into the eye, as well as other possible treatment modalities, including thermal laser therapy. Fellow eye involvement and risks were discussed with the patient. Upon the finding of wet age related macular degeneration, treatment will be offered. The treatment regimen is on a treat as needed basis with the intent to treat if necessary and extend interval of exams when possible. On average 1 out of 6 patients do not need lifetime therapy. However, the risk of recurrent disease is high for a lifetime.  Initially monthly, then  periodic, examinations and evaluations will determine whether the next treatment is required on the day of the examination.  OS, improved anatomy overall.  Repeat intravitreal Avastin OS today, #3      ICD-10-CM   1. Exudative age-related macular degeneration of left  eye with active choroidal neovascularization ([place])  H35.3221 OCT, Retina - OU - Both Eyes  2. 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  Intravitreal Avastin OS today.  Pete examination OS in 5 weeks  2.  Cataract extraction with intraocular lens placement with Dr. Ellie Lunch on June 8  3.  Ophthalmic Meds Ordered this visit:  No orders of the defined types were placed in this encounter.      No follow-ups on file.  There are no Patient Instructions on file for this visit.   Explained the diagnoses, plan, and follow up with the patient and they expressed understanding.  Patient expressed understanding of the importance of proper follow up care.   Clent Demark Dnyla Antonetti M.D. Diseases & Surgery of the Retina and Vitreous Retina & Diabetic [place] 04/18/20     Abbreviations: M myopia (nearsighted); A astigmatism; H hyperopia (farsighted); P presbyopia; Mrx spectacle prescription;  CTL contact lenses; OD right eye; OS left eye; OU both eyes  XT exotropia; ET esotropia; PEK punctate epithelial keratitis; PEE punctate epithelial erosions; DES dry eye syndrome; MGD meibomian gland dysfunction; ATs artificial tears; PFAT's preservative free artificial tears; [place] nuclear sclerotic cataract; PSC posterior subcapsular cataract; ERM epi-retinal membrane; PVD posterior vitreous detachment; RD retinal detachment; DM diabetes mellitus; DR diabetic retinopathy; NPDR non-proliferative diabetic retinopathy; PDR proliferative diabetic retinopathy; CSME clinically significant macular edema; DME diabetic macular edema; dbh dot blot hemorrhages; CWS cotton wool spot; POAG primary open angle glaucoma; C/D cup-to-disc ratio; HVF  humphrey visual field; GVF goldmann visual field; OCT optical coherence tomography; IOP intraocular pressure; BRVO Branch retinal vein occlusion; CRVO central retinal vein occlusion; CRAO central retinal artery occlusion; BRAO branch retinal artery occlusion; RT retinal tear; SB scleral buckle; PPV pars plana vitrectomy; VH Vitreous hemorrhage; PRP panretinal laser photocoagulation; IVK intravitreal kenalog; VMT vitreomacular traction; MH Macular hole;  NVD neovascularization of the disc; NVE neovascularization elsewhere; AREDS age related eye disease study; ARMD age related macular degeneration; POAG primary open angle glaucoma; EBMD epithelial/anterior basement membrane dystrophy; ACIOL anterior chamber intraocular lens; IOL intraocular lens; PCIOL posterior chamber intraocular lens; Phaco/IOL phacoemulsification with intraocular lens placement; [place] photorefractive keratectomy; LASIK laser assisted in situ keratomileusis; HTN hypertension; DM diabetes mellitus; COPD chronic obstructive pulmonary disease

## 2020-05-02 DIAGNOSIS — M7052 Other bursitis of knee, left knee: Secondary | ICD-10-CM | POA: Diagnosis not present

## 2020-05-03 DIAGNOSIS — M7052 Other bursitis of knee, left knee: Secondary | ICD-10-CM | POA: Diagnosis not present

## 2020-05-03 DIAGNOSIS — M1712 Unilateral primary osteoarthritis, left knee: Secondary | ICD-10-CM | POA: Diagnosis not present

## 2020-05-05 DIAGNOSIS — H268 Other specified cataract: Secondary | ICD-10-CM | POA: Diagnosis not present

## 2020-05-05 DIAGNOSIS — H25811 Combined forms of age-related cataract, right eye: Secondary | ICD-10-CM | POA: Diagnosis not present

## 2020-05-05 DIAGNOSIS — H2521 Age-related cataract, morgagnian type, right eye: Secondary | ICD-10-CM | POA: Diagnosis not present

## 2020-05-05 DIAGNOSIS — H5709 Other anomalies of pupillary function: Secondary | ICD-10-CM | POA: Diagnosis not present

## 2020-05-23 ENCOUNTER — Other Ambulatory Visit: Payer: Self-pay

## 2020-05-23 ENCOUNTER — Encounter (INDEPENDENT_AMBULATORY_CARE_PROVIDER_SITE_OTHER): Payer: Self-pay | Admitting: Ophthalmology

## 2020-05-23 ENCOUNTER — Ambulatory Visit (INDEPENDENT_AMBULATORY_CARE_PROVIDER_SITE_OTHER): Payer: Medicare Other | Admitting: Ophthalmology

## 2020-05-23 DIAGNOSIS — H353221 Exudative age-related macular degeneration, left eye, with active choroidal neovascularization: Secondary | ICD-10-CM | POA: Diagnosis not present

## 2020-05-23 MED ORDER — BEVACIZUMAB CHEMO INJECTION 1.25MG/0.05ML SYRINGE FOR KALEIDOSCOPE
1.2500 mg | INTRAVITREAL | Status: AC | PRN
  Administered 2020-05-23: 1.25 mg via INTRAVITREAL

## 2020-05-23 NOTE — Progress Notes (Signed)
05/23/2020     CHIEF COMPLAINT Patient presents for Retina Follow Up   HISTORY OF PRESENT ILLNESS: Kimberly Maynard is a 86 y.o. female who presents to the clinic today for:   HPI    Retina Follow Up    Patient presents with  Wet AMD.  In left eye.  Duration of 5 weeks.  Since onset it is stable.          Comments    5 week follow up - OCT OU, Poss Avastin OS Patient denies change in vision and overall has no complaints. Pt had cat sx OD 06/08 and states she is doing well.       Last edited by Gerda Diss on 05/23/2020  1:50 PM. (History)      Referring physician: Caren Macadam, MD [place],   [id number]  HISTORICAL INFORMATION:   Selected notes from the MEDICAL RECORD NUMBER    Lab Results  Component Value Date   HGBA1C 5.2 12/01/2018     CURRENT MEDICATIONS: Current Outpatient Medications (Ophthalmic Drugs)  Medication Sig  . moxifloxacin (VIGAMOX) 0.5 % ophthalmic solution Place 1 drop into the right eye 4 (four) times daily.   No current facility-administered medications for this visit. (Ophthalmic Drugs)   Current Outpatient Medications (Other)  Medication Sig  . acetaminophen (TYLENOL) 650 MG CR tablet Take 1 tablet (650 mg total) by mouth as needed for pain.  . benzonatate (TESSALON PERLES) 100 MG capsule Take 1 capsule (100 mg total) by mouth 3 (three) times daily as needed.  . DULoxetine (CYMBALTA) 20 MG capsule Take 1 capsule (20 mg total) by mouth daily.  Arna Medici 75 MCG tablet Take 1 tablet by mouth once daily  . fluticasone (FLONASE) 50 MCG/ACT nasal spray Place 2 sprays into both nostrils daily.  . Magnesium-Zinc (MAGNESIUM-CHELATED ZINC PO) Take 1 tablet by mouth daily.   . sodium chloride (AYR) 0.65 % nasal spray Place 1 spray into the nose as needed for congestion.   No current facility-administered medications for this visit. (Other)      REVIEW OF SYSTEMS:    ALLERGIES Allergies  Allergen  Reactions  . Carisoprodol     REACTION: unspecified  . Cyclobenzaprine Hcl     REACTION: unspecified  . Penicillins     REACTION: itching    PAST MEDICAL HISTORY Past Medical History:  Diagnosis Date  . ANEMIA 01/15/2008   resolved  . ANEURYSM NOS 08/30/2006   offered imaging to assess stability, no guidelines for this, she declined  . DEPRESSION 05/05/2007  . FASTING HYPERGLYCEMIA 05/05/2007  . HYPERLIPIDEMIA 01/15/2008  . HYPOTHYROIDISM 08/30/2006  . OSTEOPENIA 08/30/2006  . 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 Skin cancer   . Splenic artery aneurysm - last CT 2012 stable 08/16/2014   Past Surgical History:  Procedure Laterality Date  . ABDOMINAL HYSTERECTOMY    . OOPHORECTOMY      FAMILY HISTORY Family History  Problem Relation Age of Onset  . Diabetes Father   . Diabetes Sister   . Cancer Brother        pancreatic, prostate  . Diabetes Brother     SOCIAL HISTORY Social History   Tobacco Use  . Smoking status: Never Smoker  . Smokeless tobacco: Never Used  Substance Use Topics  . Alcohol use: No  . Drug use: No         OPHTHALMIC EXAM:  Base Eye Exam  Visual Acuity (Snellen - Linear)      Right Left   Dist cc 20/80+2 20/200-1   Dist ph cc NI NI       Tonometry (Tonopen, 1:55 PM)      Right Left   Pressure 14 11       Pupils      Pupils Dark Light Shape React APD   Right PERRL 5 4 Round Slow None   Left PERRL 5 4 Round Slow None       Visual Fields (Counting fingers)      Left Right    Full Full       Extraocular Movement      Right Left    Full Full       Neuro/Psych    Oriented x3: Yes   Mood/Affect: Normal       Dilation    Left eye: 1.0% Mydriacyl, 2.5% Phenylephrine @ 1:55 PM        Slit Lamp and Fundus Exam    External Exam      Right Left   External Normal Normal       Slit Lamp Exam      Right Left   Lids/Lashes Normal Normal   Conjunctiva/Sclera White and quiet White and quiet     Cornea Clear Clear   Anterior Chamber Deep and quiet Deep and quiet   Iris Round and reactive Round and reactive   Lens Posterior chamber intraocular lens Posterior chamber intraocular lens   Anterior Vitreous Normal Normal       Fundus Exam      Right Left   Posterior Vitreous  Posterior vitreous detachment   Disc  Peripapillary atrophy   C/D Ratio  0.3   Macula  Subretinal neovascular membrane less active, Retinal pigment epithelial mottling,  Less Cystoid macular edema, Macular thickening, Retinal pigment epithelial atrophy, Geographic atrophy   Vessels  Normal   Periphery  Normal          IMAGING AND PROCEDURES  Imaging and Procedures for 05/23/20  OCT, Retina - OU - Both Eyes       Right Eye Quality was good. Scan locations included subfoveal. Central Foveal Thickness: 256. Findings include no IRF, no SRF, retinal drusen .   Left Eye Quality was good. Scan locations included subfoveal. Central Foveal Thickness: 373. Progression has improved. Findings include choroidal neovascular membrane, disciform scar, intraretinal fluid.   Notes OS much improved macular anatomy with subfoveal disciform but much less compared to March 2021                ASSESSMENT/PLAN:  Exudative age-related macular degeneration of left eye with active choroidal neovascularization ([place]) Much less CNVM activity subfoveal OS now on therapy since March 2021.        ICD-10-CM   1. Exudative age-related macular degeneration of left eye with active choroidal neovascularization ([facility])  H35.3221 OCT, Retina - OU - Both Eyes    1.  2.  3.  Ophthalmic Meds Ordered this visit:  No orders of the defined types were placed in this encounter.      Return in about 6 weeks (around 07/04/2020) for dilate, OS, AVASTIN OCT.  There are no Patient Instructions on file for this visit.   Explained the diagnoses, plan, and follow up with the patient and they expressed understanding.  Patient  expressed understanding of the importance of proper follow up care.   Clent Demark Trezure Cronk M.D. Diseases & Surgery of the  Retina and Vitreous Retina & Diabetic [place] 05/23/20     Abbreviations: M myopia (nearsighted); A astigmatism; H hyperopia (farsighted); P presbyopia; Mrx spectacle prescription;  CTL contact lenses; OD right eye; OS left eye; OU both eyes  XT exotropia; ET esotropia; PEK punctate epithelial keratitis; PEE punctate epithelial erosions; DES dry eye syndrome; MGD meibomian gland dysfunction; ATs artificial tears; PFAT's preservative free artificial tears; [place] nuclear sclerotic cataract; PSC posterior subcapsular cataract; ERM epi-retinal membrane; PVD posterior vitreous detachment; RD retinal detachment; DM diabetes mellitus; DR diabetic retinopathy; NPDR non-proliferative diabetic retinopathy; PDR proliferative diabetic retinopathy; CSME clinically significant macular edema; DME diabetic macular edema; dbh dot blot hemorrhages; CWS cotton wool spot; POAG primary open angle glaucoma; C/D cup-to-disc ratio; HVF humphrey visual field; GVF goldmann visual field; OCT optical coherence tomography; IOP intraocular pressure; BRVO Branch retinal vein occlusion; CRVO central retinal vein occlusion; CRAO central retinal artery occlusion; BRAO branch retinal artery occlusion; RT retinal tear; SB scleral buckle; PPV pars plana vitrectomy; VH Vitreous hemorrhage; PRP panretinal laser photocoagulation; IVK intravitreal kenalog; VMT vitreomacular traction; MH Macular hole;  NVD neovascularization of the disc; NVE neovascularization elsewhere; AREDS age related eye disease study; ARMD age related macular degeneration; POAG primary open angle glaucoma; EBMD epithelial/anterior basement membrane dystrophy; ACIOL anterior chamber intraocular lens; IOL intraocular lens; PCIOL posterior chamber intraocular lens; Phaco/IOL phacoemulsification with intraocular lens placement; [place] photorefractive keratectomy;  LASIK laser assisted in situ keratomileusis; HTN hypertension; DM diabetes mellitus; COPD chronic obstructive pulmonary disease

## 2020-05-23 NOTE — Assessment & Plan Note (Signed)
Much less CNVM activity subfoveal OS now on therapy since March 2021.

## 2020-06-06 DIAGNOSIS — E039 Hypothyroidism, unspecified: Secondary | ICD-10-CM | POA: Diagnosis not present

## 2020-06-06 DIAGNOSIS — I119 Hypertensive heart disease without heart failure: Secondary | ICD-10-CM | POA: Diagnosis not present

## 2020-06-06 DIAGNOSIS — M25569 Pain in unspecified knee: Secondary | ICD-10-CM | POA: Diagnosis not present

## 2020-06-06 DIAGNOSIS — I700 Atherosclerosis of aorta: Secondary | ICD-10-CM | POA: Diagnosis not present

## 2020-07-04 ENCOUNTER — Other Ambulatory Visit: Payer: Self-pay

## 2020-07-04 ENCOUNTER — Ambulatory Visit (INDEPENDENT_AMBULATORY_CARE_PROVIDER_SITE_OTHER): Payer: Medicare Other | Admitting: Ophthalmology

## 2020-07-04 ENCOUNTER — Encounter (INDEPENDENT_AMBULATORY_CARE_PROVIDER_SITE_OTHER): Payer: Self-pay | Admitting: Ophthalmology

## 2020-07-04 DIAGNOSIS — H353221 Exudative age-related macular degeneration, left eye, with active choroidal neovascularization: Secondary | ICD-10-CM

## 2020-07-04 MED ORDER — BEVACIZUMAB CHEMO INJECTION 1.25MG/0.05ML SYRINGE FOR KALEIDOSCOPE
1.2500 mg | INTRAVITREAL | Status: AC | PRN
  Administered 2020-07-04: 1.25 mg via INTRAVITREAL

## 2020-07-04 NOTE — Progress Notes (Signed)
07/04/2020     CHIEF COMPLAINT Patient presents for Retina Follow Up   HISTORY OF PRESENT ILLNESS: Kimberly Maynard is a 86 y.o. female who presents to the clinic today for:   HPI    Retina Follow Up    Patient presents with  Wet AMD.  In left eye.  This started 6 weeks ago.  Severity is mild.  Duration of 6 weeks.  Since onset it is stable.          Comments    6 Week AMD F/U OS, poss Avastin OS  Pt denies noticeable changes to [place] OU since last visit. Pt denies ocular pain, flashes of light, or floaters OU. Pt sts, "I love them because they got me to see."        Last edited by Rockie Neighbours, [place] on 07/04/2020  2:24 PM. (History)      Referring physician: Caren Macadam, MD [place],  [place] [address]  HISTORICAL INFORMATION:   Selected notes from the MEDICAL RECORD NUMBER    Lab Results  Component Value Date   HGBA1C 5.2 12/01/2018     CURRENT MEDICATIONS: Current Outpatient Medications (Ophthalmic Drugs)  Medication Sig  . moxifloxacin (VIGAMOX) 0.5 % ophthalmic solution Place 1 drop into the right eye 4 (four) times daily.   No current facility-administered medications for this visit. (Ophthalmic Drugs)   Current Outpatient Medications (Other)  Medication Sig  . acetaminophen (TYLENOL) 650 MG CR tablet Take 1 tablet (650 mg total) by mouth as needed for pain.  . benzonatate (TESSALON PERLES) 100 MG capsule Take 1 capsule (100 mg total) by mouth 3 (three) times daily as needed.  . DULoxetine (CYMBALTA) 20 MG capsule Take 1 capsule (20 mg total) by mouth daily.  Arna Medici 75 MCG tablet Take 1 tablet by mouth once daily  . fluticasone (FLONASE) 50 MCG/ACT nasal spray Place 2 sprays into both nostrils daily.  . Magnesium-Zinc (MAGNESIUM-CHELATED ZINC PO) Take 1 tablet by mouth daily.   . sodium chloride (AYR) 0.65 % nasal spray Place 1 spray into the nose as needed for congestion.   No current facility-administered medications for this  visit. (Other)      REVIEW OF SYSTEMS:    ALLERGIES Allergies  Allergen Reactions  . Carisoprodol     REACTION: unspecified  . Cyclobenzaprine Hcl     REACTION: unspecified  . Penicillins     REACTION: itching    PAST MEDICAL HISTORY Past Medical History:  Diagnosis Date  . ANEMIA 01/15/2008   resolved  . ANEURYSM NOS 08/30/2006   offered imaging to assess stability, no guidelines for this, she declined  . DEPRESSION 05/05/2007  . FASTING HYPERGLYCEMIA 05/05/2007  . HYPERLIPIDEMIA 01/15/2008  . HYPOTHYROIDISM 08/30/2006  . OSTEOPENIA 08/30/2006  . 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 Skin cancer   . Splenic artery aneurysm - last CT 2012 stable 08/16/2014   Past Surgical History:  Procedure Laterality Date  . ABDOMINAL HYSTERECTOMY    . OOPHORECTOMY      FAMILY HISTORY Family History  Problem Relation Age of Onset  . Diabetes Father   . Diabetes Sister   . Cancer Brother        pancreatic, prostate  . Diabetes Brother     SOCIAL HISTORY Social History   Tobacco Use  . Smoking status: Never Smoker  . Smokeless tobacco: Never Used  Substance Use Topics  . Alcohol use: No  .  Drug use: No         OPHTHALMIC EXAM:  Base Eye Exam    Visual Acuity (ETDRS)      Right Left   Dist cc 20/40 -2 20/200   Dist ph cc NI NI   Correction: Glasses       Tonometry (Tonopen, 2:29 PM)      Right Left   Pressure 15 15       Pupils      Pupils Dark Light Shape React APD   Right PERRL 5 4 Round Slow None   Left PERRL 5 4 Round Slow None       Visual Fields (Counting fingers)      Left Right    Full Full       Extraocular Movement      Right Left    Full Full       Neuro/Psych    Oriented x3: Yes   Mood/Affect: Normal       Dilation    Left eye: 1.0% Mydriacyl, 2.5% Phenylephrine @ 2:29 PM        Slit Lamp and Fundus Exam    External Exam      Right Left   External Normal Normal       Slit Lamp Exam       Right Left   Lids/Lashes Normal Normal   Conjunctiva/Sclera White and quiet White and quiet   Cornea Clear Clear   Anterior Chamber Deep and quiet Deep and quiet   Iris Round and reactive Round and reactive   Lens Posterior chamber intraocular lens Posterior chamber intraocular lens   Anterior Vitreous Normal Normal       Fundus Exam      Right Left   Posterior Vitreous  Posterior vitreous detachment   Disc  Peripapillary atrophy   C/D Ratio  0.2   Macula  Subretinal neovascular membrane less active, Retinal pigment epithelial mottling,  Less Cystoid macular edema, Macular thickening, Retinal pigment epithelial atrophy, Geographic atrophy,, Disciform scar, no subretinal hemorrhage or fluid   Vessels  Normal   Periphery  Normal          IMAGING AND PROCEDURES  Imaging and Procedures for 07/04/20  OCT, Retina - OU - Both Eyes       Right Eye Quality was good. Scan locations included subfoveal. Central Foveal Thickness: 262. Progression has been stable. Findings include no SRF, retinal drusen .   Left Eye Quality was good. Scan locations included subfoveal. Central Foveal Thickness: 361. Progression has improved. Findings include disciform scar, intraretinal hyper-reflective material, subretinal hyper-reflective material.   Notes OS, vastly improved yet with subretinal fibrosis from disciform macular scar.  Much less active edges at 6-week follow-up post intravitreal Avastin.       Intravitreal Injection, Pharmacologic Agent - OS - Left Eye       Time Out 07/04/2020. 3:20 PM. Confirmed correct patient, procedure, site, and patient consented.   Anesthesia Topical anesthesia was used. Anesthetic medications included Akten 3.5%.   Procedure Preparation included Ofloxacin , 5% betadine to ocular surface, 10% betadine to eyelids. A 30 gauge needle was used.   Injection:  1.25 mg Bevacizumab (AVASTIN) SOLN   NDC: [address]-7, Lot: [id number]   Route: Intravitreal, Site: Left  Eye, Waste: 0 mg  Post-op Post injection exam found visual acuity of at least counting fingers. The patient tolerated the procedure well. There were no complications. The patient received written and verbal post procedure care education. Post  injection medications were not given.                 ASSESSMENT/PLAN:  Exudative age-related macular degeneration of left eye with active choroidal neovascularization (HCC) OS, vastly improved yet with subretinal fibrosis from disciform macular scar.  Much less active edges at 6-week follow-up post intravitreal Avastin.      ICD-10-CM   1. Exudative age-related macular degeneration of left eye with active choroidal neovascularization (HCC)  H35.3221 OCT, Retina - OU - Both Eyes    Intravitreal Injection, Pharmacologic Agent - OS - Left Eye    Bevacizumab (AVASTIN) SOLN 1.25 mg    1.  2.  3.  Ophthalmic Meds Ordered this visit:  Meds ordered this encounter  Medications  . Bevacizumab (AVASTIN) SOLN 1.25 mg       Return in about 6 weeks (around 08/15/2020) for DILATE OU, AVASTIN OCT, OS.  Patient Instructions  To notify the office should new visual acuity symptoms or distortions arise in either eye    Explained the diagnoses, plan, and follow up with the patient and they expressed understanding.  Patient expressed understanding of the importance of proper follow up care.   Clent Demark Dellar Traber M.D. Diseases & Surgery of the Retina and Vitreous Retina [facility] Diabetic [place] 07/04/20     Abbreviations: M myopia (nearsighted); A astigmatism; H hyperopia (farsighted); P presbyopia; Mrx spectacle prescription;  CTL contact lenses; OD right eye; OS left eye; OU both eyes  XT exotropia; ET esotropia; PEK punctate epithelial keratitis; PEE punctate epithelial erosions; DES dry eye syndrome; MGD meibomian gland dysfunction; ATs artificial tears; PFAT's preservative free artificial tears; [place] nuclear sclerotic cataract; PSC posterior  subcapsular cataract; ERM epi-retinal membrane; PVD posterior vitreous detachment; RD retinal detachment; DM diabetes mellitus; DR diabetic retinopathy; NPDR non-proliferative diabetic retinopathy; PDR proliferative diabetic retinopathy; CSME clinically significant macular edema; DME diabetic macular edema; dbh dot blot hemorrhages; CWS cotton wool spot; POAG primary open angle glaucoma; C/D cup-to-disc ratio; HVF humphrey visual field; GVF goldmann visual field; OCT optical coherence tomography; IOP intraocular pressure; BRVO Branch retinal vein occlusion; CRVO central retinal vein occlusion; CRAO central retinal artery occlusion; BRAO branch retinal artery occlusion; RT retinal tear; SB scleral buckle; PPV pars plana vitrectomy; VH Vitreous hemorrhage; PRP panretinal laser photocoagulation; IVK intravitreal kenalog; VMT vitreomacular traction; MH Macular hole;  NVD neovascularization of the disc; NVE neovascularization elsewhere; AREDS age related eye disease study; ARMD age related macular degeneration; POAG primary open angle glaucoma; EBMD epithelial/anterior basement membrane dystrophy; ACIOL anterior chamber intraocular lens; IOL intraocular lens; PCIOL posterior chamber intraocular lens; Phaco/IOL phacoemulsification with intraocular lens placement; [place] photorefractive keratectomy; LASIK laser assisted in situ keratomileusis; HTN hypertension; DM diabetes mellitus; COPD chronic obstructive pulmonary disease

## 2020-07-04 NOTE — Assessment & Plan Note (Signed)
OS, vastly improved yet with subretinal fibrosis from disciform macular scar.  Much less active edges at 6-week follow-up post intravitreal Avastin.

## 2020-07-04 NOTE — Patient Instructions (Signed)
To notify the office should new visual acuity symptoms or distortions arise in either eye

## 2020-08-02 DIAGNOSIS — Z961 Presence of intraocular lens: Secondary | ICD-10-CM | POA: Diagnosis not present

## 2020-08-15 ENCOUNTER — Ambulatory Visit (INDEPENDENT_AMBULATORY_CARE_PROVIDER_SITE_OTHER): Payer: Medicare Other | Admitting: Ophthalmology

## 2020-08-15 ENCOUNTER — Encounter (INDEPENDENT_AMBULATORY_CARE_PROVIDER_SITE_OTHER): Payer: Self-pay | Admitting: Ophthalmology

## 2020-08-15 ENCOUNTER — Other Ambulatory Visit: Payer: Self-pay

## 2020-08-15 DIAGNOSIS — H353221 Exudative age-related macular degeneration, left eye, with active choroidal neovascularization: Secondary | ICD-10-CM | POA: Diagnosis not present

## 2020-08-15 DIAGNOSIS — H353122 Nonexudative age-related macular degeneration, left eye, intermediate dry stage: Secondary | ICD-10-CM

## 2020-08-15 MED ORDER — BEVACIZUMAB CHEMO INJECTION 1.25MG/0.05ML SYRINGE FOR KALEIDOSCOPE
1.2500 mg | INTRAVITREAL | Status: AC | PRN
  Administered 2020-08-15: 1.25 mg via INTRAVITREAL

## 2020-08-15 NOTE — Assessment & Plan Note (Signed)

## 2020-08-15 NOTE — Progress Notes (Signed)
08/15/2020     CHIEF COMPLAINT Patient presents for Retina Follow Up   HISTORY OF PRESENT ILLNESS: Kimberly Maynard is a 86 y.o. female who presents to the clinic today for:   HPI    Retina Follow Up    Patient presents with  Wet AMD.  In left eye.  This started 6 weeks ago.  Severity is mild.  Duration of 6 weeks.  Since onset it is stable.          Comments    6 Week AMD F/U OU, poss Avastin OS  Pt denies noticeable changes to [place] OU since last visit. Pt denies ocular pain, flashes of light, or floaters OU.         Last edited by Rockie Neighbours, [place] on 08/15/2020  2:24 PM. (History)      Referring physician: Caren Macadam, MD [place],  [place] [address]  HISTORICAL INFORMATION:   Selected notes from the MEDICAL RECORD NUMBER    Lab Results  Component Value Date   HGBA1C 5.2 12/01/2018     CURRENT MEDICATIONS: Current Outpatient Medications (Ophthalmic Drugs)  Medication Sig  . moxifloxacin (VIGAMOX) 0.5 % ophthalmic solution Place 1 drop into the right eye 4 (four) times daily. (Patient not taking: Reported on 08/15/2020)   No current facility-administered medications for this visit. (Ophthalmic Drugs)   Current Outpatient Medications (Other)  Medication Sig  . acetaminophen (TYLENOL) 650 MG CR tablet Take 1 tablet (650 mg total) by mouth as needed for pain.  . benzonatate (TESSALON PERLES) 100 MG capsule Take 1 capsule (100 mg total) by mouth 3 (three) times daily as needed.  . DULoxetine (CYMBALTA) 20 MG capsule Take 1 capsule (20 mg total) by mouth daily.  Arna Medici 75 MCG tablet Take 1 tablet by mouth once daily  . fluticasone (FLONASE) 50 MCG/ACT nasal spray Place 2 sprays into both nostrils daily.  . Magnesium-Zinc (MAGNESIUM-CHELATED ZINC PO) Take 1 tablet by mouth daily.   . sodium chloride (AYR) 0.65 % nasal spray Place 1 spray into the nose as needed for congestion.   No current facility-administered medications for this  visit. (Other)      REVIEW OF SYSTEMS:    ALLERGIES Allergies  Allergen Reactions  . Carisoprodol     REACTION: unspecified  . Cyclobenzaprine Hcl     REACTION: unspecified  . Penicillins     REACTION: itching    PAST MEDICAL HISTORY Past Medical History:  Diagnosis Date  . ANEMIA 01/15/2008   resolved  . ANEURYSM NOS 08/30/2006   offered imaging to assess stability, no guidelines for this, she declined  . DEPRESSION 05/05/2007  . FASTING HYPERGLYCEMIA 05/05/2007  . HYPERLIPIDEMIA 01/15/2008  . HYPOTHYROIDISM 08/30/2006  . OSTEOPENIA 08/30/2006  . 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 Skin cancer   . Splenic artery aneurysm - last CT 2012 stable 08/16/2014   Past Surgical History:  Procedure Laterality Date  . ABDOMINAL HYSTERECTOMY    . OOPHORECTOMY      FAMILY HISTORY Family History  Problem Relation Age of Onset  . Diabetes Father   . Diabetes Sister   . Cancer Brother        pancreatic, prostate  . Diabetes Brother     SOCIAL HISTORY Social History   Tobacco Use  . Smoking status: Never Smoker  . Smokeless tobacco: Never Used  Substance Use Topics  . Alcohol use: No  . Drug use:  No         OPHTHALMIC EXAM: Base Eye Exam    Visual Acuity (ETDRS)      Right Left   Dist cc 20/40 -2 20/100 -3   Dist ph cc NI NI   Correction: Glasses       Tonometry (Tonopen, 2:27 PM)      Right Left   Pressure 10 13       Pupils      Pupils Dark Light Shape React APD   Right PERRL 5 4 Round Slow None   Left PERRL 5 4 Round Slow None       Visual Fields (Counting fingers)      Left Right    Full Full       Extraocular Movement      Right Left    Full Full       Neuro/Psych    Oriented x3: Yes   Mood/Affect: Normal       Dilation    Both eyes: 1.0% Mydriacyl, 2.5% Phenylephrine @ 2:28 PM        Slit Lamp and Fundus Exam    External Exam      Right Left   External Normal Normal       Slit Lamp Exam       Right Left   Lids/Lashes Normal Normal   Conjunctiva/Sclera White and quiet White and quiet   Cornea Clear Clear   Anterior Chamber Deep and quiet Deep and quiet   Iris Round and reactive Round and reactive   Lens Posterior chamber intraocular lens Posterior chamber intraocular lens   Anterior Vitreous Normal Normal       Fundus Exam      Right Left   Posterior Vitreous Posterior vitreous detachment Posterior vitreous detachment   Disc Normal Peripapillary atrophy   C/D Ratio 0.1 0.2   Macula Hard drusen, Pigmented atrophy, no hemorrhage, no membrane, Retinal pigment epithelial mottling Subretinal neovascular membrane less active, Retinal pigment epithelial mottling,  Less Cystoid macular edema, Macular thickening, Retinal pigment epithelial atrophy, Geographic atrophy,, Disciform scar, no subretinal hemorrhage or fluid   Vessels  Normal   Periphery  Normal          IMAGING AND PROCEDURES  Imaging and Procedures for 08/15/20  OCT, Retina - OU - Both Eyes       Right Eye Quality was good. Scan locations included subfoveal. Central Foveal Thickness: 259. Findings include retinal drusen .   Left Eye Quality was good. Scan locations included subfoveal. Central Foveal Thickness: 396. Progression has improved. Findings include subretinal scarring, disciform scar, outer retinal tubulation.   Notes OD with no signs of CNVM.\  OS with subfoveal disciform scar, less intraretinal fluid and outer retinal tubulation's.Currently at 6-week follow-up. Will repeat injection today OS and examination in 8 weeks       Intravitreal Injection, Pharmacologic Agent - OS - Left Eye       Time Out 08/15/2020. 3:32 PM. Confirmed correct patient, procedure, site, and patient consented.   Anesthesia Topical anesthesia was used. Anesthetic medications included Akten 3.5%.   Procedure Preparation included Ofloxacin , 5% betadine to ocular surface, 10% betadine to eyelids. A supplied needle was  used.   Injection:  1.25 mg Bevacizumab (AVASTIN) SOLN   NDC: [address]-0, Lot: [id number]   Route: Intravitreal, Site: Left Eye, Waste: 0 mg  Post-op Post injection exam found visual acuity of at least counting fingers. The patient tolerated the procedure well. There were  no complications. The patient received written and verbal post procedure care education. Post injection medications were not given.                 ASSESSMENT/PLAN:  Intermediate stage nonexudative age-related macular degeneration of left eye The nature of age--related macular degeneration was discussed with the patient as well as the distinction between dry and wet types. Checking an [place]ler Grid daily with advice to return immediately should a distortion develop, was given to the patient. The patient 's smoking status now and in the past was determined and advice based on the AREDS study was provided regarding the consumption of antioxidant supplements. AREDS 2 vitamin formulation was recommended. Consumption of dark leafy vegetables and fresh fruits of various colors was recommended. Treatment modalities for wet macular degeneration particularly the use of intravitreal injections of anti-blood vessel growth factors was discussed with the patient. Avastin, Lucentis, and Eylea are the available options. On occasion, therapy includes the use of photodynamic therapy and thermal laser. Stressed to the patient do not rub eyes.  Patient was advised to check Amsler Grid daily and return immediately if changes are noted. Instructions on using the grid were given to the patient. All patient questions were answered.      ICD-10-CM   1. Exudative age-related macular degeneration of left eye with active choroidal neovascularization (HCC)  H35.3221 OCT, Retina - OU - Both Eyes    Intravitreal Injection, Pharmacologic Agent - OS - Left Eye    Bevacizumab (AVASTIN) SOLN 1.25 mg  2. Intermediate stage nonexudative age-related macular  degeneration of left eye  H35.3122     1.  2.  3.  Ophthalmic Meds Ordered this visit:  Meds ordered this encounter  Medications  . Bevacizumab (AVASTIN) SOLN 1.25 mg       Return in about 8 weeks (around 10/10/2020) for dilate, OS, AVASTIN OCT.  There are no Patient Instructions on file for this visit.   Explained the diagnoses, plan, and follow up with the patient and they expressed understanding.  Patient expressed understanding of the importance of proper follow up care.   Clent Demark Kanyon Seibold M.D. Diseases & Surgery of the Retina and Vitreous Retina & Diabetic [place] 08/15/20     Abbreviations: M myopia (nearsighted); A astigmatism; H hyperopia (farsighted); P presbyopia; Mrx spectacle prescription;  CTL contact lenses; OD right eye; OS left eye; OU both eyes  XT exotropia; ET esotropia; PEK punctate epithelial keratitis; PEE punctate epithelial erosions; DES dry eye syndrome; MGD meibomian gland dysfunction; ATs artificial tears; PFAT's preservative free artificial tears; [place] nuclear sclerotic cataract; PSC posterior subcapsular cataract; ERM epi-retinal membrane; PVD posterior vitreous detachment; RD retinal detachment; DM diabetes mellitus; DR diabetic retinopathy; NPDR non-proliferative diabetic retinopathy; PDR proliferative diabetic retinopathy; CSME clinically significant macular edema; DME diabetic macular edema; dbh dot blot hemorrhages; CWS cotton wool spot; POAG primary open angle glaucoma; C/D cup-to-disc ratio; HVF humphrey visual field; GVF goldmann visual field; OCT optical coherence tomography; IOP intraocular pressure; BRVO Branch retinal vein occlusion; CRVO central retinal vein occlusion; CRAO central retinal artery occlusion; BRAO branch retinal artery occlusion; RT retinal tear; SB scleral buckle; PPV pars plana vitrectomy; VH Vitreous hemorrhage; PRP panretinal laser photocoagulation; IVK intravitreal kenalog; VMT vitreomacular traction; MH Macular hole;  NVD  neovascularization of the disc; NVE neovascularization elsewhere; AREDS age related eye disease study; ARMD age related macular degeneration; POAG primary open angle glaucoma; EBMD epithelial/anterior basement membrane dystrophy; ACIOL anterior chamber intraocular lens; IOL intraocular lens; PCIOL  posterior chamber intraocular lens; Phaco/IOL phacoemulsification with intraocular lens placement; PRK photorefractive keratectomy; LASIK laser assisted in situ keratomileusis; HTN hypertension; DM diabetes mellitus; COPD chronic obstructive pulmonary disease 

## 2020-08-15 NOTE — Patient Instructions (Signed)
New onset visual acuity decline or distortions

## 2020-09-05 DIAGNOSIS — E039 Hypothyroidism, unspecified: Secondary | ICD-10-CM | POA: Diagnosis not present

## 2020-09-05 DIAGNOSIS — Z23 Encounter for immunization: Secondary | ICD-10-CM | POA: Diagnosis not present

## 2020-09-05 DIAGNOSIS — Z0001 Encounter for general adult medical examination with abnormal findings: Secondary | ICD-10-CM | POA: Diagnosis not present

## 2020-09-05 DIAGNOSIS — R002 Palpitations: Secondary | ICD-10-CM | POA: Diagnosis not present

## 2020-09-05 DIAGNOSIS — R059 Cough, unspecified: Secondary | ICD-10-CM | POA: Diagnosis not present

## 2020-09-05 DIAGNOSIS — R1032 Left lower quadrant pain: Secondary | ICD-10-CM | POA: Diagnosis not present

## 2020-09-05 DIAGNOSIS — I119 Hypertensive heart disease without heart failure: Secondary | ICD-10-CM | POA: Diagnosis not present

## 2020-09-16 DIAGNOSIS — B349 Viral infection, unspecified: Secondary | ICD-10-CM | POA: Diagnosis not present

## 2020-09-19 ENCOUNTER — Inpatient Hospital Stay (HOSPITAL_COMMUNITY)
Admission: EM | Admit: 2020-09-19 | Discharge: 2020-09-22 | DRG: 640 | Disposition: A | Discharge status: Home Health | Payer: Medicare Other | Attending: Internal Medicine | Admitting: Internal Medicine

## 2020-09-19 ENCOUNTER — Encounter (HOSPITAL_COMMUNITY): Payer: Self-pay | Admitting: Emergency Medicine

## 2020-09-19 ENCOUNTER — Other Ambulatory Visit: Payer: Self-pay

## 2020-09-19 DIAGNOSIS — H3530 Unspecified macular degeneration: Secondary | ICD-10-CM | POA: Diagnosis present

## 2020-09-19 DIAGNOSIS — T502X5A Adverse effect of carbonic-anhydrase inhibitors, benzothiadiazides and other diuretics, initial encounter: Secondary | ICD-10-CM | POA: Diagnosis present

## 2020-09-19 DIAGNOSIS — Z833 Family history of diabetes mellitus: Secondary | ICD-10-CM

## 2020-09-19 DIAGNOSIS — N390 Urinary tract infection, site not specified: Secondary | ICD-10-CM | POA: Diagnosis not present

## 2020-09-19 DIAGNOSIS — I959 Hypotension, unspecified: Secondary | ICD-10-CM

## 2020-09-19 DIAGNOSIS — E039 Hypothyroidism, unspecified: Secondary | ICD-10-CM | POA: Diagnosis present

## 2020-09-19 DIAGNOSIS — Z20822 Contact with and (suspected) exposure to covid-19: Secondary | ICD-10-CM | POA: Diagnosis present

## 2020-09-19 DIAGNOSIS — G9341 Metabolic encephalopathy: Secondary | ICD-10-CM | POA: Diagnosis present

## 2020-09-19 DIAGNOSIS — E785 Hyperlipidemia, unspecified: Secondary | ICD-10-CM | POA: Diagnosis present

## 2020-09-19 DIAGNOSIS — I10 Essential (primary) hypertension: Secondary | ICD-10-CM | POA: Diagnosis not present

## 2020-09-19 DIAGNOSIS — E876 Hypokalemia: Principal | ICD-10-CM | POA: Diagnosis present

## 2020-09-19 DIAGNOSIS — R42 Dizziness and giddiness: Secondary | ICD-10-CM | POA: Diagnosis not present

## 2020-09-19 DIAGNOSIS — Z880 Allergy status to penicillin: Secondary | ICD-10-CM | POA: Diagnosis not present

## 2020-09-19 DIAGNOSIS — F32A Depression, unspecified: Secondary | ICD-10-CM | POA: Diagnosis not present

## 2020-09-19 DIAGNOSIS — E871 Hypo-osmolality and hyponatremia: Secondary | ICD-10-CM | POA: Diagnosis present

## 2020-09-19 DIAGNOSIS — R197 Diarrhea, unspecified: Secondary | ICD-10-CM | POA: Diagnosis not present

## 2020-09-19 DIAGNOSIS — E860 Dehydration: Secondary | ICD-10-CM | POA: Diagnosis present

## 2020-09-19 DIAGNOSIS — R531 Weakness: Secondary | ICD-10-CM | POA: Diagnosis not present

## 2020-09-19 LAB — BASIC METABOLIC PANEL
Anion gap: 11 (ref 5–15)
BUN: 13 mg/dL (ref 8–23)
CO2: 24 mmol/L (ref 22–32)
Calcium: 8.5 mg/dL — ABNORMAL LOW (ref 8.9–10.3)
Chloride: 80 mmol/L — ABNORMAL LOW (ref 98–111)
Creatinine, Ser: 0.75 mg/dL (ref 0.44–1.00)
GFR, Estimated: >60 mL/min (ref >60)
Glucose, Bld: 108 mg/dL — ABNORMAL HIGH (ref 70–99)
Potassium: 2.7 mmol/L — CL (ref 3.5–5.1)
Sodium: 115 mmol/L — CL (ref 135–145)

## 2020-09-19 LAB — OSMOLALITY, URINE: Osmolality, Ur: 315 mOsm/kg (ref 300–900)

## 2020-09-19 LAB — URINALYSIS, ROUTINE W REFLEX MICROSCOPIC
Bilirubin Urine: NEGATIVE
Glucose, UA: NEGATIVE mg/dL
Ketones, ur: 5 mg/dL — AB
Nitrite: NEGATIVE
Protein, ur: 30 mg/dL — AB
Specific Gravity, Urine: 1.01 (ref 1.005–1.030)
WBC, UA: >50 WBC/hpf — ABNORMAL HIGH (ref 0–5)
pH: 6 (ref 5.0–8.0)

## 2020-09-19 LAB — RESPIRATORY PANEL BY RT PCR (FLU A&B, COVID)
Influenza A by PCR: NEGATIVE
Influenza B by PCR: NEGATIVE
SARS Coronavirus 2 by RT PCR: NEGATIVE

## 2020-09-19 LAB — CBC
HCT: 30.2 % — ABNORMAL LOW (ref 36.0–46.0)
Hemoglobin: 11.3 g/dL — ABNORMAL LOW (ref 12.0–15.0)
MCH: 31.4 pg (ref 26.0–34.0)
MCHC: 37.4 g/dL — ABNORMAL HIGH (ref 30.0–36.0)
MCV: 83.9 fL (ref 80.0–100.0)
Platelets: 325 10*3/uL (ref 150–400)
RBC: 3.6 MIL/uL — ABNORMAL LOW (ref 3.87–5.11)
RDW: 11.9 % (ref 11.5–15.5)
WBC: 7.3 10*3/uL (ref 4.0–10.5)
nRBC: 0 % (ref 0.0–0.2)

## 2020-09-19 LAB — SODIUM, URINE, RANDOM: Sodium, Ur: 21 mmol/L

## 2020-09-19 LAB — CBG MONITORING, ED: Glucose-Capillary: 97 mg/dL (ref 70–99)

## 2020-09-19 MED ORDER — ACETAMINOPHEN 325 MG PO TABS: 650.0000 mg | ORAL_TABLET | Freq: Four times a day (QID) | ORAL | Status: DC | PRN

## 2020-09-19 MED ORDER — LEVOTHYROXINE SODIUM 75 MCG PO TABS
75.0000 ug | ORAL_TABLET | Freq: Every day | ORAL | Status: DC
  Administered 2020-09-20 – 2020-09-22 (×3): 75 ug via ORAL
  Filled 2020-09-19 (×3): qty 1

## 2020-09-19 MED ORDER — ENOXAPARIN SODIUM 40 MG/0.4ML ~~LOC~~ SOLN
40.0000 mg | SUBCUTANEOUS | Status: DC
  Administered 2020-09-19 – 2020-09-21 (×3): 40 mg via SUBCUTANEOUS
  Filled 2020-09-19 (×3): qty 0.4

## 2020-09-19 MED ORDER — SODIUM CHLORIDE 0.9% FLUSH
3.0000 mL | Freq: Two times a day (BID) | INTRAVENOUS | Status: DC
  Administered 2020-09-19 – 2020-09-22 (×6): 3 mL via INTRAVENOUS

## 2020-09-19 MED ORDER — SODIUM CHLORIDE 0.9 % IV BOLUS: 250.0000 mL | Freq: Once | INTRAVENOUS | Status: DC

## 2020-09-19 MED ORDER — POTASSIUM CHLORIDE 10 MEQ/100ML IV SOLN
10.0000 meq | INTRAVENOUS | Status: AC
  Administered 2020-09-19 – 2020-09-20 (×4): 10 meq via INTRAVENOUS
  Filled 2020-09-19 (×4): qty 100

## 2020-09-19 MED ORDER — ACETAMINOPHEN 650 MG RE SUPP: 650.0000 mg | Freq: Four times a day (QID) | RECTAL | Status: DC | PRN

## 2020-09-19 MED ORDER — SODIUM CHLORIDE 0.9 % IV BOLUS
1000.0000 mL | Freq: Once | INTRAVENOUS | Status: AC
  Administered 2020-09-19: 1000 mL via INTRAVENOUS

## 2020-09-19 NOTE — ED Provider Notes (Signed)
[facility] Provider Note   CSN: [id number] Arrival date & time: 09/19/20  1646     History Chief Complaint  Patient presents with  . Weakness    Kimberly Maynard is a 86 y.o. female.  Pt's daughter April Honeychurch reports pt was seen in [place] at a hospital and diagnosed with a viral illness last week.  Pt was vomitting on Saturday.  Pt has not been eating or drinking since.  EMS called this am at 3am.  Pt given a liter of IV fluids.  Daughter reports no improvement  The history is provided by the patient and a caregiver. No language interpreter was used.  Weakness Severity:  Severe Onset quality:  Gradual Timing:  Constant Progression:  Worsening Chronicity:  New Relieved by:  Nothing Worsened by:  Nothing Ineffective treatments:  None tried Associated symptoms: no shortness of breath   Risk factors: no new medications        Past Medical History:  Diagnosis Date  . ANEMIA 01/15/2008   resolved  . ANEURYSM NOS 08/30/2006   offered imaging to assess stability, no guidelines for this, she declined  . DEPRESSION 05/05/2007  . FASTING HYPERGLYCEMIA 05/05/2007  . HYPERLIPIDEMIA 01/15/2008  . HYPOTHYROIDISM 08/30/2006  . OSTEOPENIA 08/30/2006  . 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 Skin cancer   . Splenic artery aneurysm - last CT 2012 stable 08/16/2014    Patient Active Problem List   Diagnosis Date Noted  . Intermediate stage nonexudative age-related macular degeneration of left eye 08/15/2020  . Exudative age-related macular degeneration of left eye with active choroidal neovascularization ([place]) 03/21/2020  . Retinal hemorrhage of left eye 03/21/2020  . Osteoarthritis 08/16/2014  . Hyperlipemia 01/15/2008  . Hypothyroidism 08/30/2006  . OSTEOPENIA 08/30/2006    Past Surgical History:  Procedure Laterality Date  . ABDOMINAL HYSTERECTOMY    . OOPHORECTOMY       OB History   No  obstetric history on file.     Family History  Problem Relation Age of Onset  . Diabetes Father   . Diabetes Sister   . Cancer Brother        pancreatic, prostate  . Diabetes Brother     Social History   Tobacco Use  . Smoking status: Never Smoker  . Smokeless tobacco: Never Used  Substance Use Topics  . Alcohol use: No  . Drug use: No    Home Medications Prior to Admission medications   Medication Sig Start Date End Date Taking? Authorizing Provider  acetaminophen (TYLENOL) 650 MG CR tablet Take 1 tablet (650 mg total) by mouth as needed for pain. 08/11/18   Lucretia Kern, DO  benzonatate (TESSALON PERLES) 100 MG capsule Take 1 capsule (100 mg total) by mouth 3 (three) times daily as needed. 12/18/18   Lucretia Kern, DO  DULoxetine (CYMBALTA) 20 MG capsule Take 1 capsule (20 mg total) by mouth daily. 06/29/19   Caren Macadam, MD  EUTHYROX 75 MCG tablet Take 1 tablet by mouth once daily 01/25/20   Koberlein, Steele Berg, MD  fluticasone (FLONASE) 50 MCG/ACT nasal spray Place 2 sprays into both nostrils daily. 08/12/17   Lucretia Kern, DO  Magnesium-Zinc (MAGNESIUM-CHELATED ZINC PO) Take 1 tablet by mouth daily.     [provider]  moxifloxacin (VIGAMOX) 0.5 % ophthalmic solution Place 1 drop into the right eye 4 (four) times daily. Patient not taking: Reported on 08/15/2020 04/12/20  [provider]  sodium chloride (AYR) 0.65 % nasal spray Place 1 spray into the nose as needed for congestion.    [provider]    Allergies    Carisoprodol, Cyclobenzaprine hcl, and Penicillins  Review of Systems   Review of Systems  Unable to perform ROS: Dementia  Respiratory: Negative for shortness of breath.   Neurological: Positive for weakness.    Physical Exam Updated Vital Signs BP (!) 101/49 (BP Location: Left Arm)   Pulse 66   Temp 97.9 F (36.6 C) (Oral)   Resp 15   SpO2 97%   Physical Exam Vitals and nursing note reviewed.  Constitutional:       Appearance: She is well-developed.  HENT:     Head: Normocephalic.  Eyes:     Pupils: Pupils are equal, round, and reactive to light.  Cardiovascular:     Rate and Rhythm: Normal rate and regular rhythm.     Pulses: Normal pulses.  Pulmonary:     Effort: Pulmonary effort is normal.  Abdominal:     General: Abdomen is flat. There is no distension.  Musculoskeletal:        General: Normal range of motion.     Cervical back: Normal range of motion.  Skin:    General: Skin is warm.  Neurological:     Mental Status: She is alert and oriented to person, place, and time.  Psychiatric:        Mood and Affect: Mood normal.     ED Results / Procedures / Treatments   Labs (all labs ordered are listed, but only abnormal results are displayed) Labs Reviewed  BASIC METABOLIC PANEL - Abnormal; Notable for the following components:      Result Value   Sodium 115 (*)    Potassium 2.7 (*)    Chloride 80 (*)    Glucose, Bld 108 (*)    Calcium 8.5 (*)    All other components within normal limits  CBC - Abnormal; Notable for the following components:   RBC 3.60 (*)    Hemoglobin 11.3 (*)    HCT 30.2 (*)    MCHC 37.4 (*)    All other components within normal limits  URINALYSIS, ROUTINE W REFLEX MICROSCOPIC  CBG MONITORING, ED    EKG None  Radiology No results found.  Procedures Procedures (including critical care time)  Medications Ordered in ED Medications  sodium chloride 0.9 % bolus 1,000 mL (has no administration in time range)  potassium chloride 10 mEq in 100 mL IVPB (has no administration in time range)    ED Course  I have reviewed the triage vital signs and the nursing notes.  Pertinent labs & imaging results that were available during my care of the patient were reviewed by me and considered in my medical decision making (see chart for details).    MDM Rules/Calculators/A&P                          MDM:  Potassium 2.7  Sodium 115.   I spoke with  Hospitalist who will admit. Final Clinical Impression(s) / ED Diagnoses Final diagnoses:  Hypokalemia  Hyponatremia  Hypotension, unspecified hypotension type    Rx / DC Orders ED Discharge Orders    None       Sidney Ace 09/19/20 2102    Gareth Morgan, MD 09/22/20 1510

## 2020-09-19 NOTE — H&P (Addendum)
History and Physical   Kimberly Maynard YBO:[id number] DOB: [birth year] DOA: 09/19/2020  PCP: Jilda Panda, MD   Patient coming from: Home  Chief Complaint: Weakness, decreased appetite  HPI: Kimberly Maynard is a 86 y.o. female with medical history significant of hypothyroidism, high cholesterol, macular degeneration who presents with worsening weakness and decreased appetite.  Patient is lethargic and history obtained with the assistance of her daughter who is at bedside.  Patient went to go visit another daughter in [place] last Friday and began to feel unwell on Wednesday, 5 days ago.  She was seen by a provider in [place] and diagnosed with a viral illness, was not tested for COVID-19 at that time.  She has continued to do poorly since returning home on Saturday and has had very little to eat or drink along with becoming progressively weaker.  She also has had no BM or urine output for least a day.  She was seen by EMS in the early hours of this morning and provided with a 500 cc to 1 L bolus, with improvement and no transportation.  She thin worsened later in the day and presented to the ED. no reported fever.  ED Course: Vital signs initially stable in ED, soft blood pressure in the 90s was noted initially.  Laboratory work-up significant for hyponatremia to 115 and potassium of 2.7.  Hemoglobin stable at 11.3.  UA and respiratory panel ordered.  Patient was receiving bolus in the ED which was stopped after about half completion.  She was also ordered 6 rounds of potassium by IV.  Review of Systems: Unable to obtain full review of systems due to patient's confusion.  Past Medical History:  Diagnosis Date  . ANEMIA 01/15/2008   resolved  . ANEURYSM NOS 08/30/2006   offered imaging to assess stability, no guidelines for this, she declined  . DEPRESSION 05/05/2007  . FASTING HYPERGLYCEMIA 05/05/2007  . HYPERLIPIDEMIA 01/15/2008  . HYPOTHYROIDISM 08/30/2006  . OSTEOPENIA 08/30/2006  .  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 Skin cancer   . Splenic artery aneurysm - last CT 2012 stable 08/16/2014    Past Surgical History:  Procedure Laterality Date  . ABDOMINAL HYSTERECTOMY    . OOPHORECTOMY      Social History  reports that she has never smoked. She has never used smokeless tobacco. She reports that she does not drink alcohol and does not use drugs.  Allergies  Allergen Reactions  . Carisoprodol Other (See Comments)    Unknown reaction  . Cyclobenzaprine Hcl Other (See Comments)    Unknown reaction  . Penicillins Itching    Family History  Problem Relation Age of Onset  . Diabetes Father   . Diabetes Sister   . Cancer Brother        pancreatic, prostate  . Diabetes Brother     Prior to Admission medications   Medication Sig Start Date End Date Taking? Authorizing Provider  acetaminophen (TYLENOL) 650 MG CR tablet Take 1 tablet (650 mg total) by mouth as needed for pain. 08/11/18   Lucretia Kern, DO  benzonatate (TESSALON PERLES) 100 MG capsule Take 1 capsule (100 mg total) by mouth 3 (three) times daily as needed. 12/18/18   Lucretia Kern, DO  DULoxetine (CYMBALTA) 20 MG capsule Take 1 capsule (20 mg total) by mouth daily. 06/29/19   Caren Macadam, MD  EUTHYROX 75 MCG tablet Take 1 tablet by mouth once daily 01/25/20  Koberlein, Junell C, MD  fluticasone (FLONASE) 50 MCG/ACT nasal spray Place 2 sprays into both nostrils daily. 08/12/17   Lucretia Kern, DO  Magnesium-Zinc (MAGNESIUM-CHELATED ZINC PO) Take 1 tablet by mouth daily.     [provider]  moxifloxacin (VIGAMOX) 0.5 % ophthalmic solution Place 1 drop into the right eye 4 (four) times daily. Patient not taking: Reported on 08/15/2020 04/12/20   [provider]  sodium chloride (AYR) 0.65 % nasal spray Place 1 spray into the nose as needed for congestion.    [provider]    Physical Exam: Vitals:   09/19/20 2130 09/19/20 2200 09/19/20  2230 09/19/20 2300  BP: (!) 112/52 124/70 (!) 108/44 (!) 113/50  Pulse: 65 70 61 63  Resp: 20 (!) 21 19 18   Temp:      TempSrc:      SpO2: 98% 97% 98% 97%    Physical Exam Constitutional:      General: She is not in acute distress.    Appearance: Normal appearance.     Comments: Elderly female  HENT:     Head: Normocephalic and atraumatic.     Mouth/Throat:     Mouth: Mucous membranes are moist.     Pharynx: Oropharynx is clear.  Eyes:     Extraocular Movements: Extraocular movements intact.     Pupils: Pupils are equal, round, and reactive to light.  Cardiovascular:     Rate and Rhythm: Normal rate and regular rhythm.     Pulses: Normal pulses.     Heart sounds: Normal heart sounds.  Pulmonary:     Effort: Pulmonary effort is normal. No respiratory distress.     Breath sounds: Normal breath sounds.  Abdominal:     General: Bowel sounds are normal. There is no distension.     Palpations: Abdomen is soft.     Tenderness: There is no abdominal tenderness.  Musculoskeletal:        General: No swelling or deformity.  Skin:    General: Skin is warm and dry.  Neurological:     General: No focal deficit present.     Comments: Alert and oriented x2    Labs on Admission: I have personally reviewed following labs and imaging studies  CBC: Recent Labs  Lab 09/19/20 1751  WBC 7.3  HGB 11.3*  HCT 30.2*  MCV 83.9  PLT 229    Basic Metabolic Panel: Recent Labs  Lab 09/19/20 1751  NA 115*  K 2.7*  CL 80*  CO2 24  GLUCOSE 108*  BUN 13  CREATININE 0.75  CALCIUM 8.5*    GFR: CrCl cannot be calculated (Unknown ideal weight.).  Liver Function Tests: No results for input(s): AST, ALT, ALKPHOS, BILITOT, PROT, ALBUMIN in the last 168 hours.  Urine analysis:    Component Value Date/Time   COLORURINE YELLOW 09/19/2020 2046   APPEARANCEUR CLOUDY (A) 09/19/2020 2046   LABSPEC 1.010 09/19/2020 2046   PHURINE 6.0 09/19/2020 2046   GLUCOSEU NEGATIVE 09/19/2020 2046    HGBUR MODERATE (A) 09/19/2020 2046   HGBUR negative 01/15/2008 0958   BILIRUBINUR NEGATIVE 09/19/2020 2046   BILIRUBINUR 1+ 01/01/2011 0945   KETONESUR 5 (A) 09/19/2020 2046   PROTEINUR 30 (A) 09/19/2020 2046   UROBILINOGEN 1.0 01/01/2011 0945   UROBILINOGEN 2.0 (H) 11/29/2010 0140   NITRITE NEGATIVE 09/19/2020 2046   LEUKOCYTESUR LARGE (A) 09/19/2020 2046    Radiological Exams on Admission: No results found.  EKG: Independently reviewed.  Normal sinus rhythm,  left bundle branch block  Assessment/Plan Principal Problem:   Hyponatremia Active Problems:   Hypothyroidism   Hypokalemia   Hyponatremia Hypokalemia > In the setting of decreased p.o. intake for several days, suspect hypervolemic hyponatremia > Lethargy with confusion she is alert to person and that she is in the hospital but not year (baseline A&Ox3) > Received a bolus by EMS this morning, daughter states it seemed to be the same size of the 1 L bag in the ED > Received part of a bolus in ED but this was stopped and now is only receiving the 100 cc an hour required for potassium infusion > We will want to avoid greater than 6-8 increase in sodium over the first 24 hours, it is possible she has already had an increase in her sodium given bolus received by EMS, will be cautious and increase at slow rate - Admit to progressive - We will immediately repeat BMP to see if rate increased with ED fluids - Continue IV potassium for total of 6 runs - Add on magnesium - BMP every 3 hours - Serum osm, - Urine osm, urine sodium  Hypothyroidism -Continue on Synthroid  DVT prophylaxis: Lovenox  Code Status:   Full  Family Communication:  Daughter updated at bedside  Disposition Plan:   Patient is from:  Home  Anticipated DC to:  Pending clinical course  Anticipated DC date:  Pending clinical course    Consults called:  None  Admission status:  Inpatient, progressive   Severity of Illness: The appropriate patient  status for this patient is INPATIENT. Inpatient status is judged to be reasonable and necessary in order to provide the required intensity of service to ensure the patient's safety. The patient's presenting symptoms, physical exam findings, and initial radiographic and laboratory data in the context of their chronic comorbidities is felt to place them at high risk for further clinical deterioration. Furthermore, it is not anticipated that the patient will be medically stable for discharge from the hospital within 2 midnights of admission. The following factors support the patient status of inpatient.   " The patient's presenting symptoms include lethargy, confusion. " The worrisome physical exam findings include soft BP, confusion. " The initial radiographic and laboratory data are worrisome because of severe-knee tremulous, hypokalemia. " The chronic co-morbidities include hypothyroidism.   * I certify that at the point of admission it is my clinical judgment that the patient will require inpatient hospital care spanning beyond 2 midnights from the point of admission due to high intensity of service, high risk for further deterioration and high frequency of surveillance required.Marcelyn Bruins MD [facility]  How to contact the [facility] Attending or Consulting provider [place] or covering provider during after hours [place], for this patient?   1. Check the care team in [facility] and look for a) attending/consulting [facility] provider listed and b) the [facility] team listed 2. Log into [email or web address] and use [place]'s universal password to access. If you do not have the password, please contact the hospital operator. 3. Locate the [facility] provider you are looking for under [facility] and page to a number that you can be directly reached. 4. If you still have difficulty reaching the provider, please page the [facility] (Director on Call) for the Hospitalists listed on amion for assistance.  09/20/2020,  12:02 AM

## 2020-09-19 NOTE — ED Triage Notes (Signed)
Patient arrives to ED with complaints of increased weakness x3 weeks. Pt states that she has not been eating or drinking. Pt denies pain but states she has not been urinating or had a BM in awhile. Pt living with daughters. Alert and Oriented x2.

## 2020-09-20 DIAGNOSIS — N390 Urinary tract infection, site not specified: Secondary | ICD-10-CM | POA: Diagnosis present

## 2020-09-20 DIAGNOSIS — I10 Essential (primary) hypertension: Secondary | ICD-10-CM | POA: Diagnosis present

## 2020-09-20 DIAGNOSIS — G9341 Metabolic encephalopathy: Secondary | ICD-10-CM | POA: Diagnosis present

## 2020-09-20 DIAGNOSIS — E876 Hypokalemia: Secondary | ICD-10-CM | POA: Diagnosis present

## 2020-09-20 DIAGNOSIS — E039 Hypothyroidism, unspecified: Secondary | ICD-10-CM

## 2020-09-20 LAB — BASIC METABOLIC PANEL
Anion gap: 9 (ref 5–15)
Anion gap: 7 (ref 5–15)
Anion gap: 9 (ref 5–15)
BUN: 11 mg/dL (ref 8–23)
BUN: 11 mg/dL (ref 8–23)
BUN: 9 mg/dL (ref 8–23)
CO2: 22 mmol/L (ref 22–32)
CO2: 25 mmol/L (ref 22–32)
CO2: 21 mmol/L — ABNORMAL LOW (ref 22–32)
Calcium: 8 mg/dL — ABNORMAL LOW (ref 8.9–10.3)
Calcium: 8 mg/dL — ABNORMAL LOW (ref 8.9–10.3)
Calcium: 8 mg/dL — ABNORMAL LOW (ref 8.9–10.3)
Chloride: 85 mmol/L — ABNORMAL LOW (ref 98–111)
Chloride: 86 mmol/L — ABNORMAL LOW (ref 98–111)
Chloride: 93 mmol/L — ABNORMAL LOW (ref 98–111)
Creatinine, Ser: 0.7 mg/dL (ref 0.44–1.00)
Creatinine, Ser: 0.71 mg/dL (ref 0.44–1.00)
Creatinine, Ser: 0.77 mg/dL (ref 0.44–1.00)
GFR, Estimated: >60 mL/min (ref >60)
GFR, Estimated: >60 mL/min (ref >60)
GFR, Estimated: >60 mL/min (ref >60)
Glucose, Bld: 86 mg/dL (ref 70–99)
Glucose, Bld: 87 mg/dL (ref 70–99)
Glucose, Bld: 82 mg/dL (ref 70–99)
Potassium: 3.2 mmol/L — ABNORMAL LOW (ref 3.5–5.1)
Potassium: 2.9 mmol/L — ABNORMAL LOW (ref 3.5–5.1)
Potassium: 4.2 mmol/L (ref 3.5–5.1)
Sodium: 116 mmol/L — CL (ref 135–145)
Sodium: 118 mmol/L — CL (ref 135–145)
Sodium: 123 mmol/L — ABNORMAL LOW (ref 135–145)

## 2020-09-20 LAB — MAGNESIUM: Magnesium: 1.7 mg/dL (ref 1.7–2.4)

## 2020-09-20 LAB — OSMOLALITY: Osmolality: 244 mOsm/kg — CL (ref 275–295)

## 2020-09-20 LAB — TSH: TSH: 2.514 u[IU]/mL (ref 0.350–4.500)

## 2020-09-20 MED ORDER — POTASSIUM CHLORIDE 10 MEQ/100ML IV SOLN
10.0000 meq | INTRAVENOUS | Status: AC
  Administered 2020-09-20 (×3): 10 meq via INTRAVENOUS
  Filled 2020-09-20 (×4): qty 100

## 2020-09-20 MED ORDER — MAGNESIUM SULFATE 2 GM/50ML IV SOLN
2.0000 g | Freq: Once | INTRAVENOUS | Status: AC
  Administered 2020-09-20: 2 g via INTRAVENOUS
  Filled 2020-09-20: qty 50

## 2020-09-20 MED ORDER — SODIUM CHLORIDE 0.9 % IV SOLN
1.0000 g | INTRAVENOUS | Status: DC
  Administered 2020-09-20 – 2020-09-21 (×2): 1 g via INTRAVENOUS
  Filled 2020-09-20 (×3): qty 10

## 2020-09-20 MED ORDER — SODIUM CHLORIDE 0.9 % IV SOLN: INTRAVENOUS | Status: DC

## 2020-09-20 MED ORDER — POTASSIUM CHLORIDE CRYS ER 20 MEQ PO TBCR
40.0000 meq | EXTENDED_RELEASE_TABLET | Freq: Once | ORAL | Status: AC
  Administered 2020-09-20: 40 meq via ORAL
  Filled 2020-09-20: qty 2

## 2020-09-20 NOTE — ED Notes (Signed)
Please call Lewis Moccasin with an update [phone]

## 2020-09-20 NOTE — Progress Notes (Signed)
HOSPITAL MEDICINE OVERNIGHT EVENT NOTE    Notified by nursing that serial sodium for this patient is 116, up only slightly from 115.  Will place patient back on sodium chloride infusion of 75 cc an hour, continue to follow serial sodium levels closely.  Vernelle Emerald  MD [facility]

## 2020-09-20 NOTE — ED Notes (Addendum)
Date and time results received: 09/20/20 0356  Test: Na Critical Value: 116  Test: Serum Os Critical Value: 244  Name of Provider Notified: Shaloub  Orders Received? Or Actions Taken?: ns started at 37ml/hr

## 2020-09-20 NOTE — Progress Notes (Signed)
PROGRESS NOTE  Kimberly Maynard JXB:[id number] DOB: [birth year] DOA: 09/19/2020 PCP: Jilda Panda, MD  HPI/Recap of past 24 hours: Patient is a 86 year old female past history of hypothyroidism, hyperlipidemia Degeneration who was brought in by her daughter on the evening of 10/25 with complaints of weakness, decreased appetite and some confusion. Symptoms had started approximately 5 days prior, and patient had developed what was thought to be a viral illness, not felt to be Covid. Since then, patient's appetite has been very minimal and she has become progressively weaker. Seen by EMS in the early hours of 10/25 and given a 500 cc fluid bolus. At that time, she was not brought in, but when symptoms continue to persist, she was brought into the emergency room. Emergency room, noted to have a sodium of 115 and potassium of 2.7. Patient was admitted to the hospitalist service and started on gentle fluid resuscitation as well as potassium replacement.  Over the next 24 hours, potassium of 6 normalized and sodium has slowly improved, currently at 123. Rest the patient's labs were unremarkable on admission including hemoglobin, white blood cell count. She is a bit more awake and interactive. Complains of just feeling very tired. Still slightly confused, but appears to be more lucid than when she first came in.  Assessment/Plan: Principal Problem:   Hyponatremia: UTI may have had a possible factor in this as well as poor p.o. intake. Responding to gentle fluids. Continue to follow sodium. Of note, she is also on a combination ARB/HCTZ antihypertensive agent. Would recommend changing this over to ARB only as her HCTZ can certainly cause hyponatremia.  Active Problems:   Hypothyroidism: Continue Synthroid. TSH is normal.    Depression: Continue SSRI.    Hypokalemia: Resolved with potassium replacement. Secondary to poor p.o. intake.    Acute metabolic encephalopathy Urinary tract infection: Noted to  have more than 50 white blood cells and large leukocytes, but nitrite negative. Given that she looks to be hypovolemic, she may have had a mild urinary infection so we will go ahead and treat with 3 days of antibiotics.  Code Status: Full code  Family Communication: Updated daughter by phone  Disposition Plan: Potential discharge in the next 1 to 2 days once sodium normalized.   Consultants:  None none  Procedures:  None   Antimicrobials:  IV Rocephin 10/26-present  DVT prophylaxis: Lovenox   Objective: Vitals:   09/20/20 1415 09/20/20 1459  BP: 111/61 (!) 130/56  Pulse: 68 68  Resp: 15 14  Temp:  98.3 F (36.8 C)  SpO2: 97% 100%    Intake/Output Summary (Last 24 hours) at 09/20/2020 1750 Last data filed at 09/20/2020 0300 Gross per 24 hour  Intake 1550 ml  Output 300 ml  Net 1250 ml   There were no vitals filed for this visit. There is no height or weight on file to calculate BMI.  Exam:   General: Oriented x2, fatigued  HEENT: Normocephalic and atraumatic, mucous membranes are slightly dry  Cardiovascular: Regular rate and rhythm, S1-S2  Respiratory: Clear to auscultation bilaterally  Abdomen: Soft, nontender, nondistended, positive bowel sounds  Musculoskeletal: No clubbing or cyanosis or edema  Skin: No skin breaks, tears or lesions  Psychiatry: Appropriate, no evidence of psychoses   Data Reviewed: CBC: Recent Labs  Lab 09/19/20 1751  WBC 7.3  HGB 11.3*  HCT 30.2*  MCV 83.9  PLT 130   Basic Metabolic Panel: Recent Labs  Lab 09/19/20 1751 09/20/20 0307 09/20/20 0559 09/20/20 1400  NA 115* 116* 118* 123*  K 2.7* 3.2* 2.9* 4.2  CL 80* 85* 86* 93*  CO2 24 22 25  21*  GLUCOSE 108* 86 87 82  BUN 13 11 11 9   CREATININE 0.75 0.70 0.71 0.77  CALCIUM 8.5* 8.0* 8.0* 8.0*  MG  --  1.7  --   --    GFR: CrCl cannot be calculated (Unknown ideal weight.). Liver Function Tests: No results for input(s): AST, ALT, ALKPHOS, BILITOT,  PROT, ALBUMIN in the last 168 hours. No results for input(s): LIPASE, AMYLASE in the last 168 hours. No results for input(s): AMMONIA in the last 168 hours. Coagulation Profile: No results for input(s): INR, PROTIME in the last 168 hours. Cardiac Enzymes: No results for input(s): CKTOTAL, CKMB, CKMBINDEX, TROPONINI in the last 168 hours. BNP (last 3 results) No results for input(s): PROBNP in the last 8760 hours. HbA1C: No results for input(s): HGBA1C in the last 72 hours. CBG: Recent Labs  Lab 09/19/20 2025  GLUCAP 97   Lipid Profile: No results for input(s): CHOL, HDL, LDLCALC, TRIG, CHOLHDL, LDLDIRECT in the last 72 hours. Thyroid Function Tests: Recent Labs    09/20/20 0307  TSH 2.514   Anemia Panel: No results for input(s): VITAMINB12, FOLATE, FERRITIN, TIBC, IRON, RETICCTPCT in the last 72 hours. Urine analysis:    Component Value Date/Time   COLORURINE YELLOW 09/19/2020 2046   APPEARANCEUR CLOUDY (A) 09/19/2020 2046   LABSPEC 1.010 09/19/2020 2046   PHURINE 6.0 09/19/2020 2046   GLUCOSEU NEGATIVE 09/19/2020 2046   HGBUR MODERATE (A) 09/19/2020 2046   HGBUR negative 01/15/2008 0958   BILIRUBINUR NEGATIVE 09/19/2020 2046   BILIRUBINUR 1+ 01/01/2011 0945   KETONESUR 5 (A) 09/19/2020 2046   PROTEINUR 30 (A) 09/19/2020 2046   UROBILINOGEN 1.0 01/01/2011 0945   UROBILINOGEN 2.0 (H) 11/29/2010 0140   NITRITE NEGATIVE 09/19/2020 2046   LEUKOCYTESUR LARGE (A) 09/19/2020 2046   Sepsis Labs: @LABRCNTIP (procalcitonin:4,lacticidven:4)  ) Recent Results (from the past 240 hour(s))  Respiratory Panel by RT PCR (Flu A&B, Covid) - Nasopharyngeal Swab     Status: None   Collection Time: 09/19/20  8:31 PM   Specimen: Nasopharyngeal Swab  Result Value Ref Range Status   SARS Coronavirus 2 by RT PCR NEGATIVE NEGATIVE Final    Comment: (NOTE) SARS-CoV-2 target nucleic acids are NOT DETECTED.  The SARS-CoV-2 RNA is generally detectable in upper respiratoy specimens during  the acute phase of infection. The lowest concentration of SARS-CoV-2 viral copies this assay can detect is 131 copies/mL. A negative result does not preclude SARS-Cov-2 infection and should not be used as the sole basis for treatment or other patient management decisions. A negative result may occur with  improper specimen collection/handling, submission of specimen other than nasopharyngeal swab, presence of viral mutation(s) within the areas targeted by this assay, and inadequate number of viral copies (<131 copies/mL). A negative result must be combined with clinical observations, patient history, and epidemiological information. The expected result is Negative.  Fact Sheet for Patients:  PinkCheek.be  Fact Sheet for Healthcare Providers:  GravelBags.it  This test is no t yet approved or cleared by the [place] FDA and  has been authorized for detection and/or diagnosis of SARS-CoV-2 by FDA under an Emergency Use Authorization (EUA). This EUA will remain  in effect (meaning this test can be used) for the duration of the COVID-19 declaration under Section 564(b)(1) of the Act, 21 U.S.C. section 360bbb-3(b)(1), unless the authorization is terminated or revoked sooner.  Influenza A by PCR NEGATIVE NEGATIVE Final   Influenza B by PCR NEGATIVE NEGATIVE Final    Comment: (NOTE) The Xpert Xpress SARS-CoV-2/FLU/RSV assay is intended as an aid in  the diagnosis of influenza from Nasopharyngeal swab specimens and  should not be used as a sole basis for treatment. Nasal washings and  aspirates are unacceptable for Xpert Xpress SARS-CoV-2/FLU/RSV  testing.  Fact Sheet for Patients: PinkCheek.be  Fact Sheet for Healthcare Providers: GravelBags.it  This test is not yet approved or cleared by the [place] FDA and  has been authorized for detection and/or  diagnosis of SARS-CoV-2 by  FDA under an Emergency Use Authorization (EUA). This EUA will remain  in effect (meaning this test can be used) for the duration of the  Covid-19 declaration under Section 564(b)(1) of the Act, 21  U.S.C. section 360bbb-3(b)(1), unless the authorization is  terminated or revoked. Performed at [facility], [place] [address], [place],  [address]       Studies: No results found.  Scheduled Meds: . enoxaparin (LOVENOX) injection  40 mg Subcutaneous Q24H  . levothyroxine  75 mcg Oral Daily  . sodium chloride flush  3 mL Intravenous Q12H    Continuous Infusions: . sodium chloride 75 mL/hr at 09/20/20 1432  . sodium chloride       LOS: 1 day     Annita Brod, MD [facility]   09/20/2020, 5:50 PM

## 2020-09-20 NOTE — Plan of Care (Signed)
Patient remains free from falls. Pt ate 50% of lunch.Daughter remains at bedside. Safety precautions maintained.

## 2020-09-20 NOTE — ED Notes (Signed)
Date and time results received: 09/20/20 0650 (use smartphrase ".now" to insert current time)  Test: Na Critical Value: 118  Name of Provider Notified: Shaloub   Orders Received? Or Actions Taken?: continue fluids at 47ml/hr

## 2020-09-21 LAB — CBC
HCT: 31.1 % — ABNORMAL LOW (ref 36.0–46.0)
Hemoglobin: 10.6 g/dL — ABNORMAL LOW (ref 12.0–15.0)
MCH: 30.5 pg (ref 26.0–34.0)
MCHC: 34.1 g/dL (ref 30.0–36.0)
MCV: 89.6 fL (ref 80.0–100.0)
Platelets: 264 10*3/uL (ref 150–400)
RBC: 3.47 MIL/uL — ABNORMAL LOW (ref 3.87–5.11)
RDW: 12.6 % (ref 11.5–15.5)
WBC: 7.4 10*3/uL (ref 4.0–10.5)
nRBC: 0 % (ref 0.0–0.2)

## 2020-09-21 LAB — BASIC METABOLIC PANEL
Anion gap: 7 (ref 5–15)
BUN: 9 mg/dL (ref 8–23)
CO2: 21 mmol/L — ABNORMAL LOW (ref 22–32)
Calcium: 7.7 mg/dL — ABNORMAL LOW (ref 8.9–10.3)
Chloride: 100 mmol/L (ref 98–111)
Creatinine, Ser: 0.64 mg/dL (ref 0.44–1.00)
GFR, Estimated: >60 mL/min (ref >60)
Glucose, Bld: 63 mg/dL — ABNORMAL LOW (ref 70–99)
Potassium: 3.8 mmol/L (ref 3.5–5.1)
Sodium: 128 mmol/L — ABNORMAL LOW (ref 135–145)

## 2020-09-21 LAB — MAGNESIUM
Magnesium: 2.2 mg/dL (ref 1.7–2.4)
Magnesium: 2.2 mg/dL (ref 1.7–2.4)

## 2020-09-21 LAB — PHOSPHORUS: Phosphorus: 1.7 mg/dL — ABNORMAL LOW (ref 2.5–4.6)

## 2020-09-21 LAB — BRAIN NATRIURETIC PEPTIDE: B Natriuretic Peptide: 100.8 pg/mL — ABNORMAL HIGH (ref 0.0–100.0)

## 2020-09-21 MED ORDER — ENSURE ENLIVE PO LIQD
237.0000 mL | Freq: Three times a day (TID) | ORAL | Status: DC
  Administered 2020-09-21 (×2): 237 mL via ORAL
  Filled 2020-09-21 (×3): qty 237

## 2020-09-21 MED ORDER — ADULT MULTIVITAMIN W/MINERALS CH
1.0000 | ORAL_TABLET | Freq: Every day | ORAL | Status: DC
  Administered 2020-09-21 – 2020-09-22 (×2): 1 via ORAL
  Filled 2020-09-21 (×2): qty 1

## 2020-09-21 MED ORDER — HYDRALAZINE HCL 50 MG PO TABS
50.0000 mg | ORAL_TABLET | Freq: Three times a day (TID) | ORAL | Status: DC
  Administered 2020-09-21 – 2020-09-22 (×3): 50 mg via ORAL
  Filled 2020-09-21 (×4): qty 1

## 2020-09-21 NOTE — Progress Notes (Signed)
OT Cancellation Note  Patient Details Name: Kimberly Maynard MRN: [id number] DOB: [birth year]   Cancelled Treatment:    Reason Eval/Treat Not Completed: Other (comment)- pt eating lunch and requests OT to return later as able.   Jolaine Artist, OT Acute Rehabilitation Services Pager [phone] Office [phone]   Delight Stare 09/21/2020, 1:46 PM

## 2020-09-21 NOTE — Progress Notes (Signed)
PROGRESS NOTE                                                                                                                                                                                                             Patient Demographics:    Kimberly Maynard, is a 86 y.o. female, DOB - 1928-05-20, LFY:[id number]  Outpatient Primary MD for the patient is Jilda Panda, MD    LOS - 2  Admit date - 09/19/2020    Chief Complaint  Patient presents with   Weakness       Brief Narrative (HPI from H&P)  - Patient is a 86 year old female past history of hypothyroidism, hyperlipidemia Degeneration who was brought in by her daughter on the evening of 10/25 with complaints of weakness, decreased appetite and some confusion, he had symptoms for 3 to 4 days prior to hospital visit, in the ER she was diagnosed with severe dehydration with hyponatremia and hypokalemia likely caused by HCTZ and was admitted to the hospital for   Subjective:    Kimberly Maynard today has, No headache, No chest pain, No abdominal pain - No Nausea, No new weakness tingling or numbness, no Cough - SOB.     Assessment  & Plan :   1.  Acute metabolic encephalopathy caused by hyponatremia, hypokalemia, dehydration due to HCTZ.  Offending medications held, adequately hydrated, sodium is coming up nicely, hold further IV fluids, soft diet and monitor BMP.  No focal deficits mentation back to baseline.  2.  Hypothyroidism.  Stable TSH on home dose Synthroid.  3.  Depression.  Stable on monitor currently on hold.  4.  Essential hypertension.  Discontinue ARB and HCTZ combination, currently on hydralazine.  Stable and monitor.   Condition - Fair  Family Communication  :  Daughter April [phone] 09/21/20  Code Status :  Full  Consults  :  None  Procedures  :  None  PUD Prophylaxis : None  Disposition Plan  :    Status is: Inpatient  Remains  inpatient appropriate because:Inpatient level of care appropriate due to severity of illness   Dispo: The patient is from: Home              Anticipated d/c is to: Home              Anticipated d/c date  is: 2 days              Patient currently is not medically stable to d/c.    DVT Prophylaxis  :  Lovenox   Lab Results  Component Value Date   PLT 325 09/19/2020    Diet :  Diet Order            DIET SOFT Room service appropriate? Yes; Fluid consistency: Thin  Diet effective now                  Inpatient Medications  Scheduled Meds:  enoxaparin (LOVENOX) injection  40 mg Subcutaneous Q24H   levothyroxine  75 mcg Oral Daily   sodium chloride flush  3 mL Intravenous Q12H   Continuous Infusions:  cefTRIAXone (ROCEPHIN)  IV 1 g (09/20/20 2134)   PRN Meds:.acetaminophen **OR** [DISCONTINUED] acetaminophen  Antibiotics  :    Anti-infectives (From admission, onward)   Start     Dose/Rate Route Frequency Ordered Stop   09/20/20 1900  cefTRIAXone (ROCEPHIN) 1 g in sodium chloride 0.9 % 100 mL IVPB        1 g 200 mL/hr over 30 Minutes Intravenous Every 24 hours 09/20/20 1758         Time Spent in minutes  30   Lala Lund M.D on 09/21/2020 at 9:30 AM  To page go to [email or web address] - password [facility]  [facility] -  Office  [phone]    See all Orders from today for further details    Objective:   Vitals:   09/20/20 1459 09/20/20 1951 09/20/20 2310 09/21/20 0549  BP: (!) 130/56  (!) 114/42 (!) 125/53  Pulse: 68  67 63  Resp: 14  19 20   Temp: 98.3 F (36.8 C)  98.6 F (37 C) 98.4 F (36.9 C)  TempSrc:   Oral Oral  SpO2: 100%  99% 100%  Weight:  53.3 kg    Height:  5\' 4"  (1.626 m)      Wt Readings from Last 3 Encounters:  09/20/20 53.3 kg  12/18/18 54.7 kg  12/01/18 54.4 kg     Intake/Output Summary (Last 24 hours) at 09/21/2020 0930 Last data filed at 09/21/2020 0309 Gross per 24 hour  Intake 1773.7 ml  Output 1000 ml  Net  773.7 ml     Physical Exam  Awake Alert, No new F.N deficits, Normal affect [place].AT,PERRAL Supple Neck,No JVD, No cervical lymphadenopathy appriciated.  Symmetrical Chest wall movement, Good air movement bilaterally, CTAB RRR,No Gallops,Rubs or new Murmurs, No Parasternal Heave +ve B.Sounds, Abd Soft, No tenderness, No organomegaly appriciated, No rebound - guarding or rigidity. No Cyanosis, Clubbing or edema, No new Rash or bruise       Data Review:    CBC Recent Labs  Lab 09/19/20 1751  WBC 7.3  HGB 11.3*  HCT 30.2*  PLT 325  MCV 83.9  MCH 31.4  MCHC 37.4*  RDW 11.9    Recent Labs  Lab 09/19/20 1751 09/20/20 0307 09/20/20 0559 09/20/20 1400 09/21/20 0242  NA 115* 116* 118* 123* 128*  K 2.7* 3.2* 2.9* 4.2 3.8  CL 80* 85* 86* 93* 100  CO2 24 22 25  21* 21*  GLUCOSE 108* 86 87 82 63*  BUN 13 11 11 9 9   CREATININE 0.75 0.70 0.71 0.77 0.64  CALCIUM 8.5* 8.0* 8.0* 8.0* 7.7*  MG  --  1.7  --   --  2.2  TSH  --  2.514  --   --   --     ------------------------------------------------------------------------------------------------------------------  No results for input(s): CHOL, HDL, LDLCALC, TRIG, CHOLHDL, LDLDIRECT in the last 72 hours.  Lab Results  Component Value Date   HGBA1C 5.2 12/01/2018   ------------------------------------------------------------------------------------------------------------------ Recent Labs    09/20/20 0307  TSH 2.514    Cardiac Enzymes No results for input(s): CKMB, TROPONINI, MYOGLOBIN in the last 168 hours.  Invalid input(s): CK ------------------------------------------------------------------------------------------------------------------ No results found for: BNP  Micro Results Recent Results (from the past 240 hour(s))  Respiratory Panel by RT PCR (Flu A&B, Covid) - Nasopharyngeal Swab     Status: None   Collection Time: 09/19/20  8:31 PM   Specimen: Nasopharyngeal Swab  Result Value Ref Range Status   SARS  Coronavirus 2 by RT PCR NEGATIVE NEGATIVE Final    Comment: (NOTE) SARS-CoV-2 target nucleic acids are NOT DETECTED.  The SARS-CoV-2 RNA is generally detectable in upper respiratoy specimens during the acute phase of infection. The lowest concentration of SARS-CoV-2 viral copies this assay can detect is 131 copies/mL. A negative result does not preclude SARS-Cov-2 infection and should not be used as the sole basis for treatment or other patient management decisions. A negative result may occur with  improper specimen collection/handling, submission of specimen other than nasopharyngeal swab, presence of viral mutation(s) within the areas targeted by this assay, and inadequate number of viral copies (<131 copies/mL). A negative result must be combined with clinical observations, patient history, and epidemiological information. The expected result is Negative.  Fact Sheet for Patients:  PinkCheek.be  Fact Sheet for Healthcare Providers:  GravelBags.it  This test is no t yet approved or cleared by the [place] FDA and  has been authorized for detection and/or diagnosis of SARS-CoV-2 by FDA under an Emergency Use Authorization (EUA). This EUA will remain  in effect (meaning this test can be used) for the duration of the COVID-19 declaration under Section 564(b)(1) of the Act, 21 U.S.C. section 360bbb-3(b)(1), unless the authorization is terminated or revoked sooner.     Influenza A by PCR NEGATIVE NEGATIVE Final   Influenza B by PCR NEGATIVE NEGATIVE Final    Comment: (NOTE) The Xpert Xpress SARS-CoV-2/FLU/RSV assay is intended as an aid in  the diagnosis of influenza from Nasopharyngeal swab specimens and  should not be used as a sole basis for treatment. Nasal washings and  aspirates are unacceptable for Xpert Xpress SARS-CoV-2/FLU/RSV  testing.  Fact Sheet for  Patients: PinkCheek.be  Fact Sheet for Healthcare Providers: GravelBags.it  This test is not yet approved or cleared by the [place] FDA and  has been authorized for detection and/or diagnosis of SARS-CoV-2 by  FDA under an Emergency Use Authorization (EUA). This EUA will remain  in effect (meaning this test can be used) for the duration of the  Covid-19 declaration under Section 564(b)(1) of the Act, 21  U.S.C. section 360bbb-3(b)(1), unless the authorization is  terminated or revoked. Performed at [facility], [place] [address], [place], [place] [address]     Radiology Reports No results found.

## 2020-09-21 NOTE — Evaluation (Signed)
Physical Therapy Evaluation Patient Details Name: Kimberly Maynard MRN: [id number] DOB: Dec 07, 1927 Today's Date: 09/21/2020   History of Present Illness  Pt is a 86 y.o. female admitted 09/19/20 with c/o weakness, decreased appetite and some confusion. Workup for hyponatremia, acute metabolic encephalopathy. PMH includes HLD, hypothyroidism, depression.    Clinical Impression  Pt presents with an overall decrease in functional mobility secondary to above. PTA, pt reports mod indep with intermittent use of rollator, lives with multiple family members who assist as needed. Today, pt able to transfer and ambulate with RW, requiring up to minA for balance; limited by fatigue, decreased activity tolerance and generalized weakness. Pt would benefit from continued acute PT services to maximize functional mobility and independence prior to d/c with HHPT services.   Post-ambulation BP 144/53 (pt c/o dizziness while walking) SpO2 97% on RA HR 80s    Follow Up Recommendations Home health PT;Supervision/Assistance - 24 hour    Equipment Recommendations  None recommended by PT    Recommendations for Other Services       Precautions / Restrictions Precautions Precautions: Fall;Other (comment) Precaution Comments: Urinary incontinence Restrictions Weight Bearing Restrictions: No      Mobility  Bed Mobility Overal bed mobility: Modified Independent             General bed mobility comments: HOB elevated, use of bed rail    Transfers Overall transfer level: Needs assistance Equipment used: None;Rolling walker (2 wheeled) Transfers: Sit to/from Stand Sit to Stand: Min assist;Min guard         General transfer comment: Initial stand without DME and pt losing balance back onto bed, additional trial with minA to maintain balance upon standing to RW; 2x stand from recliner to RW with min guard, cues for UE placement on arm rests  Ambulation/Gait Ambulation/Gait assistance: Min  guard Gait Distance (Feet): 36 Feet Assistive device: Rolling walker (2 wheeled) Gait Pattern/deviations: Step-through pattern;Decreased stride length;Shuffle;Trunk flexed Gait velocity: Decreased   General Gait Details: Slow, shuffling steps with RW and min guard for balance; cues to increase step length and maintain upright posture; further distance limited by fatigue and pt c/o dizziness; post-walk BP 144/53, SpO2 97% on RA  Stairs            Wheelchair Mobility    Modified Rankin (Stroke Patients Only)       Balance Overall balance assessment: Needs assistance   Sitting balance-Leahy Scale: Fair       Standing balance-Leahy Scale: Poor Standing balance comment: Reliant on UE support or external assist to maintain standing balance                             Pertinent Vitals/Pain Pain Assessment: No/denies pain    Home Living Family/patient expects to be discharged to:: Private residence Living Arrangements: Children Available Help at Discharge: Family;Available 24 hours/day Type of Home: House Home Access: Stairs to enter   [employer] of Steps: 1 Home Layout: One level Home Equipment: Walker - 4 wheels Additional Comments: Reports living with multiple family members and someone is always around to help if needed    Prior Function Level of Independence: Independent with assistive device(s)         Comments: Reports mod indep with occasional use of rollator; stands for showers. Does not drive; enjoys shopping with daughter and watching tv     Hand Dominance        Extremity/Trunk Assessment   Upper  Extremity Assessment Upper Extremity Assessment: Generalized weakness    Lower Extremity Assessment Lower Extremity Assessment: Generalized weakness    Cervical / Trunk Assessment Cervical / Trunk Assessment: Kyphotic  Communication   Communication: HOH  Cognition Arousal/Alertness: Awake/alert Behavior During Therapy:  WFL for tasks assessed/performed Overall Cognitive Status: Difficult to assess                                 General Comments: WFL for simple tasks, A&Ox3 (reoriented to situation); following commands and seemingly anwering questions appropriately. Difficult to truly assess secondary to Hilda Comments      Exercises     Assessment/Plan    PT Assessment Patient needs continued PT services  PT Problem List Decreased strength;Decreased activity tolerance;Decreased balance;Decreased mobility;Decreased knowledge of use of DME       PT Treatment Interventions DME instruction;Gait training;Stair training;Functional mobility training;Therapeutic activities;Therapeutic exercise;Balance training;Patient/family education    PT Goals (Current goals can be found in the Care Plan section)  Acute Rehab PT Goals Patient Stated Goal: Return home; "You'll have to ask my son (about HHPT) when he comes" PT Goal Formulation: With patient Time For Goal Achievement: 10/05/20 Potential to Achieve Goals: Good    Frequency Min 3X/week   Barriers to discharge        Co-evaluation               AM-PAC PT "6 Clicks" Mobility  Outcome Measure Help needed turning from your back to your side while in a flat bed without using bedrails?: A Little Help needed moving from lying on your back to sitting on the side of a flat bed without using bedrails?: A Little Help needed moving to and from a bed to a chair (including a wheelchair)?: A Little Help needed standing up from a chair using your arms (e.g., wheelchair or bedside chair)?: A Little Help needed to walk in hospital room?: A Little Help needed climbing 3-5 steps with a railing? : A Little 6 Click Score: 18    End of Session Equipment Utilized During Treatment: Gait belt Activity Tolerance: Patient tolerated treatment well;Patient limited by fatigue Patient left: in chair;with call bell/phone within reach (chair  alarm under pt but no box in room (NT alerted of this)) Nurse Communication: Mobility status (chair alarm under pt but no box in room (NT alerted of this)) PT Visit Diagnosis: Other abnormalities of gait and mobility (R26.89);Muscle weakness (generalized) (M62.81)    Time: 4401-0272 PT Time Calculation (min) (ACUTE ONLY): 21 min   Charges:   PT Evaluation $PT Eval Moderate Complexity: 1 Mod     Mabeline Caras, PT, DPT Acute Rehabilitation Services  Pager [phone] Office [phone]  Derry Lory 09/21/2020, 12:03 PM

## 2020-09-21 NOTE — Plan of Care (Signed)
  Problem: Urinary Elimination: Goal: Signs and symptoms of infection will decrease Outcome: Progressing   Problem: Nutrition Goal: Patient maintains adequate hydration Outcome: Progressing   Problem: Clinical Measurements: Goal: Will remain free from infection Outcome: Progressing Goal: Diagnostic test results will improve Outcome: Progressing   Problem: Nutrition: Goal: Adequate nutrition will be maintained Outcome: Progressing

## 2020-09-21 NOTE — Progress Notes (Signed)
Initial Nutrition Assessment  DOCUMENTATION CODES:   Non-severe (moderate) malnutrition in context of social or environmental circumstances  INTERVENTION:    Ensure Enlive po TID, each supplement provides 350 kcal and 20 grams of protein  MVI daily   NUTRITION DIAGNOSIS:   Moderate Malnutrition related to social / environmental circumstances (depression) as evidenced by energy intake < 75% for > or equal to 3 months, mild fat depletion, moderate muscle depletion.  GOAL:   Patient will meet greater than or equal to 90% of their needs  MONITOR:   PO intake, Supplement acceptance, Weight trends, Labs, I & O's  REASON FOR ASSESSMENT:   Consult Assessment of nutrition requirement/status  ASSESSMENT:   Patient with PMH significant for macular degeneration, depression, and osteopenia. Presents this admission with hyponatremia in setting of poor PO intake.   Pt heard of hearing, unable to answer all questions without hearing aid. Endorses a loss in appetite over the last few months.  States during this time she consumed oatmeal for breakfast, skipped lunch, and had meat with vegetable for dinner. States she typically didn't finish her meals but intake would fluctuate depending on what her family prepared. She does not use supplementation. No intakes documented at this time. RD observed lunch tray at bedside 100% completed. RD to provide supplementation to maximize kcal and protein this admission.   Pt unsure of UBW and if she has lost weight recently. Records lack weight history over the last year.   Medications: reviewed  Labs: Na 128 (L) Phosphorus 1.7 (L)   NUTRITION - FOCUSED PHYSICAL EXAM:    Most Recent Value  Orbital Region No depletion  Upper Arm Region Mild depletion  Thoracic and Lumbar Region Unable to assess  Buccal Region Mild depletion  Temple Region Mild depletion  Clavicle Bone Region Moderate depletion  Clavicle and Acromion Bone Region Moderate depletion   Scapular Bone Region Unable to assess  Dorsal Hand Severe depletion  Patellar Region Moderate depletion  Anterior Thigh Region Moderate depletion  Posterior Calf Region Moderate depletion  Edema (RD Assessment) Mild  Hair Reviewed  Eyes Reviewed  Mouth Reviewed  Skin Reviewed  Nails Reviewed     Diet Order:   Diet Order            DIET SOFT Room service appropriate? No; Fluid consistency: Thin  Diet effective now                 EDUCATION NEEDS:   Education needs have been addressed  Skin:  Skin Assessment: Reviewed RN Assessment  Last BM:  PTA  Height:   Ht Readings from Last 1 Encounters:  09/20/20 5\' 4"  (1.626 m)    Weight:   Wt Readings from Last 1 Encounters:  09/20/20 53.3 kg    BMI:  Body mass index is 20.17 kg/m.  Estimated Nutritional Needs:   Kcal:  1600-1800 kcal  Protein:  80-95 g  Fluid:  >/= 1.6 L/day  Mariana Single RD, LDN Clinical Nutrition Pager listed in [place]

## 2020-09-22 LAB — CBC WITH DIFFERENTIAL/PLATELET
Abs Immature Granulocytes: 0.05 10*3/uL (ref 0.00–0.07)
Basophils Absolute: 0 10*3/uL (ref 0.0–0.1)
Basophils Relative: 0 %
Eosinophils Absolute: 0.1 10*3/uL (ref 0.0–0.5)
Eosinophils Relative: 1 %
HCT: 28.7 % — ABNORMAL LOW (ref 36.0–46.0)
Hemoglobin: 10.1 g/dL — ABNORMAL LOW (ref 12.0–15.0)
Immature Granulocytes: 1 %
Lymphocytes Relative: 31 %
Lymphs Abs: 2.8 10*3/uL (ref 0.7–4.0)
MCH: 31.4 pg (ref 26.0–34.0)
MCHC: 35.2 g/dL (ref 30.0–36.0)
MCV: 89.1 fL (ref 80.0–100.0)
Monocytes Absolute: 1 10*3/uL (ref 0.1–1.0)
Monocytes Relative: 11 %
Neutro Abs: 4.9 10*3/uL (ref 1.7–7.7)
Neutrophils Relative %: 56 %
Platelets: 283 10*3/uL (ref 150–400)
RBC: 3.22 MIL/uL — ABNORMAL LOW (ref 3.87–5.11)
RDW: 12.6 % (ref 11.5–15.5)
WBC: 8.9 10*3/uL (ref 4.0–10.5)
nRBC: 0 % (ref 0.0–0.2)

## 2020-09-22 LAB — BRAIN NATRIURETIC PEPTIDE: B Natriuretic Peptide: 55.9 pg/mL (ref 0.0–100.0)

## 2020-09-22 LAB — COMPREHENSIVE METABOLIC PANEL
ALT: 15 U/L (ref 0–44)
AST: 16 U/L (ref 15–41)
Albumin: 2.7 g/dL — ABNORMAL LOW (ref 3.5–5.0)
Alkaline Phosphatase: 86 U/L (ref 38–126)
Anion gap: 7 (ref 5–15)
BUN: 18 mg/dL (ref 8–23)
CO2: 25 mmol/L (ref 22–32)
Calcium: 8.4 mg/dL — ABNORMAL LOW (ref 8.9–10.3)
Chloride: 96 mmol/L — ABNORMAL LOW (ref 98–111)
Creatinine, Ser: 0.81 mg/dL (ref 0.44–1.00)
GFR, Estimated: >60 mL/min (ref >60)
Glucose, Bld: 103 mg/dL — ABNORMAL HIGH (ref 70–99)
Potassium: 3.9 mmol/L (ref 3.5–5.1)
Sodium: 128 mmol/L — ABNORMAL LOW (ref 135–145)
Total Bilirubin: 0.5 mg/dL (ref 0.3–1.2)
Total Protein: 4.9 g/dL — ABNORMAL LOW (ref 6.5–8.1)

## 2020-09-22 LAB — PHOSPHORUS: Phosphorus: 1.7 mg/dL — ABNORMAL LOW (ref 2.5–4.6)

## 2020-09-22 MED ORDER — POTASSIUM PHOSPHATES 15 MMOLE/5ML IV SOLN
30.0000 mmol | Freq: Once | INTRAVENOUS | Status: AC
  Administered 2020-09-22: 30 mmol via INTRAVENOUS
  Filled 2020-09-22: qty 10

## 2020-09-22 MED ORDER — AMLODIPINE BESYLATE 10 MG PO TABS: 10.0000 mg | ORAL_TABLET | Freq: Every day | ORAL | 11 refills | Status: DC

## 2020-09-22 NOTE — Discharge Instructions (Signed)
Follow with Primary MD Jilda Panda, MD in 7 days   Get CBC, CMP, 2 view Chest X ray -  checked next visit within 1 week by Primary MD    Activity: As tolerated with Full fall precautions use walker/cane & assistance as needed  Disposition Home    Diet: Heart Healthy Soft  with feeding assistance and aspiration precautions.   Special Instructions: If you have smoked or chewed Tobacco  in the last 2 yrs please stop smoking, stop any regular Alcohol  and or any Recreational drug use.  On your next visit with your primary care physician please Get Medicines reviewed and adjusted.  Please request your Prim.MD to go over all Hospital Tests and Procedure/Radiological results at the follow up, please get all Hospital records sent to your Prim MD by signing hospital release before you go home.  If you experience worsening of your admission symptoms, develop shortness of breath, life threatening emergency, suicidal or homicidal thoughts you must seek medical attention immediately by calling 911 or calling your MD immediately  if symptoms less severe.  You Must read complete instructions/literature along with all the possible adverse reactions/side effects for all the Medicines you take and that have been prescribed to you. Take any new Medicines after you have completely understood and accpet all the possible adverse reactions/side effects.

## 2020-09-22 NOTE — Discharge Summary (Signed)
Kimberly Maynard YOV:[id number] DOB: [birth year] DOA: 09/19/2020  PCP: Jilda Panda, MD  Admit date: 09/19/2020  Discharge date: 09/22/2020  Admitted From: Home  Disposition:  Home   Recommendations for Outpatient Follow-up:   Follow up with PCP in 1-2 weeks  PCP Please obtain BMP/CBC, 2 view CXR in 1week,  (see Discharge instructions)   PCP Please follow up on the following pending results: Monitor CBC, CMP in 7 to 10 days.   Home Health: PT, RN Equipment/Devices: 3 and 1 Consultations: None  Discharge Condition: Stable    CODE STATUS: Full    Diet Recommendation: Heart Healthy   Diet Order            Diet - low sodium heart healthy           DIET SOFT Room service appropriate? No; Fluid consistency: Thin  Diet effective now                  Chief Complaint  Patient presents with  . Weakness     Brief history of present illness from the day of admission and additional interim summary      Patient is a 86 year old female past history of hypothyroidism, hyperlipidemia Degeneration who was brought in by her daughter on the evening of 10/25 with complaints of weakness, decreased appetite and some confusion, he had symptoms for 3 to 4 days prior to hospital visit, in the ER she was diagnosed with severe dehydration with hyponatremia and hypokalemia likely caused by HCTZ and was admitted to the hospital for further treatment.                                                                 Hospital Course    1.  Acute metabolic encephalopathy caused by hyponatremia, hypokalemia, dehydration due to HCTZ.  Offending medications discontinued, adequately hydrated, sodium has come up satisfactorily and she is symptom-free, will be discharged home on new blood pressure medication without diuretics to follow with  PCP in a week, request PCP to check CBC CMP next visit.  2.  Hypothyroidism.  Stable TSH on home dose Synthroid.  3.  Depression.  Stable on monitor currently on hold.  4.  Essential hypertension.  Discontinue ARB and HCTZ combination, based on Norvasc, PCP to monitor and adjust in the outpatient setting as needed.  5. Weakness and deconditioning. Home PT, RN and 3 and 1 DME.   Discharge diagnosis     Principal Problem:   Hyponatremia Active Problems:   Hypothyroidism   Depression   Hypokalemia   Acute metabolic encephalopathy   Essential hypertension   Acute lower UTI    Discharge instructions    Discharge Instructions    Diet - low sodium heart healthy   Complete by: As directed    Discharge  instructions   Complete by: As directed    Follow with Primary MD Jilda Panda, MD in 7 days   Get CBC, CMP, 2 view Chest X ray -  checked next visit within 1 week by Primary MD    Activity: As tolerated with Full fall precautions use walker/cane & assistance as needed  Disposition Home    Diet: Heart Healthy Soft  with feeding assistance and aspiration precautions.   Special Instructions: If you have smoked or chewed Tobacco  in the last 2 yrs please stop smoking, stop any regular Alcohol  and or any Recreational drug use.  On your next visit with your primary care physician please Get Medicines reviewed and adjusted.  Please request your Prim.MD to go over all Hospital Tests and Procedure/Radiological results at the follow up, please get all Hospital records sent to your Prim MD by signing hospital release before you go home.  If you experience worsening of your admission symptoms, develop shortness of breath, life threatening emergency, suicidal or homicidal thoughts you must seek medical attention immediately by calling 911 or calling your MD immediately  if symptoms less severe.  You Must read complete instructions/literature along with all the possible adverse  reactions/side effects for all the Medicines you take and that have been prescribed to you. Take any new Medicines after you have completely understood and accpet all the possible adverse reactions/side effects.   Increase activity slowly   Complete by: As directed       Discharge Medications   Allergies as of 09/22/2020      Reactions   Carisoprodol Other (See Comments)   Unknown reaction   Cyclobenzaprine Hcl Other (See Comments)   Unknown reaction   Penicillins Itching      Medication List    STOP taking these medications   telmisartan-hydrochlorothiazide 80-12.5 MG tablet Commonly known as: MICARDIS HCT     TAKE these medications   acetaminophen 650 MG CR tablet Commonly known as: TYLENOL Take 1 tablet (650 mg total) by mouth as needed for pain.   amLODipine 10 MG tablet Commonly known as: NORVASC Take 1 tablet (10 mg total) by mouth daily.   Ayr 0.65 % nasal spray Generic drug: sodium chloride Place 1 spray into the nose as needed for congestion.   DULoxetine 20 MG capsule Commonly known as: CYMBALTA Take 1 capsule (20 mg total) by mouth daily.   Euthyrox 75 MCG tablet Generic drug: levothyroxine Take 1 tablet by mouth once daily What changed: how much to take   meloxicam 7.5 MG tablet Commonly known as: MOBIC Take 7.5 mg by mouth daily.   simethicone 125 MG chewable tablet Commonly known as: MYLICON Chew 097 mg by mouth every 6 (six) hours as needed for flatulence.   traMADol 50 MG tablet Commonly known as: ULTRAM Take 50 mg by mouth every 12 (twelve) hours as needed for moderate pain.            Durable Medical Equipment  (From admission, onward)         Start     Ordered   09/22/20 0927  For home use only DME 3 n 1  Once        09/22/20 3532           Follow-up Information    Jilda Panda, MD. Schedule an appointment as soon as possible for a visit in 1 week(s).   Specialty: Internal Medicine Contact information: [address] [place] [address] [phone]  Major procedures and Radiology Reports - PLEASE review detailed and final reports thoroughly  -        No results found.  Micro Results    Recent Results (from the past 240 hour(s))  Respiratory Panel by RT PCR (Flu A&B, Covid) - Nasopharyngeal Swab     Status: None   Collection Time: 09/19/20  8:31 PM   Specimen: Nasopharyngeal Swab  Result Value Ref Range Status   SARS Coronavirus 2 by RT PCR NEGATIVE NEGATIVE Final    Comment: (NOTE) SARS-CoV-2 target nucleic acids are NOT DETECTED.  The SARS-CoV-2 RNA is generally detectable in upper respiratoy specimens during the acute phase of infection. The lowest concentration of SARS-CoV-2 viral copies this assay can detect is 131 copies/mL. A negative result does not preclude SARS-Cov-2 infection and should not be used as the sole basis for treatment or other patient management decisions. A negative result may occur with  improper specimen collection/handling, submission of specimen other than nasopharyngeal swab, presence of viral mutation(s) within the areas targeted by this assay, and inadequate number of viral copies (<131 copies/mL). A negative result must be combined with clinical observations, patient history, and epidemiological information. The expected result is Negative.  Fact Sheet for Patients:  PinkCheek.be  Fact Sheet for Healthcare Providers:  GravelBags.it  This test is no t yet approved or cleared by the [place] FDA and  has been authorized for detection and/or diagnosis of SARS-CoV-2 by FDA under an Emergency Use Authorization (EUA). This EUA will remain  in effect (meaning this test can be used) for the duration of the COVID-19 declaration under Section 564(b)(1) of the Act, 21 U.S.C. section 360bbb-3(b)(1), unless the authorization is terminated or revoked sooner.     Influenza A by  PCR NEGATIVE NEGATIVE Final   Influenza B by PCR NEGATIVE NEGATIVE Final    Comment: (NOTE) The Xpert Xpress SARS-CoV-2/FLU/RSV assay is intended as an aid in  the diagnosis of influenza from Nasopharyngeal swab specimens and  should not be used as a sole basis for treatment. Nasal washings and  aspirates are unacceptable for Xpert Xpress SARS-CoV-2/FLU/RSV  testing.  Fact Sheet for Patients: PinkCheek.be  Fact Sheet for Healthcare Providers: GravelBags.it  This test is not yet approved or cleared by the [place] FDA and  has been authorized for detection and/or diagnosis of SARS-CoV-2 by  FDA under an Emergency Use Authorization (EUA). This EUA will remain  in effect (meaning this test can be used) for the duration of the  Covid-19 declaration under Section 564(b)(1) of the Act, 21  U.S.C. section 360bbb-3(b)(1), unless the authorization is  terminated or revoked. Performed at [facility], [place] [address], [place], [place] [address]     Today   Subjective    Kimberly Maynard today has no headache,no chest abdominal pain,no new weakness tingling or numbness, feels much better wants to go home today.     Objective   Blood pressure (!) 139/51, pulse 67, temperature 98.2 F (36.8 C), temperature source Oral, resp. rate 19, height 5\' 4"  (1.626 m), weight 53.2 kg, SpO2 99 %.   Intake/Output Summary (Last 24 hours) at 09/22/2020 0929 Last data filed at 09/22/2020 0850 Gross per 24 hour  Intake --  Output 1250 ml  Net -1250 ml    Exam  Awake Alert, No new F.N deficits, Normal affect [place].AT,PERRAL Supple Neck,No JVD, No cervical lymphadenopathy appriciated.  Symmetrical Chest wall movement, Good air movement bilaterally, CTAB RRR,No Gallops,Rubs or new  Murmurs, No Parasternal Heave +ve B.Sounds, Abd Soft, Non tender, No organomegaly appriciated, No rebound -guarding or rigidity. No Cyanosis, Clubbing or  edema, No new Rash or bruise   Data Review   CBC w Diff:  Lab Results  Component Value Date   WBC 8.9 09/22/2020   HGB 10.1 (L) 09/22/2020   HCT 28.7 (L) 09/22/2020   PLT 283 09/22/2020   LYMPHOPCT 31 09/22/2020   MONOPCT 11 09/22/2020   EOSPCT 1 09/22/2020   BASOPCT 0 09/22/2020    CMP:  Lab Results  Component Value Date   NA 128 (L) 09/22/2020   K 3.9 09/22/2020   CL 96 (L) 09/22/2020   CO2 25 09/22/2020   BUN 18 09/22/2020   CREATININE 0.81 09/22/2020   PROT 4.9 (L) 09/22/2020   ALBUMIN 2.7 (L) 09/22/2020   BILITOT 0.5 09/22/2020   ALKPHOS 86 09/22/2020   AST 16 09/22/2020   ALT 15 09/22/2020  .   Total Time in preparing paper work, data evaluation and todays exam - 20 minutes  Lala Lund M.D on 09/22/2020 at 9:29 AM  [facility]   Office  [phone]

## 2020-09-22 NOTE — TOC Initial Note (Signed)
Transition of Care [facility]) - Initial/Assessment Note    Patient Details  Name: Kimberly Maynard MRN: [id number] Date of Birth: 11-18-1928  Transition of [facility]) CM/SW Contact:    Joanne Chars, LCSW Phone Number: 09/22/2020, 10:12 AM  Clinical Narrative:  CSW met with pt to discuss discharge recommendation for [facility].  Pt agreeable, permission given to speak to [facility] and to daughter April, who pt lives with.  Choice document given.  Pt is vaccinated.  Spoke with April who is also in agreement with [facility] and with recommendation for 3n1.  No preference on provider.  Pt other daughter Myna Hidalgo will provide transport home and can be reached on [phone].   [facility] to bring 3n1, Alvis Lemmings accepts for [facility]. No other needs identified.                 Expected Discharge Plan: [place] Barriers to Discharge: No Barriers Identified   Patient Goals and CMS Choice Patient states their goals for this hospitalization and ongoing recovery are:: get home CMS Medicare.gov Compare Post Acute Care list provided to:: Patient Choice offered to / list presented to : Patient  Expected Discharge Plan and Services Expected Discharge Plan: [place] Choice: [place] arrangements for the past 2 months: Single Family Home Expected Discharge Date: 09/22/20               DME Arranged: 3-N-1 DME Agency: [employer] Date DME Agency Contacted: 09/22/20 Time DME Agency Contacted: [phone] Representative spoke with at DME Agency: Freda Munro HH Arranged: RN, PT [place] Agency: [place] Date [place]: 09/22/20 Time [place]: 61 Representative spoke with at [place]: [place] Arrangements/Services Living arrangements for the past 2 months: [place] Lives with:: Adult Children, Relatives Patient language and need for interpreter reviewed:: Yes Do you feel safe going back to the place where you live?: Yes       Need for Family Participation in Patient Care: Yes (Comment) Care giver support system in place?: Yes (comment) Current home services: Other (comment) (none) Criminal Activity/Legal Involvement Pertinent to Current Situation/Hospitalization: No - Comment as needed  Activities of Daily Living Home Assistive Devices/Equipment: Other (Comment) ADL Screening (condition at time of admission) Patient's cognitive ability adequate to safely complete daily activities?: Yes Is the patient deaf or have difficulty hearing?: Yes Does the patient have difficulty seeing, even when wearing glasses/contacts?: Yes Does the patient have difficulty concentrating, remembering, or making decisions?: No Patient able to express need for assistance with ADLs?: Yes Does the patient have difficulty dressing or bathing?: No Independently performs ADLs?: No Does the patient have difficulty walking or climbing stairs?: Yes Weakness of Legs: Both Weakness of Arms/Hands: None  Permission Sought/Granted Permission sought to share information with : Family Supports, Chartered certified accountant granted to share information with : Yes, Verbal Permission Granted  Share Information with NAME: daughter April  Permission granted to share info w AGENCY: HH        Emotional Assessment Appearance:: Appears stated age Attitude/Demeanor/Rapport: Engaged Affect (typically observed): Pleasant Orientation: : Oriented to Self, Oriented to Place, Oriented to  Time, Oriented to Situation Alcohol / Substance Use: Not Applicable Psych Involvement: No (comment)  Admission diagnosis:  Hypokalemia [E87.6] Hyponatremia [E87.1] Hypotension, unspecified hypotension type [I95.9] Patient Active Problem List   Diagnosis Date Noted   Hypokalemia 69/62/9528   Acute metabolic encephalopathy 41/32/4401  Essential hypertension 09/20/2020   Acute lower UTI 09/20/2020   Hyponatremia 09/19/2020   Intermediate  stage nonexudative age-related macular degeneration of left eye 08/15/2020   Exudative age-related macular degeneration of left eye with active choroidal neovascularization ([place]) 03/21/2020   Retinal hemorrhage of left eye 03/21/2020   Osteoarthritis 08/16/2014   Hyperlipemia 01/15/2008   Depression 05/05/2007   Hypothyroidism 08/30/2006   OSTEOPENIA 08/30/2006   PCP:  Jilda Panda, MD Pharmacy:   [place], [place] - [address] [place] [address] Phone: [phone] Fax: [phone]     Social Determinants of Health (SDOH) Interventions    Readmission Risk Interventions No flowsheet data found.

## 2020-09-22 NOTE — Evaluation (Signed)
Occupational Therapy Evaluation Patient Details Name: Kimberly Maynard MRN: [id number] DOB: January 28, 1928 Today's Date: 09/22/2020    History of Present Illness Pt is a 86 y.o. female admitted 09/19/20 with c/o weakness, decreased appetite and some confusion. Workup for hyponatremia, acute metabolic encephalopathy. PMH includes HLD, hypothyroidism, depression.   Clinical Impression   PTA, pt reports living at home with family available 24/7, she reports she was modified independent with functional mobility with occasional use of rollator. She reports independence with ADL and stands in the shower. Pt completed full body sponge bath while standing at sink level, requiring at least single UE support for stability, 1x minor LOB with bathing BLE while standing and reported minor lightheadedness (subsided after standing upright 10seconds)after bending down. Pt appears to be close to baseline. Educated pt on use of shower chair to maximize safety and reduce risk for falling, pt verbalized understanding. Recommend 3in1 for use in shower and for use as commode as pt reports quick onset urgency for urination and having to ambulate to bathroom quickly. Educated pt on strategies to reduce risk for falling, pt verbalized understanding. Per NT, pt with anticipated d/c this date. Feel pt is appropriate to d/c home with family when medically stable. No follow-up OT recommended.     Follow Up Recommendations  No OT follow up;Supervision - Intermittent (with mobility)    Equipment Recommendations  3 in 1 bedside commode    Recommendations for Other Services       Precautions / Restrictions Precautions Precautions: Fall;Other (comment) Precaution Comments: Urinary incontinence Restrictions Weight Bearing Restrictions: No      Mobility Bed Mobility Overal bed mobility: Modified Independent             General bed mobility comments: HOB elevated slighlty elevated, use of bed rail     Transfers Overall transfer level: Needs assistance Equipment used: Rolling walker (2 wheeled) Transfers: Sit to/from Stand Sit to Stand: Min guard         General transfer comment: minguard for safety, pt reliant on at least single UE Support for stabiliyt in standing;demonstrates good UE placement during transfers    Balance Overall balance assessment: Needs assistance Sitting-balance support: No upper extremity supported;Feet unsupported Sitting balance-Leahy Scale: Good       Standing balance-Leahy Scale: Poor Standing balance comment: reliant on at least single UE support in standing, 1x minor LOB pt able to self correct while completing LB bathing at sink level in standing                           ADL either performed or assessed with clinical judgement   ADL Overall ADL's : Needs assistance/impaired Eating/Feeding: Independent   Grooming: Supervision/safety;Standing   Upper Body Bathing: Supervision/ safety;Standing   Lower Body Bathing: Sit to/from stand;Min guard   Upper Body Dressing : Modified independent;Sitting   Lower Body Dressing: Min guard;Sit to/from stand   Toilet Transfer: Min guard;Ambulation Toilet Transfer Details (indicate cue type and reason): in room mobility Toileting- Clothing Manipulation and Hygiene: Min guard;Sit to/from stand       Functional mobility during ADLs: Min guard;Rolling walker General ADL Comments: pt completed full body washup while standing at sink level with minguard for safety, 1x minor LOB with washing BLE in standing;educated pt on importance of using shower seat to maximize safety and reduce risk for fallings     Vision         Perception  Praxis      Pertinent Vitals/Pain Pain Assessment: No/denies pain     Hand Dominance Right   Extremity/Trunk Assessment Upper Extremity Assessment Upper Extremity Assessment: Generalized weakness   Lower Extremity Assessment Lower Extremity  Assessment: Generalized weakness   Cervical / Trunk Assessment Cervical / Trunk Assessment: Kyphotic   Communication Communication Communication: HOH   Cognition Arousal/Alertness: Awake/alert Behavior During Therapy: WFL for tasks assessed/performed Overall Cognitive Status: Difficult to assess                                 General Comments: WFL for simple tasks, A&Ox3 (reoriented to situation); following commands and seemingly anwering questions appropriately. Difficult to truly assess secondary to [place] Comments  vss HR 103bpm with bathing at sink level    Exercises     Shoulder Instructions      Home Living Family/patient expects to be discharged to:: Private residence Living Arrangements: Children Available Help at Discharge: Family;Available 24 hours/day Type of Home: House Home Access: Stairs to enter [employer] of Steps: 1   Home Layout: One level     Bathroom Shower/Tub: Teacher, early years/pre: Standard     Home Equipment: Environmental consultant - 4 wheels   Additional Comments: Reports living with multiple family members and someone is always around to help if needed      Prior Functioning/Environment Level of Independence: Independent with assistive device(s)        Comments: Reports mod indep with occasional use of rollator; stands for showers. Does not drive; enjoys shopping with daughter and watching tv        OT Problem List: Impaired balance (sitting and/or standing);Decreased knowledge of use of DME or AE;Decreased activity tolerance      OT Treatment/Interventions:      OT Goals(Current goals can be found in the care plan section) Acute Rehab OT Goals Patient Stated Goal: to return home with family OT Goal Formulation: With patient Time For Goal Achievement: 10/06/20 Potential to Achieve Goals: Good  OT Frequency:     Barriers to D/C:            Co-evaluation              AM-PAC OT "6  Clicks" Daily Activity     Outcome Measure Help from another person eating meals?: None Help from another person taking care of personal grooming?: A Little Help from another person toileting, which includes using toliet, bedpan, or urinal?: A Little Help from another person bathing (including washing, rinsing, drying)?: A Little Help from another person to put on and taking off regular upper body clothing?: None Help from another person to put on and taking off regular lower body clothing?: A Little 6 Click Score: 20   End of Session Equipment Utilized During Treatment: Gait belt;Rolling walker Nurse Communication: Mobility status  Activity Tolerance: Patient tolerated treatment well Patient left: in chair;with call bell/phone within reach  OT Visit Diagnosis: Other abnormalities of gait and mobility (R26.89);Muscle weakness (generalized) (M62.81)                Time: 4098-1191 OT Time Calculation (min): 20 min Charges:  OT General Charges $OT Visit: 1 Visit OT Evaluation $OT Eval Moderate Complexity: [place] OTR/L Acute Rehabilitation Services Office: [place] 09/22/2020, 9:08 AM

## 2020-09-24 DIAGNOSIS — G9341 Metabolic encephalopathy: Secondary | ICD-10-CM | POA: Diagnosis not present

## 2020-09-24 DIAGNOSIS — T502X5D Adverse effect of carbonic-anhydrase inhibitors, benzothiadiazides and other diuretics, subsequent encounter: Secondary | ICD-10-CM | POA: Diagnosis not present

## 2020-09-24 DIAGNOSIS — E871 Hypo-osmolality and hyponatremia: Secondary | ICD-10-CM | POA: Diagnosis not present

## 2020-09-24 DIAGNOSIS — E860 Dehydration: Secondary | ICD-10-CM | POA: Diagnosis not present

## 2020-09-24 DIAGNOSIS — E876 Hypokalemia: Secondary | ICD-10-CM | POA: Diagnosis not present

## 2020-10-10 ENCOUNTER — Encounter (INDEPENDENT_AMBULATORY_CARE_PROVIDER_SITE_OTHER): Payer: Medicare Other | Admitting: Ophthalmology

## 2020-10-17 ENCOUNTER — Encounter (INDEPENDENT_AMBULATORY_CARE_PROVIDER_SITE_OTHER): Payer: Self-pay | Admitting: Ophthalmology

## 2020-10-17 ENCOUNTER — Ambulatory Visit (INDEPENDENT_AMBULATORY_CARE_PROVIDER_SITE_OTHER): Payer: Medicare Other | Admitting: Ophthalmology

## 2020-10-17 ENCOUNTER — Other Ambulatory Visit: Payer: Self-pay

## 2020-10-17 DIAGNOSIS — H353112 Nonexudative age-related macular degeneration, right eye, intermediate dry stage: Secondary | ICD-10-CM

## 2020-10-17 DIAGNOSIS — H353122 Nonexudative age-related macular degeneration, left eye, intermediate dry stage: Secondary | ICD-10-CM | POA: Diagnosis not present

## 2020-10-17 DIAGNOSIS — H353221 Exudative age-related macular degeneration, left eye, with active choroidal neovascularization: Secondary | ICD-10-CM

## 2020-10-17 MED ORDER — BEVACIZUMAB CHEMO INJECTION 1.25MG/0.05ML SYRINGE FOR KALEIDOSCOPE
1.2500 mg | INTRAVITREAL | Status: AC | PRN
  Administered 2020-10-17: 1.25 mg via INTRAVITREAL

## 2020-10-17 NOTE — Patient Instructions (Signed)
Patient instructed to contact us promptly for new onset visual acuity decline distortions or decline

## 2020-10-17 NOTE — Assessment & Plan Note (Signed)

## 2020-10-17 NOTE — Assessment & Plan Note (Signed)
Now subfoveal fibrosis limits acuity yet no active edges to stabilize the lesion and prevent Growth of scotoma.  Less intraretinal fluid today at 8-week follow-up.  We will repeat injection today and examination in 3 months

## 2020-10-17 NOTE — Progress Notes (Signed)
10/17/2020     CHIEF COMPLAINT Patient presents for Retina Follow Up   HISTORY OF PRESENT ILLNESS: Kimberly Maynard is a 86 y.o. female who presents to the clinic today for:   HPI    Retina Follow Up    Patient presents with  Wet AMD.  In left eye.  This started 9 weeks ago.  Severity is mild.  Duration of 9 weeks.  Since onset it is stable.          Comments    9 WK F/U OS, POSS AVASTIN OS       Last edited by Nichola Sizer D on 10/17/2020  2:59 PM. (History)      Referring physician: Caren Macadam, MD [place],  [place] [address]  HISTORICAL INFORMATION:   Selected notes from the MEDICAL RECORD NUMBER    Lab Results  Component Value Date   HGBA1C 5.2 12/01/2018     CURRENT MEDICATIONS: No current outpatient medications on file. (Ophthalmic Drugs)   No current facility-administered medications for this visit. (Ophthalmic Drugs)   Current Outpatient Medications (Other)  Medication Sig  . acetaminophen (TYLENOL) 650 MG CR tablet Take 1 tablet (650 mg total) by mouth as needed for pain.  Marland Kitchen amLODipine (NORVASC) 10 MG tablet Take 1 tablet (10 mg total) by mouth daily.  . DULoxetine (CYMBALTA) 20 MG capsule Take 1 capsule (20 mg total) by mouth daily.  Arna Medici 75 MCG tablet Take 1 tablet by mouth once daily (Patient taking differently: Take 75 mcg by mouth daily. )  . meloxicam (MOBIC) 7.5 MG tablet Take 7.5 mg by mouth daily.  . simethicone (MYLICON) 979 MG chewable tablet Chew 125 mg by mouth every 6 (six) hours as needed for flatulence.  . sodium chloride (AYR) 0.65 % nasal spray Place 1 spray into the nose as needed for congestion.  . traMADol (ULTRAM) 50 MG tablet Take 50 mg by mouth every 12 (twelve) hours as needed for moderate pain.    No current facility-administered medications for this visit. (Other)      REVIEW OF SYSTEMS:    ALLERGIES Allergies  Allergen Reactions  . Carisoprodol Other (See Comments)     Unknown reaction  . Cyclobenzaprine Hcl Other (See Comments)    Unknown reaction  . Penicillins Itching    PAST MEDICAL HISTORY Past Medical History:  Diagnosis Date  . ANEMIA 01/15/2008   resolved  . ANEURYSM NOS 08/30/2006   offered imaging to assess stability, no guidelines for this, she declined  . DEPRESSION 05/05/2007  . FASTING HYPERGLYCEMIA 05/05/2007  . HYPERLIPIDEMIA 01/15/2008  . HYPOTHYROIDISM 08/30/2006  . OSTEOPENIA 08/30/2006  . 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 Skin cancer   . Splenic artery aneurysm - last CT 2012 stable 08/16/2014   Past Surgical History:  Procedure Laterality Date  . ABDOMINAL HYSTERECTOMY    . OOPHORECTOMY      FAMILY HISTORY Family History  Problem Relation Age of Onset  . Diabetes Father   . Diabetes Sister   . Cancer Brother        pancreatic, prostate  . Diabetes Brother     SOCIAL HISTORY Social History   Tobacco Use  . Smoking status: Never Smoker  . Smokeless tobacco: Never Used  Substance Use Topics  . Alcohol use: No  . Drug use: No         OPHTHALMIC EXAM: Base Eye Exam    Visual  Acuity (ETDRS)      Right Left   Dist cc 20/60 +2 20/100 -3   Dist ph cc 20/50 -1 NI   Correction: Glasses       Tonometry (Tonopen, 3:04 PM)      Right Left   Pressure 12 10       Pupils      Pupils Dark Light Shape React APD   Right PERRL 3 2 Round Slow None   Left PERRL 3 2 Round Slow None       Visual Fields (Counting fingers)      Left Right    Full Full       Extraocular Movement      Right Left    Full Full       Neuro/Psych    Oriented x3: Yes   Mood/Affect: Normal       Dilation    Left eye: 1.0% Mydriacyl, 2.5% Phenylephrine @ 3:08 PM        Slit Lamp and Fundus Exam    External Exam      Right Left   External Normal Normal       Slit Lamp Exam      Right Left   Lids/Lashes Normal Normal   Conjunctiva/Sclera White and quiet White and quiet   Cornea Clear  Clear   Anterior Chamber Deep and quiet Deep and quiet   Iris Round and reactive Round and reactive   Lens Posterior chamber intraocular lens Posterior chamber intraocular lens   Anterior Vitreous Normal Normal       Fundus Exam      Right Left   Posterior Vitreous  Posterior vitreous detachment   Disc  Peripapillary atrophy   C/D Ratio  0.2   Macula  Subretinal neovascular membrane less active, Retinal pigment epithelial mottling,  Less Cystoid macular edema, Macular thickening, Retinal pigment epithelial atrophy, Geographic atrophy,, Disciform scar, no subretinal hemorrhage or fluid   Vessels  Normal   Periphery  Normal          IMAGING AND PROCEDURES  Imaging and Procedures for 10/17/20  OCT, Retina - OU - Both Eyes       Right Eye Quality was good. Scan locations included subfoveal. Central Foveal Thickness: 263. Progression has been stable. Findings include abnormal foveal contour, no SRF, no IRF.   Left Eye Quality was good. Scan locations included subfoveal. Central Foveal Thickness: 331. Progression has improved. Findings include subretinal hyper-reflective material, disciform scar, intraretinal fluid.   Notes Much less active CNVM subfoveal left eye       Intravitreal Injection, Pharmacologic Agent - OS - Left Eye       Time Out 10/17/2020. 3:38 PM. Confirmed correct patient, procedure, site, and patient consented.   Anesthesia Topical anesthesia was used. Anesthetic medications included Akten 3.5%.   Procedure Preparation included 5% betadine to ocular surface, 10% betadine to eyelids. A 30 gauge needle was used.   Injection:  1.25 mg Bevacizumab (AVASTIN) SOLN   NDC: 99242-683-41   Route: Intravitreal, Site: Left Eye, Waste: 0 mg  Post-op Post injection exam found visual acuity of at least counting fingers. The patient tolerated the procedure well. There were no complications. The patient received written and verbal post procedure care education.  Post injection medications were not given.                 ASSESSMENT/PLAN:  Exudative age-related macular degeneration of left eye with active choroidal neovascularization (HCC) Now subfoveal  fibrosis limits acuity yet no active edges to stabilize the lesion and prevent Growth of scotoma.  Less intraretinal fluid today at 8-week follow-up.  We will repeat injection today and examination in 3 months  Intermediate stage nonexudative age-related macular degeneration of right eye The nature of age--related macular degeneration was discussed with the patient as well as the distinction between dry and wet types. Checking an Amsler Grid daily with advice to return immediately should a distortion develop, was given to the patient. The patient 's smoking status now and in the past was determined and advice based on the AREDS study was provided regarding the consumption of antioxidant supplements. AREDS 2 vitamin formulation was recommended. Consumption of dark leafy vegetables and fresh fruits of various colors was recommended. Treatment modalities for wet macular degeneration particularly the use of intravitreal injections of anti-blood vessel growth factors was discussed with the patient. Avastin, Lucentis, and Eylea are the available options. On occasion, therapy includes the use of photodynamic therapy and thermal laser. Stressed to the patient do not rub eyes.  Patient was advised to check Amsler Grid daily and return immediately if changes are noted. Instructions on using the grid were given to the patient. All patient questions were answered.      ICD-10-CM   1. Exudative age-related macular degeneration of left eye with active choroidal neovascularization (HCC)  H35.3221 OCT, Retina - OU - Both Eyes    Intravitreal Injection, Pharmacologic Agent - OS - Left Eye    Bevacizumab (AVASTIN) SOLN 1.25 mg  2. Intermediate stage nonexudative age-related macular degeneration of left eye  H35.3122   3.  Intermediate stage nonexudative age-related macular degeneration of right eye  H35.3112     1.  Vastly improved macular anatomy left eye and stabilization of acuity, residual subfoveal disciform limits acuity however  2.  Repeat injection intravitreal Avastin OS today  3.  Dilate OU next in 3 months and likely treat left eye with Avastin Ophthalmic Meds Ordered this visit:  Meds ordered this encounter  Medications  . Bevacizumab (AVASTIN) SOLN 1.25 mg       Return in about 3 months (around 01/17/2021) for DILATE OU, AVASTIN OCT, OS.  Patient Instructions  Patient instructed to contact us promptly for new onset visual acuity decline distortions or decline    Explained the diagnoses, plan, and follow up with the patient and they expressed understanding.  Patient expressed understanding of the importance of proper follow up care.   Clent Demark Emilea Goga M.D. Diseases & Surgery of the Retina and Vitreous [facility]ina [facility] Diabetic [place] 10/17/20     Abbreviations: M myopia (nearsighted); A astigmatism; H hyperopia (farsighted); P presbyopia; Mrx spectacle prescription;  CTL contact lenses; OD right eye; OS left eye; OU both eyes  XT exotropia; ET esotropia; PEK punctate epithelial keratitis; PEE punctate epithelial erosions; DES dry eye syndrome; MGD meibomian gland dysfunction; ATs artificial tears; PFAT's preservative free artificial tears; [place] nuclear sclerotic cataract; PSC posterior subcapsular cataract; ERM epi-retinal membrane; PVD posterior vitreous detachment; RD retinal detachment; DM diabetes mellitus; DR diabetic retinopathy; NPDR non-proliferative diabetic retinopathy; PDR proliferative diabetic retinopathy; CSME clinically significant macular edema; DME diabetic macular edema; dbh dot blot hemorrhages; CWS cotton wool spot; POAG primary open angle glaucoma; C/D cup-to-disc ratio; HVF humphrey visual field; GVF goldmann visual field; OCT optical coherence tomography; IOP intraocular  pressure; BRVO Branch retinal vein occlusion; CRVO central retinal vein occlusion; CRAO central retinal artery occlusion; BRAO branch retinal artery occlusion; RT retinal tear; SB  scleral buckle; PPV pars plana vitrectomy; VH Vitreous hemorrhage; PRP panretinal laser photocoagulation; IVK intravitreal kenalog; VMT vitreomacular traction; MH Macular hole;  NVD neovascularization of the disc; NVE neovascularization elsewhere; AREDS age related eye disease study; ARMD age related macular degeneration; POAG primary open angle glaucoma; EBMD epithelial/anterior basement membrane dystrophy; ACIOL anterior chamber intraocular lens; IOL intraocular lens; PCIOL posterior chamber intraocular lens; Phaco/IOL phacoemulsification with intraocular lens placement; [place] photorefractive keratectomy; LASIK laser assisted in situ keratomileusis; HTN hypertension; DM diabetes mellitus; COPD chronic obstructive pulmonary disease

## 2021-01-16 ENCOUNTER — Encounter (INDEPENDENT_AMBULATORY_CARE_PROVIDER_SITE_OTHER): Payer: Self-pay | Admitting: Ophthalmology

## 2021-01-16 ENCOUNTER — Ambulatory Visit (INDEPENDENT_AMBULATORY_CARE_PROVIDER_SITE_OTHER): Payer: Medicare Other | Admitting: Ophthalmology

## 2021-01-16 ENCOUNTER — Other Ambulatory Visit: Payer: Self-pay

## 2021-01-16 DIAGNOSIS — H353112 Nonexudative age-related macular degeneration, right eye, intermediate dry stage: Secondary | ICD-10-CM | POA: Diagnosis not present

## 2021-01-16 DIAGNOSIS — H353221 Exudative age-related macular degeneration, left eye, with active choroidal neovascularization: Secondary | ICD-10-CM | POA: Diagnosis not present

## 2021-01-16 MED ORDER — BEVACIZUMAB 2.5 MG/0.1ML IZ SOSY
2.5000 mg | PREFILLED_SYRINGE | INTRAVITREAL | Status: AC | PRN
  Administered 2021-01-16: 2.5 mg via INTRAVITREAL

## 2021-01-16 NOTE — Assessment & Plan Note (Signed)
72-month follow-up today, much less active with subfoveal scarring accounting for acuity.  Repeat injection today to minimize growth of scotoma and maintain improve intraretinal fluid and subretinal fluid overlying the disciform scar.  Examination next in 4 months

## 2021-01-16 NOTE — Assessment & Plan Note (Signed)
No signs of CNVM OD today

## 2021-01-16 NOTE — Progress Notes (Signed)
01/16/2021     CHIEF COMPLAINT Patient presents for Retina Follow Up (3 MO FU OU, POSS AVASTIN OS///Pt reports stable vision OU. Pt denies any new F/F, pain, or pressure OU.)   HISTORY OF PRESENT ILLNESS: Kimberly Maynard is a 87 y.o. female who presents to the clinic today for:   HPI    Retina Follow Up    Patient presents with  Wet AMD.  In left eye.  This started 3 months ago.  Duration of 3 months.  Since onset it is stable. Additional comments: 3 MO FU OU, POSS AVASTIN OS   Pt reports stable vision OU. Pt denies any new F/F, pain, or pressure OU.       Last edited by Nichola Sizer D on 01/16/2021  1:43 PM. (History)      Referring physician: Jilda Panda, MD [address] [place],  [place] [address]  HISTORICAL INFORMATION:   Selected notes from the MEDICAL RECORD NUMBER    Lab Results  Component Value Date   HGBA1C 5.2 12/01/2018     CURRENT MEDICATIONS: No current outpatient medications on file. (Ophthalmic Drugs)   No current facility-administered medications for this visit. (Ophthalmic Drugs)   Current Outpatient Medications (Other)  Medication Sig  . acetaminophen (TYLENOL) 650 MG CR tablet Take 1 tablet (650 mg total) by mouth as needed for pain.  Marland Kitchen amLODipine (NORVASC) 10 MG tablet Take 1 tablet (10 mg total) by mouth daily.  . DULoxetine (CYMBALTA) 20 MG capsule Take 1 capsule (20 mg total) by mouth daily.  Arna Medici 75 MCG tablet Take 1 tablet by mouth once daily (Patient taking differently: Take 75 mcg by mouth daily. )  . meloxicam (MOBIC) 7.5 MG tablet Take 7.5 mg by mouth daily.  . simethicone (MYLICON) 948 MG chewable tablet Chew 125 mg by mouth every 6 (six) hours as needed for flatulence.  . sodium chloride (AYR) 0.65 % nasal spray Place 1 spray into the nose as needed for congestion.  . traMADol (ULTRAM) 50 MG tablet Take 50 mg by mouth every 12 (twelve) hours as needed for moderate pain.    No current facility-administered medications for  this visit. (Other)      REVIEW OF SYSTEMS:    ALLERGIES Allergies  Allergen Reactions  . Carisoprodol Other (See Comments)    Unknown reaction  . Cyclobenzaprine Hcl Other (See Comments)    Unknown reaction  . Penicillins Itching    PAST MEDICAL HISTORY Past Medical History:  Diagnosis Date  . ANEMIA 01/15/2008   resolved  . ANEURYSM NOS 08/30/2006   offered imaging to assess stability, no guidelines for this, she declined  . DEPRESSION 05/05/2007  . FASTING HYPERGLYCEMIA 05/05/2007  . HYPERLIPIDEMIA 01/15/2008  . HYPOTHYROIDISM 08/30/2006  . OSTEOPENIA 08/30/2006  . 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 Skin cancer   . Splenic artery aneurysm - last CT 2012 stable 08/16/2014   Past Surgical History:  Procedure Laterality Date  . ABDOMINAL HYSTERECTOMY    . OOPHORECTOMY      FAMILY HISTORY Family History  Problem Relation Age of Onset  . Diabetes Father   . Diabetes Sister   . Cancer Brother        pancreatic, prostate  . Diabetes Brother     SOCIAL HISTORY Social History   Tobacco Use  . Smoking status: Never Smoker  . Smokeless tobacco: Never Used  Substance Use Topics  . Alcohol use: No  . Drug  use: No         OPHTHALMIC EXAM:  Base Eye Exam    Visual Acuity (ETDRS)      Right Left   Dist cc 20/40 +2 CF at 3'   Dist ph cc NI NI   Correction: Glasses       Tonometry (Tonopen, 1:52 PM)      Right Left   Pressure 19 15       Pupils      Pupils Dark Light Shape React APD   Right PERRL 3 2 Round Slow None   Left PERRL 3 2 Round Slow None       Visual Fields (Counting fingers)      Left Right    Full Full       Extraocular Movement      Right Left    Full Full       Neuro/Psych    Oriented x3: Yes   Mood/Affect: Normal       Dilation    Both eyes: 1.0% Mydriacyl, 2.5% Phenylephrine @ 1:52 PM        Slit Lamp and Fundus Exam    External Exam      Right Left   External Normal Normal        Slit Lamp Exam      Right Left   Lids/Lashes Normal Normal   Conjunctiva/Sclera White and quiet White and quiet   Cornea Clear Clear   Anterior Chamber Deep and quiet Deep and quiet   Iris Round and reactive Round and reactive   Lens Posterior chamber intraocular lens Posterior chamber intraocular lens   Anterior Vitreous Normal Normal       Fundus Exam      Right Left   Posterior Vitreous Posterior vitreous detachment Posterior vitreous detachment   Disc Normal Peripapillary atrophy   C/D Ratio 0.0 0.2   Macula Hard drusen, Pigmented atrophy, no hemorrhage, no membrane, Retinal pigment epithelial mottling, no macular thickening Subretinal neovascular membrane less active now fibrotic  White, Retinal pigment epithelial mottling,  Less Cystoid macular edema, Macular thickening, Retinal pigment epithelial atrophy, Geographic atrophy,, Disciform scar, no subretinal hemorrhage or fluid   Vessels Normal Normal   Periphery Normal Normal          IMAGING AND PROCEDURES  Imaging and Procedures for 01/16/21  OCT, Retina - OU - Both Eyes       Right Eye Quality was good. Scan locations included subfoveal. Central Foveal Thickness: 277. Progression has been stable. Findings include abnormal foveal contour, no SRF, no IRF.   Left Eye Quality was good. Scan locations included subfoveal. Central Foveal Thickness: 331. Progression has improved. Findings include subretinal hyper-reflective material, disciform scar, outer retinal tubulation.   Notes Much less active CNVM subfoveal left eye, much less intraretinal fluid with outer retinal tubulation's       Intravitreal Injection, Pharmacologic Agent - OS - Left Eye       Time Out 01/16/2021. 2:51 PM. Confirmed correct patient, procedure, site, and patient consented.   Anesthesia Topical anesthesia was used. Anesthetic medications included Akten 3.5%.   Procedure Preparation included 5% betadine to ocular surface, 10% betadine to  eyelids. A 30 gauge needle was used.   Injection:  2.5 mg Bevacizumab (AVASTIN) 2.5mg /0.50mL SOSY   NDC: 24580-998-33, Lot: [id number]   Route: Intravitreal, Site: Left Eye  Post-op Post injection exam found visual acuity of at least counting fingers. The patient tolerated the procedure well. There were no  complications. The patient received written and verbal post procedure care education. Post injection medications were not given.                 ASSESSMENT/PLAN:  Exudative age-related macular degeneration of left eye with active choroidal neovascularization (HCC) 70-month follow-up today, much less active with subfoveal scarring accounting for acuity.  Repeat injection today to minimize growth of scotoma and maintain improve intraretinal fluid and subretinal fluid overlying the disciform scar.  Examination next in 4 months  Intermediate stage nonexudative age-related macular degeneration of right eye No signs of CNVM OD today      ICD-10-CM   1. Exudative age-related macular degeneration of left eye with active choroidal neovascularization (HCC)  H35.3221 OCT, Retina - OU - Both Eyes    Intravitreal Injection, Pharmacologic Agent - OS - Left Eye    bevacizumab (AVASTIN) SOSY 2.5 mg  2. Intermediate stage nonexudative age-related macular degeneration of right eye  H35.3112     1.  Repeat Avastin left eye today, examination next dilated OU in 4 months 2.  3.  Ophthalmic Meds Ordered this visit:  Meds ordered this encounter  Medications  . bevacizumab (AVASTIN) SOSY 2.5 mg       Return in about 4 months (around 05/16/2021) for DILATE OU, COLOR FP, OCT.  There are no Patient Instructions on file for this visit.   Explained the diagnoses, plan, and follow up with the patient and they expressed understanding.  Patient expressed understanding of the importance of proper follow up care.   Clent Demark Renell Allum M.D. Diseases & Surgery of the Retina and Vitreous Retina &  Diabetic [place] 01/16/21     Abbreviations: M myopia (nearsighted); A astigmatism; H hyperopia (farsighted); P presbyopia; Mrx spectacle prescription;  CTL contact lenses; OD right eye; OS left eye; OU both eyes  XT exotropia; ET esotropia; PEK punctate epithelial keratitis; PEE punctate epithelial erosions; DES dry eye syndrome; MGD meibomian gland dysfunction; ATs artificial tears; PFAT's preservative free artificial tears; [place] nuclear sclerotic cataract; PSC posterior subcapsular cataract; ERM epi-retinal membrane; PVD posterior vitreous detachment; RD retinal detachment; DM diabetes mellitus; DR diabetic retinopathy; NPDR non-proliferative diabetic retinopathy; PDR proliferative diabetic retinopathy; CSME clinically significant macular edema; DME diabetic macular edema; dbh dot blot hemorrhages; CWS cotton wool spot; POAG primary open angle glaucoma; C/D cup-to-disc ratio; HVF humphrey visual field; GVF goldmann visual field; OCT optical coherence tomography; IOP intraocular pressure; BRVO Branch retinal vein occlusion; CRVO central retinal vein occlusion; CRAO central retinal artery occlusion; BRAO branch retinal artery occlusion; RT retinal tear; SB scleral buckle; PPV pars plana vitrectomy; VH Vitreous hemorrhage; PRP panretinal laser photocoagulation; IVK intravitreal kenalog; VMT vitreomacular traction; MH Macular hole;  NVD neovascularization of the disc; NVE neovascularization elsewhere; AREDS age related eye disease study; ARMD age related macular degeneration; POAG primary open angle glaucoma; EBMD epithelial/anterior basement membrane dystrophy; ACIOL anterior chamber intraocular lens; IOL intraocular lens; PCIOL posterior chamber intraocular lens; Phaco/IOL phacoemulsification with intraocular lens placement; [place] photorefractive keratectomy; LASIK laser assisted in situ keratomileusis; HTN hypertension; DM diabetes mellitus; COPD chronic obstructive pulmonary disease

## 2021-05-15 ENCOUNTER — Encounter (INDEPENDENT_AMBULATORY_CARE_PROVIDER_SITE_OTHER): Payer: Medicare Other | Admitting: Ophthalmology

## 2021-05-16 ENCOUNTER — Encounter (INDEPENDENT_AMBULATORY_CARE_PROVIDER_SITE_OTHER): Payer: Medicare Other | Admitting: Ophthalmology

## 2021-06-08 ENCOUNTER — Ambulatory Visit (INDEPENDENT_AMBULATORY_CARE_PROVIDER_SITE_OTHER): Payer: Medicare Other | Admitting: Ophthalmology

## 2021-06-08 ENCOUNTER — Other Ambulatory Visit: Payer: Self-pay

## 2021-06-08 ENCOUNTER — Encounter (INDEPENDENT_AMBULATORY_CARE_PROVIDER_SITE_OTHER): Payer: Self-pay | Admitting: Ophthalmology

## 2021-06-08 DIAGNOSIS — H353112 Nonexudative age-related macular degeneration, right eye, intermediate dry stage: Secondary | ICD-10-CM

## 2021-06-08 DIAGNOSIS — H353221 Exudative age-related macular degeneration, left eye, with active choroidal neovascularization: Secondary | ICD-10-CM | POA: Diagnosis not present

## 2021-06-08 DIAGNOSIS — H353222 Exudative age-related macular degeneration, left eye, with inactive choroidal neovascularization: Secondary | ICD-10-CM

## 2021-06-08 NOTE — Assessment & Plan Note (Signed)
OD Minor progression, stable no CNVM

## 2021-06-08 NOTE — Assessment & Plan Note (Signed)
No signs of CNVM recurrence or reactivation

## 2021-06-08 NOTE — Progress Notes (Signed)
06/08/2021     CHIEF COMPLAINT Patient presents for Retina Follow Up (4 month fu Ou and OCT/FP/Pt states VA OU stable since last visit. Pt denies FOL, floaters, or ocular pain OU. /)   HISTORY OF PRESENT ILLNESS: Kimberly Maynard is a 87 y.o. female who presents to the clinic today for:   HPI     Retina Follow Up           Diagnosis: Wet AMD   Laterality: left eye   Onset: 4 months ago   Severity: mild   Duration: 4 months   Course: stable   Comments: 4 month fu Ou and OCT/FP Pt states VA OU stable since last visit. Pt denies FOL, floaters, or ocular pain OU.         Last edited by Kimberly Maynard, COA on 06/08/2021  9:09 AM.      Referring physician: Jilda Panda, MD [address] [place],  [place] [address]  HISTORICAL INFORMATION:   Selected notes from the MEDICAL RECORD NUMBER    Lab Results  Component Value Date   HGBA1C 5.2 12/01/2018     CURRENT MEDICATIONS: No current outpatient medications on file. (Ophthalmic Drugs)   No current facility-administered medications for this visit. (Ophthalmic Drugs)   Current Outpatient Medications (Other)  Medication Sig   acetaminophen (TYLENOL) 650 MG CR tablet Take 1 tablet (650 mg total) by mouth as needed for pain.   amLODipine (NORVASC) 10 MG tablet Take 1 tablet (10 mg total) by mouth daily.   DULoxetine (CYMBALTA) 20 MG capsule Take 1 capsule (20 mg total) by mouth daily.   EUTHYROX 75 MCG tablet Take 1 tablet by mouth once daily (Patient taking differently: Take 75 mcg by mouth daily. )   meloxicam (MOBIC) 7.5 MG tablet Take 7.5 mg by mouth daily.   simethicone (MYLICON) 833 MG chewable tablet Chew 125 mg by mouth every 6 (six) hours as needed for flatulence.   sodium chloride (AYR) 0.65 % nasal spray Place 1 spray into the nose as needed for congestion.   traMADol (ULTRAM) 50 MG tablet Take 50 mg by mouth every 12 (twelve) hours as needed for moderate pain.    No current facility-administered medications for  this visit. (Other)      REVIEW OF SYSTEMS:    ALLERGIES Allergies  Allergen Reactions   Carisoprodol Other (See Comments)    Unknown reaction   Cyclobenzaprine Hcl Other (See Comments)    Unknown reaction   Penicillins Itching    PAST MEDICAL HISTORY Past Medical History:  Diagnosis Date   ANEMIA 01/15/2008   resolved   ANEURYSM NOS 08/30/2006   offered imaging to assess stability, no guidelines for this, she declined   DEPRESSION 05/05/2007   FASTING HYPERGLYCEMIA 05/05/2007   HYPERLIPIDEMIA 01/15/2008   HYPOTHYROIDISM 08/30/2006   OSTEOPENIA 08/30/2006   PULMONARY NODULE, SOLITARY 02/17/2009   completed follow up, stable   Skin cancer    Splenic artery aneurysm - last CT 2012 stable 08/16/2014   Past Surgical History:  Procedure Laterality Date   ABDOMINAL HYSTERECTOMY     OOPHORECTOMY      FAMILY HISTORY Family History  Problem Relation Age of Onset   Diabetes Father    Diabetes Sister    Cancer Brother        pancreatic, prostate   Diabetes Brother     SOCIAL HISTORY Social History   Tobacco Use   Smoking status: Never   Smokeless tobacco: Never  Substance  Use Topics   Alcohol use: No   Drug use: No         OPHTHALMIC EXAM:  Base Eye Exam     Visual Acuity (ETDRS)       Right Left   Dist cc 20/50 -2 20/400   Dist ph cc NI NI         Tonometry (Tonopen, 9:12 AM)       Right Left   Pressure 12 11         Pupils       Pupils Dark Light Shape React APD   Right PERRL 5 4 Round Brisk None   Left PERRL 5 4 Round Brisk None         Visual Fields (Counting fingers)       Left Right    Full Full         Extraocular Movement       Right Left    Full Full         Neuro/Psych     Oriented x3: Yes   Mood/Affect: Normal         Dilation     Both eyes: 1.0% Mydriacyl, 2.5% Phenylephrine @ 9:13 AM           Slit Lamp and Fundus Exam     External Exam       Right Left   External Normal Normal          Slit Lamp Exam       Right Left   Lids/Lashes Normal Normal   Conjunctiva/Sclera White and quiet White and quiet   Cornea Clear Clear   Anterior Chamber Deep and quiet Deep and quiet   Iris Round and reactive Round and reactive   Lens Posterior chamber intraocular lens Posterior chamber intraocular lens   Anterior Vitreous Normal Normal         Fundus Exam       Right Left   Posterior Vitreous Posterior vitreous detachment Posterior vitreous detachment   Disc Normal Peripapillary atrophy   C/D Ratio 0.0 0.2   Macula Hard drusen, Pigmented atrophy, no hemorrhage, no membrane, Retinal pigment epithelial mottling, no macular thickening  now fibrotic  White subfoveal disciform scar, Retinal pigment epithelial mottling,  Less Cystoid macular edema, Macular thickening, Retinal pigment epithelial atrophy, Geographic atrophy,, Disciform scar, no subretinal hemorrhage or fluid   Vessels Normal Normal   Periphery Normal Normal            IMAGING AND PROCEDURES  Imaging and Procedures for 06/08/21  OCT, Retina - OU - Both Eyes       Right Eye Quality was good. Scan locations included subfoveal. Central Foveal Thickness: 271. Progression has been stable. Findings include abnormal foveal contour, no SRF, no IRF.   Left Eye Quality was good. Scan locations included subfoveal. Central Foveal Thickness: 313. Progression has improved. Findings include subretinal hyper-reflective material, disciform scar, outer retinal tubulation.   Notes Much less active CNVM subfoveal left eye, much less intraretinal fluid with outer retinal tubulation's     Color Fundus Photography Optos - OU - Both Eyes       Right Eye Progression has no prior data. Disc findings include normal observations. Macula : drusen. Vessels : normal observations. Periphery : normal observations.   Left Eye Progression has no prior data. Disc findings include normal observations. Macula : geographic atrophy. Vessels :  normal observations. Periphery : normal observations.   Notes No active CNVM OD  OS with subfoveal white Disciform scar, stable              ASSESSMENT/PLAN:  Exudative age-related macular degeneration of left eye with inactive choroidal neovascularization (HCC) No signs of CNVM recurrence or reactivation  Intermediate stage nonexudative age-related macular degeneration of right eye OD Minor progression, stable no CNVM     ICD-10-CM   1. Exudative age-related macular degeneration of left eye with active choroidal neovascularization (HCC)  H35.3221 OCT, Retina - OU - Both Eyes    Color Fundus Photography Optos - OU - Both Eyes    2. Exudative age-related macular degeneration of left eye with inactive choroidal neovascularization ([place])  H35.3222     3. Intermediate stage nonexudative age-related macular degeneration of right eye  H35.3112       1.  Active disease left eye no longer exists, stable CNVM will observe no therapy indicated  2.  No sign of CNVM OD  3.  Ophthalmic Meds Ordered this visit:  No orders of the defined types were placed in this encounter.      Return in about 6 months (around 12/09/2021) for DILATE OU, COLOR FP, OCT.  There are no Patient Instructions on file for this visit.   Explained the diagnoses, plan, and follow up with the patient and they expressed understanding.  Patient expressed understanding of the importance of proper follow up care.   Clent Demark Ardon Franklin M.D. Diseases & Surgery of the Retina and Vitreous Retina & Diabetic [place] 06/08/21     Abbreviations: M myopia (nearsighted); A astigmatism; H hyperopia (farsighted); P presbyopia; Mrx spectacle prescription;  CTL contact lenses; OD right eye; OS left eye; OU both eyes  XT exotropia; ET esotropia; PEK punctate epithelial keratitis; PEE punctate epithelial erosions; DES dry eye syndrome; MGD meibomian gland dysfunction; ATs artificial tears; PFAT's preservative free  artificial tears; [place] nuclear sclerotic cataract; PSC posterior subcapsular cataract; ERM epi-retinal membrane; PVD posterior vitreous detachment; RD retinal detachment; DM diabetes mellitus; DR diabetic retinopathy; NPDR non-proliferative diabetic retinopathy; PDR proliferative diabetic retinopathy; CSME clinically significant macular edema; DME diabetic macular edema; dbh dot blot hemorrhages; CWS cotton wool spot; POAG primary open angle glaucoma; C/D cup-to-disc ratio; HVF humphrey visual field; GVF goldmann visual field; OCT optical coherence tomography; IOP intraocular pressure; BRVO Branch retinal vein occlusion; CRVO central retinal vein occlusion; CRAO central retinal artery occlusion; BRAO branch retinal artery occlusion; RT retinal tear; SB scleral buckle; PPV pars plana vitrectomy; VH Vitreous hemorrhage; PRP panretinal laser photocoagulation; IVK intravitreal kenalog; VMT vitreomacular traction; MH Macular hole;  NVD neovascularization of the disc; NVE neovascularization elsewhere; AREDS age related eye disease study; ARMD age related macular degeneration; POAG primary open angle glaucoma; EBMD epithelial/anterior basement membrane dystrophy; ACIOL anterior chamber intraocular lens; IOL intraocular lens; PCIOL posterior chamber intraocular lens; Phaco/IOL phacoemulsification with intraocular lens placement; [place] photorefractive keratectomy; LASIK laser assisted in situ keratomileusis; HTN hypertension; DM diabetes mellitus; COPD chronic obstructive pulmonary disease

## 2021-12-14 ENCOUNTER — Encounter (INDEPENDENT_AMBULATORY_CARE_PROVIDER_SITE_OTHER): Payer: Medicare Other | Admitting: Ophthalmology

## 2021-12-21 ENCOUNTER — Encounter (INDEPENDENT_AMBULATORY_CARE_PROVIDER_SITE_OTHER): Payer: Medicare (Managed Care) | Admitting: Ophthalmology

## 2022-01-08 ENCOUNTER — Encounter (INDEPENDENT_AMBULATORY_CARE_PROVIDER_SITE_OTHER): Payer: Medicare (Managed Care) | Admitting: Ophthalmology

## 2022-01-08 ENCOUNTER — Encounter (INDEPENDENT_AMBULATORY_CARE_PROVIDER_SITE_OTHER): Payer: Self-pay

## 2022-01-22 ENCOUNTER — Ambulatory Visit (INDEPENDENT_AMBULATORY_CARE_PROVIDER_SITE_OTHER): Payer: Medicare (Managed Care) | Admitting: Ophthalmology

## 2022-01-22 ENCOUNTER — Other Ambulatory Visit: Payer: Self-pay

## 2022-01-22 ENCOUNTER — Encounter (INDEPENDENT_AMBULATORY_CARE_PROVIDER_SITE_OTHER): Payer: Self-pay | Admitting: Ophthalmology

## 2022-01-22 DIAGNOSIS — H353221 Exudative age-related macular degeneration, left eye, with active choroidal neovascularization: Secondary | ICD-10-CM

## 2022-01-22 DIAGNOSIS — H353222 Exudative age-related macular degeneration, left eye, with inactive choroidal neovascularization: Secondary | ICD-10-CM | POA: Diagnosis not present

## 2022-01-22 DIAGNOSIS — H353112 Nonexudative age-related macular degeneration, right eye, intermediate dry stage: Secondary | ICD-10-CM | POA: Diagnosis not present

## 2022-01-22 NOTE — Progress Notes (Signed)
01/22/2022     CHIEF COMPLAINT Patient presents for  Chief Complaint  Patient presents with   Macular Degeneration      HISTORY OF PRESENT ILLNESS: Kimberly Maynard is a 88 y.o. female who presents to the clinic today for:   HPI   6 mos fu ou oct fp. Patient states vision is stable and unchanged since last visit. Denies any new floaters or FOL.   Last edited by Hurman Horn, MD on 01/22/2022 11:15 AM.      Referring physician: Jilda Panda, MD [name],  [place] [address]  HISTORICAL INFORMATION:   Selected notes from the MEDICAL RECORD NUMBER    Lab Results  Component Value Date   HGBA1C 5.2 12/01/2018     CURRENT MEDICATIONS: No current outpatient medications on file. (Ophthalmic Drugs)   No current facility-administered medications for this visit. (Ophthalmic Drugs)   Current Outpatient Medications (Other)  Medication Sig   acetaminophen (TYLENOL) 650 MG CR tablet Take 1 tablet (650 mg total) by mouth as needed for pain.   amLODipine (NORVASC) 10 MG tablet Take 1 tablet (10 mg total) by mouth daily.   DULoxetine (CYMBALTA) 20 MG capsule Take 1 capsule (20 mg total) by mouth daily.   EUTHYROX 75 MCG tablet Take 1 tablet by mouth once daily (Patient taking differently: Take 75 mcg by mouth daily. )   meloxicam (MOBIC) 7.5 MG tablet Take 7.5 mg by mouth daily.   simethicone (MYLICON) 616 MG chewable tablet Chew 125 mg by mouth every 6 (six) hours as needed for flatulence.   sodium chloride (AYR) 0.65 % nasal spray Place 1 spray into the nose as needed for congestion.   traMADol (ULTRAM) 50 MG tablet Take 50 mg by mouth every 12 (twelve) hours as needed for moderate pain.    No current facility-administered medications for this visit. (Other)      REVIEW OF SYSTEMS: ROS   Negative for: Constitutional, Gastrointestinal, Neurological, Skin, Genitourinary, Musculoskeletal, HENT, Endocrine, Cardiovascular, Eyes, Respiratory, Psychiatric,  Allergic/Imm, Heme/Lymph Last edited by Hurman Horn, MD on 01/22/2022 11:15 AM.       ALLERGIES Allergies  Allergen Reactions   Carisoprodol Other (See Comments)    Unknown reaction   Cyclobenzaprine Hcl Other (See Comments)    Unknown reaction   Penicillins Itching    PAST MEDICAL HISTORY Past Medical History:  Diagnosis Date   ANEMIA 01/15/2008   resolved   ANEURYSM NOS 08/30/2006   offered imaging to assess stability, no guidelines for this, she declined   DEPRESSION 05/05/2007   FASTING HYPERGLYCEMIA 05/05/2007   HYPERLIPIDEMIA 01/15/2008   HYPOTHYROIDISM 08/30/2006   OSTEOPENIA 08/30/2006   PULMONARY NODULE, SOLITARY 02/17/2009   completed follow up, stable   Skin cancer    Splenic artery aneurysm - last CT 2012 stable 08/16/2014   Past Surgical History:  Procedure Laterality Date   ABDOMINAL HYSTERECTOMY     OOPHORECTOMY      FAMILY HISTORY Family History  Problem Relation Age of Onset   Diabetes Father    Diabetes Sister    Cancer Brother        pancreatic, prostate   Diabetes Brother     SOCIAL HISTORY Social History   Tobacco Use   Smoking status: Never   Smokeless tobacco: Never  Substance Use Topics   Alcohol use: No   Drug use: No         OPHTHALMIC EXAM:  Base Eye Exam  Visual Acuity (ETDRS)       Right Left   Dist cc 20/50 -2 20/200 -1   Dist ph cc NI NI    Correction: Glasses         Tonometry (Tonopen, 10:11 AM)       Right Left   Pressure 14 12         Pupils       Pupils Dark Light APD   Right PERRL 5 4 None   Left PERRL 5 4 None         Visual Fields (Counting fingers)       Left Right    Full Full         Neuro/Psych     Oriented x3: Yes   Mood/Affect: Normal         Dilation     Both eyes: 1.0% Mydriacyl, 2.5% Phenylephrine @ 10:11 AM           Slit Lamp and Fundus Exam     External Exam       Right Left   External Normal Normal         Slit Lamp Exam       Right Left    Lids/Lashes Normal Normal   Conjunctiva/Sclera White and quiet White and quiet   Cornea Clear Clear   Anterior Chamber Deep and quiet Deep and quiet   Iris Round and reactive Round and reactive   Lens Posterior chamber intraocular lens Posterior chamber intraocular lens   Anterior Vitreous Normal Normal         Fundus Exam       Right Left   Posterior Vitreous Posterior vitreous detachment Posterior vitreous detachment   Disc Normal Peripapillary atrophy   C/D Ratio 0.0 0.2   Macula Hard drusen, Pigmented atrophy, no hemorrhage, no membrane, Retinal pigment epithelial mottling, no macular thickening  now fibrotic  White subfoveal disciform scar, Retinal pigment epithelial mottling,  Less Cystoid macular edema, Macular thickening, Retinal pigment epithelial atrophy, Geographic atrophy,, Disciform scar, no subretinal hemorrhage or fluid   Vessels Normal Normal   Periphery Normal Normal            IMAGING AND PROCEDURES  Imaging and Procedures for 01/22/22  OCT, Retina - OU - Both Eyes       Right Eye Quality was good. Scan locations included subfoveal. Central Foveal Thickness: 268. Progression has been stable. Findings include abnormal foveal contour, no SRF, no IRF.   Left Eye Quality was good. Scan locations included subfoveal. Central Foveal Thickness: 436. Progression has improved. Findings include subretinal hyper-reflective material, disciform scar, outer retinal tubulation.   Notes Much less active CNVM subfoveal left eye, much less intraretinal fluid with outer retinal tubulation's no interval change     Color Fundus Photography Optos - OU - Both Eyes       Right Eye Progression has no prior data. Disc findings include normal observations. Macula : drusen. Vessels : normal observations. Periphery : normal observations.   Left Eye Progression has no prior data. Disc findings include normal observations. Macula : geographic atrophy. Vessels : normal  observations. Periphery : normal observations.   Notes No active CNVM OD  OS with subfoveal white Disciform scar, stable              ASSESSMENT/PLAN:  Exudative age-related macular degeneration of left eye with inactive choroidal neovascularization (HCC) No signs of reactive edges, stable over time  Intermediate stage nonexudative age-related macular degeneration  of right eye No signs of CNVM development observed     ICD-10-CM   1. Exudative age-related macular degeneration of left eye with active choroidal neovascularization (HCC)  H35.3221 OCT, Retina - OU - Both Eyes    Color Fundus Photography Optos - OU - Both Eyes    2. Exudative age-related macular degeneration of left eye with inactive choroidal neovascularization ([place])  H35.3222     3. Intermediate stage nonexudative age-related macular degeneration of right eye  H35.3112       1.  OD, no signs of active CNVM will observe  2.  OS no signs of CNVM disciform scar subfoveal activation, no sign of risk of Enlargement will observe  3.  Ophthalmic Meds Ordered this visit:  No orders of the defined types were placed in this encounter.      Return in about 6 months (around 07/22/2022) for DILATE OU, COLOR FP, OCT.  There are no Patient Instructions on file for this visit.   Explained the diagnoses, plan, and follow up with the patient and they expressed understanding.  Patient expressed understanding of the importance of proper follow up care.   Clent Demark Veron Senner M.D. Diseases & Surgery of the Retina and Vitreous Retina & Diabetic [place] 01/22/22     Abbreviations: M myopia (nearsighted); A astigmatism; H hyperopia (farsighted); P presbyopia; Mrx spectacle prescription;  CTL contact lenses; OD right eye; OS left eye; OU both eyes  XT exotropia; ET esotropia; PEK punctate epithelial keratitis; PEE punctate epithelial erosions; DES dry eye syndrome; MGD meibomian gland dysfunction; ATs artificial tears;  PFAT's preservative free artificial tears; [place] nuclear sclerotic cataract; PSC posterior subcapsular cataract; ERM epi-retinal membrane; PVD posterior vitreous detachment; RD retinal detachment; DM diabetes mellitus; DR diabetic retinopathy; NPDR non-proliferative diabetic retinopathy; PDR proliferative diabetic retinopathy; CSME clinically significant macular edema; DME diabetic macular edema; dbh dot blot hemorrhages; CWS cotton wool spot; POAG primary open angle glaucoma; C/D cup-to-disc ratio; HVF humphrey visual field; GVF goldmann visual field; OCT optical coherence tomography; IOP intraocular pressure; BRVO Branch retinal vein occlusion; CRVO central retinal vein occlusion; CRAO central retinal artery occlusion; BRAO branch retinal artery occlusion; RT retinal tear; SB scleral buckle; PPV pars plana vitrectomy; VH Vitreous hemorrhage; PRP panretinal laser photocoagulation; IVK intravitreal kenalog; VMT vitreomacular traction; MH Macular hole;  NVD neovascularization of the disc; NVE neovascularization elsewhere; AREDS age related eye disease study; ARMD age related macular degeneration; POAG primary open angle glaucoma; EBMD epithelial/anterior basement membrane dystrophy; ACIOL anterior chamber intraocular lens; IOL intraocular lens; PCIOL posterior chamber intraocular lens; Phaco/IOL phacoemulsification with intraocular lens placement; [place] photorefractive keratectomy; LASIK laser assisted in situ keratomileusis; HTN hypertension; DM diabetes mellitus; COPD chronic obstructive pulmonary disease

## 2022-01-22 NOTE — Assessment & Plan Note (Signed)
No signs of CNVM development observed

## 2022-01-22 NOTE — Assessment & Plan Note (Signed)
No signs of reactive edges, stable over time

## 2022-07-23 ENCOUNTER — Ambulatory Visit (INDEPENDENT_AMBULATORY_CARE_PROVIDER_SITE_OTHER): Payer: Medicare (Managed Care) | Admitting: Ophthalmology

## 2022-07-23 ENCOUNTER — Encounter (INDEPENDENT_AMBULATORY_CARE_PROVIDER_SITE_OTHER): Payer: Self-pay | Admitting: Ophthalmology

## 2022-07-23 DIAGNOSIS — H353221 Exudative age-related macular degeneration, left eye, with active choroidal neovascularization: Secondary | ICD-10-CM

## 2022-07-23 DIAGNOSIS — H353112 Nonexudative age-related macular degeneration, right eye, intermediate dry stage: Secondary | ICD-10-CM | POA: Diagnosis not present

## 2022-07-23 DIAGNOSIS — H43813 Vitreous degeneration, bilateral: Secondary | ICD-10-CM | POA: Insufficient documentation

## 2022-07-23 DIAGNOSIS — Z961 Presence of intraocular lens: Secondary | ICD-10-CM

## 2022-07-23 DIAGNOSIS — H353222 Exudative age-related macular degeneration, left eye, with inactive choroidal neovascularization: Secondary | ICD-10-CM | POA: Diagnosis not present

## 2022-07-23 NOTE — Assessment & Plan Note (Signed)
Stable over time accounts for acuity

## 2022-07-23 NOTE — Assessment & Plan Note (Signed)
No sign of CNVM OD with stable acuity

## 2022-07-23 NOTE — Assessment & Plan Note (Signed)
Stable OU 

## 2022-07-23 NOTE — Assessment & Plan Note (Signed)
Physiologic OU 

## 2022-07-23 NOTE — Progress Notes (Signed)
07/23/2022     CHIEF COMPLAINT Patient presents for  Chief Complaint  Patient presents with   Macular Degeneration      HISTORY OF PRESENT ILLNESS: Kimberly Maynard is a 88 y.o. female who presents to the clinic today for:   HPI   6 mths dilate ou color fp oct  Pt states her vision has been stable Pt denies any new floaters or FOL  Last edited by Morene Rankins, CMA on 07/23/2022  9:58 AM.      Referring physician: Jilda Panda, MD [address] [place],  [place] [address]  HISTORICAL INFORMATION:   Selected notes from the MEDICAL RECORD NUMBER    Lab Results  Component Value Date   HGBA1C 5.2 12/01/2018     CURRENT MEDICATIONS: No current outpatient medications on file. (Ophthalmic Drugs)   No current facility-administered medications for this visit. (Ophthalmic Drugs)   Current Outpatient Medications (Other)  Medication Sig   acetaminophen (TYLENOL) 650 MG CR tablet Take 1 tablet (650 mg total) by mouth as needed for pain.   amLODipine (NORVASC) 10 MG tablet Take 1 tablet (10 mg total) by mouth daily.   DULoxetine (CYMBALTA) 20 MG capsule Take 1 capsule (20 mg total) by mouth daily.   EUTHYROX 75 MCG tablet Take 1 tablet by mouth once daily (Patient taking differently: Take 75 mcg by mouth daily. )   meloxicam (MOBIC) 7.5 MG tablet Take 7.5 mg by mouth daily.   simethicone (MYLICON) 102 MG chewable tablet Chew 125 mg by mouth every 6 (six) hours as needed for flatulence.   sodium chloride (AYR) 0.65 % nasal spray Place 1 spray into the nose as needed for congestion.   traMADol (ULTRAM) 50 MG tablet Take 50 mg by mouth every 12 (twelve) hours as needed for moderate pain.    No current facility-administered medications for this visit. (Other)      REVIEW OF SYSTEMS: ROS   Negative for: Constitutional, Gastrointestinal, Neurological, Skin, Genitourinary, Musculoskeletal, HENT, Endocrine, Cardiovascular, Eyes, Respiratory, Psychiatric, Allergic/Imm,  Heme/Lymph Last edited by Hurman Horn, MD on 07/23/2022 10:48 AM.       ALLERGIES Allergies  Allergen Reactions   Carisoprodol Other (See Comments)    Unknown reaction   Cyclobenzaprine Hcl Other (See Comments)    Unknown reaction   Penicillins Itching    PAST MEDICAL HISTORY Past Medical History:  Diagnosis Date   ANEMIA 01/15/2008   resolved   ANEURYSM NOS 08/30/2006   offered imaging to assess stability, no guidelines for this, she declined   DEPRESSION 05/05/2007   FASTING HYPERGLYCEMIA 05/05/2007   HYPERLIPIDEMIA 01/15/2008   HYPOTHYROIDISM 08/30/2006   OSTEOPENIA 08/30/2006   PULMONARY NODULE, SOLITARY 02/17/2009   completed follow up, stable   Skin cancer    Splenic artery aneurysm - last CT 2012 stable 08/16/2014   Past Surgical History:  Procedure Laterality Date   ABDOMINAL HYSTERECTOMY     OOPHORECTOMY      FAMILY HISTORY Family History  Problem Relation Age of Onset   Diabetes Father    Diabetes Sister    Cancer Brother        pancreatic, prostate   Diabetes Brother     SOCIAL HISTORY Social History   Tobacco Use   Smoking status: Never   Smokeless tobacco: Never  Substance Use Topics   Alcohol use: No   Drug use: No         OPHTHALMIC EXAM:  Base Eye Exam  Visual Acuity (ETDRS)       Right Left   Dist cc 20/50 CF at 1'    Correction: Glasses         Tonometry (Tonopen, 10:04 AM)       Right Left   Pressure 9 16         Pupils       Pupils APD   Right PERRL None   Left PERRL None         Visual Fields       Left Right     Full   Restrictions Partial inner superior temporal, inferior temporal, superior nasal, inferior nasal deficiencies          Extraocular Movement       Right Left    Ortho Ortho    -- -- --  --  --  -- -- --   -- -- --  --  --  -- -- --           Neuro/Psych     Oriented x3: Yes   Mood/Affect: Normal         Dilation     Both eyes: 1.0% Mydriacyl, 2.5% Phenylephrine  @ 9:59 AM           Slit Lamp and Fundus Exam     External Exam       Right Left   External Normal Normal         Slit Lamp Exam       Right Left   Lids/Lashes Normal Normal   Conjunctiva/Sclera White and quiet White and quiet   Cornea Clear Clear   Anterior Chamber Deep and quiet Deep and quiet   Iris Round and reactive Round and reactive   Lens Posterior chamber intraocular lens Posterior chamber intraocular lens   Anterior Vitreous Normal Normal         Fundus Exam       Right Left   Posterior Vitreous Posterior vitreous detachment Posterior vitreous detachment   Disc Normal Peripapillary atrophy   C/D Ratio 0.0 0.2   Macula Hard drusen, Pigmented atrophy, no hemorrhage, no membrane, Retinal pigment epithelial mottling, no macular thickening  now fibrotic  White subfoveal disciform scar, Retinal pigment epithelial mottling,  Less Cystoid macular edema, Macular thickening, Retinal pigment epithelial atrophy, Geographic atrophy,, Disciform scar, no subretinal hemorrhage or fluid   Vessels Normal Normal   Periphery Normal Normal            IMAGING AND PROCEDURES  Imaging and Procedures for 07/23/22  OCT, Retina - OU - Both Eyes       Right Eye Quality was good. Scan locations included subfoveal. Central Foveal Thickness: 279. Progression has been stable. Findings include no IRF, no SRF, abnormal foveal contour.   Left Eye Quality was good. Scan locations included subfoveal. Central Foveal Thickness: 436. Progression has improved. Findings include outer retinal tubulation, subretinal hyper-reflective material, disciform scar.   Notes Much less active CNVM subfoveal left eye, much less intraretinal fluid with outer retinal tubulation's no interval change, now white subfoveal disciform scar      Color Fundus Photography Optos - OU - Both Eyes       Right Eye Progression has no prior data. Disc findings include normal observations. Macula : drusen.  Vessels : normal observations. Periphery : normal observations.   Left Eye Progression has no prior data. Disc findings include normal observations. Macula : geographic atrophy. Vessels : normal observations. Periphery : normal  observations.   Notes No active CNVM OD  OS with subfoveal white Disciform scar, stable               ASSESSMENT/PLAN:  Exudative age-related macular degeneration of left eye with inactive choroidal neovascularization (HCC) Stable over time accounts for acuity  Intermediate stage nonexudative age-related macular degeneration of right eye No sign of CNVM OD with stable acuity  Pseudophakia, both eyes Stable OU  Posterior vitreous detachment, both eyes Physiologic OU     ICD-10-CM   1. Exudative age-related macular degeneration of left eye with active choroidal neovascularization (HCC)  H35.3221 OCT, Retina - OU - Both Eyes    Color Fundus Photography Optos - OU - Both Eyes    2. Exudative age-related macular degeneration of left eye with inactive choroidal neovascularization ([place])  H35.3222     3. Intermediate stage nonexudative age-related macular degeneration of right eye  H35.3112     4. Pseudophakia, both eyes  Z96.1     5. Posterior vitreous detachment, both eyes  H43.813       1.  OS with history of active CNVM no signs of activity today cicatricial scarring subfoveal accounts for acuity.  2.  OD no sign of new CNVM will continue to monitor and observe  3.  Ophthalmic Meds Ordered this visit:  No orders of the defined types were placed in this encounter.      Return in about 6 months (around 01/23/2023) for DILATE OU, COLOR FP, OCT.  There are no Patient Instructions on file for this visit.   Explained the diagnoses, plan, and follow up with the patient and they expressed understanding.  Patient expressed understanding of the importance of proper follow up care.   Clent Demark Honesti Seaberg M.D. Diseases & Surgery of the Retina and  Vitreous Retina & Diabetic [place] 07/23/22     Abbreviations: M myopia (nearsighted); A astigmatism; H hyperopia (farsighted); P presbyopia; Mrx spectacle prescription;  CTL contact lenses; OD right eye; OS left eye; OU both eyes  XT exotropia; ET esotropia; PEK punctate epithelial keratitis; PEE punctate epithelial erosions; DES dry eye syndrome; MGD meibomian gland dysfunction; ATs artificial tears; PFAT's preservative free artificial tears; [place] nuclear sclerotic cataract; PSC posterior subcapsular cataract; ERM epi-retinal membrane; PVD posterior vitreous detachment; RD retinal detachment; DM diabetes mellitus; DR diabetic retinopathy; NPDR non-proliferative diabetic retinopathy; PDR proliferative diabetic retinopathy; CSME clinically significant macular edema; DME diabetic macular edema; dbh dot blot hemorrhages; CWS cotton wool spot; POAG primary open angle glaucoma; C/D cup-to-disc ratio; HVF humphrey visual field; GVF goldmann visual field; OCT optical coherence tomography; IOP intraocular pressure; BRVO Branch retinal vein occlusion; CRVO central retinal vein occlusion; CRAO central retinal artery occlusion; BRAO branch retinal artery occlusion; RT retinal tear; SB scleral buckle; PPV pars plana vitrectomy; VH Vitreous hemorrhage; PRP panretinal laser photocoagulation; IVK intravitreal kenalog; VMT vitreomacular traction; MH Macular hole;  NVD neovascularization of the disc; NVE neovascularization elsewhere; AREDS age related eye disease study; ARMD age related macular degeneration; POAG primary open angle glaucoma; EBMD epithelial/anterior basement membrane dystrophy; ACIOL anterior chamber intraocular lens; IOL intraocular lens; PCIOL posterior chamber intraocular lens; Phaco/IOL phacoemulsification with intraocular lens placement; [place] photorefractive keratectomy; LASIK laser assisted in situ keratomileusis; HTN hypertension; DM diabetes mellitus; COPD chronic obstructive pulmonary disease

## 2023-01-23 ENCOUNTER — Encounter (INDEPENDENT_AMBULATORY_CARE_PROVIDER_SITE_OTHER): Payer: Medicare (Managed Care) | Admitting: Ophthalmology

## 2023-07-10 ENCOUNTER — Other Ambulatory Visit: Payer: Self-pay

## 2023-07-10 ENCOUNTER — Encounter (HOSPITAL_BASED_OUTPATIENT_CLINIC_OR_DEPARTMENT_OTHER): Payer: Self-pay | Admitting: Emergency Medicine

## 2023-07-10 ENCOUNTER — Emergency Department (HOSPITAL_BASED_OUTPATIENT_CLINIC_OR_DEPARTMENT_OTHER)
Admission: EM | Admit: 2023-07-10 | Discharge: 2023-07-11 | Disposition: A | Discharge status: Home / Self Care | Payer: Medicare (Managed Care) | Attending: Emergency Medicine | Admitting: Emergency Medicine

## 2023-07-10 DIAGNOSIS — E039 Hypothyroidism, unspecified: Secondary | ICD-10-CM | POA: Diagnosis not present

## 2023-07-10 DIAGNOSIS — R21 Rash and other nonspecific skin eruption: Secondary | ICD-10-CM | POA: Insufficient documentation

## 2023-07-10 DIAGNOSIS — Z79899 Other long term (current) drug therapy: Secondary | ICD-10-CM | POA: Insufficient documentation

## 2023-07-10 NOTE — ED Triage Notes (Signed)
Pt in with itchy rash to R shin and L calf. Pt states an itchy rash began a week ago to her low back, and she was prescribed Triamcinolone cream and it has improved. Rash to legs appears different to her. Pt does have scab to R shin, denies fevers/chills

## 2023-07-11 MED ORDER — PREDNISONE 20 MG PO TABS
ORAL_TABLET | ORAL | 0 refills | Status: DC
Start: 2023-07-11 — End: 2024-03-15

## 2023-07-11 MED ORDER — PREDNISONE 20 MG PO TABS
40.0000 mg | ORAL_TABLET | Freq: Once | ORAL | Status: AC
  Administered 2023-07-11: 40 mg via ORAL
  Filled 2023-07-11: qty 2

## 2023-07-11 NOTE — ED Notes (Signed)
Reviewed AVS/discharge instruction with patient. Time allotted for and all questions answered. Patient is agreeable for d/c and escorted to ed exit by staff.  

## 2023-07-11 NOTE — ED Provider Notes (Signed)
[place] EMERGENCY DEPARTMENT AT [facility] Provider Note   CSN: [id number] Arrival date & time: 07/10/23  2043     History  Chief Complaint  Patient presents with   Rash    Kimberly Maynard is a 89 y.o. female.  The history is provided by the patient.  Rash She has history of hyperlipidemia, hypothyroidism and comes in with persistent rash on her legs.  She actually had a rash on her back which has mostly resolved, but about 2 weeks ago she started having rash on the anterior aspect of her right lower leg and is now spreading to the left leg.  It is very itchy.  She has seen her primary care provider who had ordered a course of doxycycline and topical triamcinolone.  However, rash is not improving with these.  She denies fever or chills.  It is not painful.   Home Medications Prior to Admission medications   Medication Sig Start Date End Date Taking? Authorizing Provider  acetaminophen (TYLENOL) 650 MG CR tablet Take 1 tablet (650 mg total) by mouth as needed for pain. 08/11/18   Terressa Koyanagi, DO  amLODipine (NORVASC) 10 MG tablet Take 1 tablet (10 mg total) by mouth daily. 09/22/20 09/22/21  Leroy Sea, MD  DULoxetine (CYMBALTA) 20 MG capsule Take 1 capsule (20 mg total) by mouth daily. 06/29/19   Wynn Banker, MD  EUTHYROX 75 MCG tablet Take 1 tablet by mouth once daily Patient taking differently: Take 75 mcg by mouth daily.  01/25/20   Koberlein, Paris Lore, MD  meloxicam (MOBIC) 7.5 MG tablet Take 7.5 mg by mouth daily. 08/07/20   [provider]  simethicone (MYLICON) 125 MG chewable tablet Chew 125 mg by mouth every 6 (six) hours as needed for flatulence.    [provider]  sodium chloride (AYR) 0.65 % nasal spray Place 1 spray into the nose as needed for congestion.    [provider]  traMADol (ULTRAM) 50 MG tablet Take 50 mg by mouth every 12 (twelve) hours as needed for moderate pain.  08/08/20   [provider]       Allergies    Carisoprodol, Cyclobenzaprine hcl, and Penicillins    Review of Systems   Review of Systems  Skin:  Positive for rash.  All other systems reviewed and are negative.   Physical Exam Updated Vital Signs BP (!) 139/56   Pulse 65   Temp 98.1 F (36.7 C)   Resp 18   Wt 53.2 kg   SpO2 96%   BMI 20.13 kg/m  Physical Exam Vitals and nursing note reviewed.   89 year old female, resting comfortably and in no acute distress. Vital signs are normal. Oxygen saturation is 96%, which is normal. Head is normocephalic and atraumatic. PERRLA, EOMI.  Neck is nontender and supple without adenopathy or JVD. Back is nontender and there is no CVA tenderness. Lungs are clear without rales, wheezes, or rhonchi. Chest is nontender. Heart has regular rate and rhythm without murmur. Abdomen is soft, flat, nontender. Extremities have no cyanosis or edema, full range of motion is present. Skin is warm and dry.  Erythematous rash with small papules in the periphery present in the anteromedial aspect of the right lower leg and the posterior lateral aspect of the left lower leg.  Also, very faint papular rashes present over the back without any underlying erythema. Neurologic: Mental status is normal, cranial nerves are intact, there are no motor or sensory  deficits.        ED Results / Procedures / Treatments    Procedures Procedures    Medications Ordered in ED Medications  predniSONE (DELTASONE) tablet 40 mg (has no administration in time range)    ED Course/ Medical Decision Making/ A&P                                 Medical Decision Making  Pruritic rash of uncertain cause.  Some aspects of the rash look like contact dermatitis, but course is atypical for contact dermatitis.  No warmth or lymphangitic streaks to suggest cellulitis, no signs of venous stasis.  I feel that she will need evaluation by dermatologist.  In the meantime, I have ordered a dose of prednisone  and I am ordering a 4-day course of prednisone.  Final Clinical Impression(s) / ED Diagnoses Final diagnoses:  Rash    Rx / DC Orders ED Discharge Orders          Ordered    predniSONE (DELTASONE) 20 MG tablet        07/11/23 0104              Dione Booze, MD 07/11/23 0109

## 2023-07-11 NOTE — Discharge Instructions (Signed)
The cause for your rash is not clear.  I have ordered a prescription for prednisone which will help with inflammation, but it is not a medication that you can take for an extended period of time.  If the rash persists, I feel you should see a dermatologist.

## 2024-03-10 ENCOUNTER — Ambulatory Visit
Admission: RE | Admit: 2024-03-10 | Discharge: 2024-03-10 | Disposition: A | Discharge status: Home / Self Care | Source: Ambulatory Visit | Attending: Internal Medicine | Admitting: Internal Medicine

## 2024-03-10 ENCOUNTER — Other Ambulatory Visit: Payer: Self-pay | Admitting: Internal Medicine

## 2024-03-10 ENCOUNTER — Ambulatory Visit
Admission: RE | Admit: 2024-03-10 | Discharge: 2024-03-10 | Disposition: A | Discharge status: Home / Self Care | Payer: Self-pay | Source: Ambulatory Visit | Attending: Internal Medicine | Admitting: Internal Medicine

## 2024-03-10 DIAGNOSIS — M79671 Pain in right foot: Secondary | ICD-10-CM

## 2024-03-10 DIAGNOSIS — M25571 Pain in right ankle and joints of right foot: Secondary | ICD-10-CM

## 2024-03-15 ENCOUNTER — Emergency Department (HOSPITAL_BASED_OUTPATIENT_CLINIC_OR_DEPARTMENT_OTHER)
Admission: EM | Admit: 2024-03-15 | Discharge: 2024-03-16 | Disposition: A | Discharge status: Home / Self Care | Attending: Emergency Medicine | Admitting: Emergency Medicine

## 2024-03-15 ENCOUNTER — Other Ambulatory Visit: Payer: Self-pay

## 2024-03-15 ENCOUNTER — Encounter (HOSPITAL_BASED_OUTPATIENT_CLINIC_OR_DEPARTMENT_OTHER): Payer: Self-pay | Admitting: *Deleted

## 2024-03-15 DIAGNOSIS — N3001 Acute cystitis with hematuria: Secondary | ICD-10-CM | POA: Insufficient documentation

## 2024-03-15 DIAGNOSIS — R319 Hematuria, unspecified: Secondary | ICD-10-CM | POA: Diagnosis present

## 2024-03-15 DIAGNOSIS — R7989 Other specified abnormal findings of blood chemistry: Secondary | ICD-10-CM | POA: Insufficient documentation

## 2024-03-15 LAB — URINALYSIS, W/ REFLEX TO CULTURE (INFECTION SUSPECTED)
Bilirubin Urine: NEGATIVE
Glucose, UA: NEGATIVE mg/dL
Ketones, ur: NEGATIVE mg/dL
Nitrite: NEGATIVE
Protein, ur: 100 mg/dL — AB
RBC / HPF: >50 RBC/hpf (ref 0–5)
Specific Gravity, Urine: 1.021 (ref 1.005–1.030)
WBC, UA: >50 WBC/hpf (ref 0–5)
pH: 5.5 (ref 5.0–8.0)

## 2024-03-15 LAB — CBC WITH DIFFERENTIAL/PLATELET
Abs Immature Granulocytes: 0.03 10*3/uL (ref 0.00–0.07)
Basophils Absolute: 0 10*3/uL (ref 0.0–0.1)
Basophils Relative: 0 %
Eosinophils Absolute: 0.2 10*3/uL (ref 0.0–0.5)
Eosinophils Relative: 3 %
HCT: 30.8 % — ABNORMAL LOW (ref 36.0–46.0)
Hemoglobin: 10.2 g/dL — ABNORMAL LOW (ref 12.0–15.0)
Immature Granulocytes: 1 %
Lymphocytes Relative: 33 %
Lymphs Abs: 1.9 10*3/uL (ref 0.7–4.0)
MCH: 30.6 pg (ref 26.0–34.0)
MCHC: 33.1 g/dL (ref 30.0–36.0)
MCV: 92.5 fL (ref 80.0–100.0)
Monocytes Absolute: 0.8 10*3/uL (ref 0.1–1.0)
Monocytes Relative: 15 %
Neutro Abs: 2.7 10*3/uL (ref 1.7–7.7)
Neutrophils Relative %: 48 %
Platelets: 272 10*3/uL (ref 150–400)
RBC: 3.33 MIL/uL — ABNORMAL LOW (ref 3.87–5.11)
RDW: 12.8 % (ref 11.5–15.5)
WBC: 5.6 10*3/uL (ref 4.0–10.5)
nRBC: 0 % (ref 0.0–0.2)

## 2024-03-15 LAB — BASIC METABOLIC PANEL WITH GFR
Anion gap: 6 (ref 5–15)
BUN: 23 mg/dL (ref 8–23)
CO2: 28 mmol/L (ref 22–32)
Calcium: 9.2 mg/dL (ref 8.9–10.3)
Chloride: 102 mmol/L (ref 98–111)
Creatinine, Ser: 1.08 mg/dL — ABNORMAL HIGH (ref 0.44–1.00)
GFR, Estimated: 48 mL/min — ABNORMAL LOW (ref >60)
Glucose, Bld: 106 mg/dL — ABNORMAL HIGH (ref 70–99)
Potassium: 4.6 mmol/L (ref 3.5–5.1)
Sodium: 136 mmol/L (ref 135–145)

## 2024-03-15 MED ORDER — CEPHALEXIN 500 MG PO CAPS: 500.0000 mg | ORAL_CAPSULE | Freq: Three times a day (TID) | ORAL | 0 refills | Status: AC

## 2024-03-15 MED ORDER — SODIUM CHLORIDE 0.9 % IV SOLN
1.0000 g | Freq: Once | INTRAVENOUS | Status: AC
  Administered 2024-03-15: 1 g via INTRAVENOUS
  Filled 2024-03-15: qty 10

## 2024-03-15 NOTE — ED Provider Notes (Signed)
 [place] [facility] AT [facility] Provider Note   CSN: [id number] Arrival date & time: 03/15/24  2149     History {Add pertinent medical, surgical, social history, OB history to HPI:1} Chief Complaint  Patient presents with   Hematuria    Pama Roskos is a 89 y.o. female.  She is brought in by family who is assisting with the history.  She has had a few weeks of some urinary discomfort and they have noticed some debris in her urine.  Over the last few days it has been more bloody.  She endorses urinary frequency and dysuria.  She also tells me she has a dropped bladder that she thinks is getting worse.  She also injured her right foot and ankle a week ago when she twisted it.  She had x-rays done showing a fracture and she has followed up with [facility] and is supposed to be using a boot to ambulate with.  Family noticed some increased bruising so they want to make sure that is okay.  The history is provided by the patient and a relative.  Hematuria This is a new problem. The current episode started more than 2 days ago. The problem occurs hourly. The problem has not changed since onset.Associated symptoms include abdominal pain. Pertinent negatives include no chest pain, no headaches and no shortness of breath. Nothing aggravates the symptoms. Nothing relieves the symptoms. She has tried nothing for the symptoms. The treatment provided no relief.       Home Medications Prior to Admission medications   Medication Sig Start Date End Date Taking? Authorizing Provider  levothyroxine  (SYNTHROID ) 75 MCG tablet Take 75 mcg by mouth daily before breakfast.   Yes [provider]  spironolactone (ALDACTONE) 25 MG tablet Take 25 mg by mouth daily.   Yes [provider]  acetaminophen  (TYLENOL ) 650 MG CR tablet Take 1 tablet (650 mg total) by mouth as needed for pain. 08/11/18   Maurie Southern, DO  DULoxetine  (CYMBALTA ) 20 MG capsule Take 1 capsule (20 mg  total) by mouth daily. 06/29/19   Koberlein, Junell C, MD  meloxicam (MOBIC) 7.5 MG tablet Take 7.5 mg by mouth daily. 08/07/20   [provider]      Allergies    Carisoprodol, Cyclobenzaprine hcl, and Penicillins    Review of Systems   Review of Systems  Respiratory:  Negative for shortness of breath.   Cardiovascular:  Negative for chest pain.  Gastrointestinal:  Positive for abdominal pain.  Genitourinary:  Positive for hematuria.  Neurological:  Negative for headaches.    Physical Exam Updated Vital Signs BP (!) 155/67 (BP Location: Right Arm)   Pulse 79   Temp (!) 97.1 F (36.2 C) (Oral)   Resp 20   SpO2 100%  Physical Exam Vitals and nursing note reviewed.  Constitutional:      General: She is not in acute distress.    Appearance: Normal appearance. She is well-developed.  HENT:     Head: Normocephalic and atraumatic.  Eyes:     Conjunctiva/sclera: Conjunctivae normal.  Cardiovascular:     Rate and Rhythm: Normal rate and regular rhythm.     Heart sounds: No murmur heard. Pulmonary:     Effort: Pulmonary effort is normal. No respiratory distress.     Breath sounds: Normal breath sounds. No stridor. No wheezing.  Abdominal:     Palpations: Abdomen is soft.     Tenderness: There is no abdominal tenderness. There is no guarding  or rebound.  Musculoskeletal:        General: Tenderness present. No deformity. Normal range of motion.     Cervical back: Neck supple.     Comments: She has some bruising over her distal foot and some tenderness over her fifth metatarsal base.  Ankle is nontender.  Distal pulses motor and sensation intact  Skin:    General: Skin is warm and dry.  Neurological:     General: No focal deficit present.     Mental Status: She is alert.     GCS: GCS eye subscore is 4. GCS verbal subscore is 5. GCS motor subscore is 6.     Sensory: No sensory deficit.     Motor: No weakness.     ED Results / Procedures / Treatments   Labs (all  labs ordered are listed, but only abnormal results are displayed) Labs Reviewed  URINALYSIS, ROUTINE W REFLEX MICROSCOPIC    EKG None  Radiology No results found.  Procedures Procedures  {Document cardiac monitor, telemetry assessment procedure when appropriate:1}  Medications Ordered in ED Medications - No data to display  ED Course/ Medical Decision Making/ A&P   {   Click here for ABCD2, HEART and other calculatorsREFRESH Note before signing :1}                              Medical Decision Making Amount and/or Complexity of Data Reviewed Labs: ordered.   This patient complains of ***; this involves an extensive number of treatment Options and is a complaint that carries with it a high risk of complications and morbidity. The differential includes ***  I ordered, reviewed and interpreted labs, which included *** I ordered medication *** and reviewed PMP when indicated. I ordered imaging studies which included *** and I independently    visualized and interpreted imaging which showed *** Additional history obtained from *** Previous records obtained and reviewed *** I consulted *** and discussed lab and imaging findings and discussed disposition.  Cardiac monitoring reviewed, *** Social determinants considered, *** Critical Interventions: ***  After the interventions stated above, I reevaluated the patient and found *** Admission and further testing considered, ***   {Document critical care time when appropriate:1} {Document review of labs and clinical decision tools ie heart score, Chads2Vasc2 etc:1}  {Document your independent review of radiology images, and any outside records:1} {Document your discussion with family members, caretakers, and with consultants:1} {Document social determinants of health affecting pt's care:1} {Document your decision making why or why not admission, treatments were needed:1} Final Clinical Impression(s) / ED Diagnoses Final  diagnoses:  None    Rx / DC Orders ED Discharge Orders     None

## 2024-03-15 NOTE — Discharge Instructions (Addendum)
 You were seen in the emergency department for pain with urination and blood in your urine.  Your urine showed signs of possible infection and we are starting you on antibiotics.  Please drink plenty of fluids.  Follow-up with your primary care doctor.  Continue to use your boot when ambulating for your foot fracture.  Return if any worsening or concerning symptoms

## 2024-03-15 NOTE — ED Triage Notes (Addendum)
 Pt c/o of blood in urine for the past several weeks. Denies any fevers. States sometimes difficulty urinating. C/o of urinary frequency. Pt is alert and oriented times 3. Hx of uti. Pt has bruising to her right anterior foot area. States this is from a previous injury and was diagnosed with a broken foot. States she has been wearing a boot for this. Did not wear the boot today.

## 2024-03-17 LAB — URINE CULTURE
